# Patient Record
Sex: Female | Born: 1945 | Race: White | Hispanic: No | Smoking: Former smoker
Health system: Southern US, Community
[De-identification: ages and names within clinical notes are randomized; demographics above are authoritative.]

## PROBLEM LIST (undated history)

## (undated) DIAGNOSIS — I2510 Atherosclerotic heart disease of native coronary artery without angina pectoris: Secondary | ICD-10-CM

## (undated) DIAGNOSIS — I4891 Unspecified atrial fibrillation: Secondary | ICD-10-CM

## (undated) DIAGNOSIS — J449 Chronic obstructive pulmonary disease, unspecified: Secondary | ICD-10-CM

## (undated) HISTORY — PX: JOINT REPLACEMENT: SHX530

## (undated) HISTORY — PX: HIP SURGERY: SHX245

## (undated) HISTORY — PX: BACK SURGERY: SHX140

## (undated) HISTORY — DX: Atherosclerotic heart disease of native coronary artery without angina pectoris: I25.10

## (undated) HISTORY — DX: Chronic obstructive pulmonary disease, unspecified: J44.9

## (undated) HISTORY — DX: Unspecified atrial fibrillation: I48.91

---

## 1997-11-12 HISTORY — PX: SPINE SURGERY: SHX786

## 2004-09-07 ENCOUNTER — Ambulatory Visit: Payer: Self-pay | Admitting: Family Medicine

## 2004-11-12 LAB — HM PAP SMEAR

## 2005-11-07 ENCOUNTER — Ambulatory Visit: Payer: Self-pay | Admitting: Family Medicine

## 2005-11-12 HISTORY — PX: CORONARY STENT PLACEMENT: SHX1402

## 2006-11-12 LAB — HM COLONOSCOPY

## 2006-12-17 ENCOUNTER — Ambulatory Visit: Payer: Self-pay | Admitting: Family Medicine

## 2007-02-07 ENCOUNTER — Ambulatory Visit: Payer: Self-pay | Admitting: Family Medicine

## 2007-02-13 ENCOUNTER — Ambulatory Visit: Payer: Self-pay | Admitting: Family Medicine

## 2007-07-15 ENCOUNTER — Ambulatory Visit: Payer: Self-pay | Admitting: Specialist

## 2007-10-14 ENCOUNTER — Ambulatory Visit: Payer: Self-pay | Admitting: Gastroenterology

## 2007-11-17 ENCOUNTER — Ambulatory Visit: Payer: Self-pay | Admitting: Family Medicine

## 2007-12-22 ENCOUNTER — Ambulatory Visit: Payer: Self-pay | Admitting: Family Medicine

## 2008-01-14 ENCOUNTER — Ambulatory Visit: Payer: Self-pay | Admitting: Specialist

## 2008-01-21 ENCOUNTER — Ambulatory Visit: Payer: Self-pay | Admitting: Specialist

## 2008-02-11 ENCOUNTER — Ambulatory Visit: Payer: Self-pay | Admitting: Specialist

## 2008-02-19 ENCOUNTER — Other Ambulatory Visit: Payer: Self-pay

## 2008-02-19 ENCOUNTER — Inpatient Hospital Stay: Payer: Self-pay | Admitting: Internal Medicine

## 2008-03-09 ENCOUNTER — Ambulatory Visit: Payer: Self-pay | Admitting: Family Medicine

## 2008-03-12 ENCOUNTER — Ambulatory Visit: Payer: Self-pay | Admitting: Physician Assistant

## 2008-03-16 ENCOUNTER — Inpatient Hospital Stay: Payer: Self-pay | Admitting: Internal Medicine

## 2008-03-16 ENCOUNTER — Other Ambulatory Visit: Payer: Self-pay

## 2008-03-17 ENCOUNTER — Other Ambulatory Visit: Payer: Self-pay

## 2008-05-18 ENCOUNTER — Ambulatory Visit: Payer: Self-pay | Admitting: Specialist

## 2008-05-19 ENCOUNTER — Ambulatory Visit: Payer: Self-pay | Admitting: Vascular Surgery

## 2008-05-23 ENCOUNTER — Other Ambulatory Visit: Payer: Self-pay

## 2008-05-23 ENCOUNTER — Emergency Department: Payer: Self-pay

## 2008-06-17 ENCOUNTER — Ambulatory Visit: Payer: Self-pay | Admitting: Specialist

## 2008-07-22 ENCOUNTER — Emergency Department: Payer: Self-pay

## 2008-10-06 ENCOUNTER — Ambulatory Visit: Payer: Self-pay | Admitting: Vascular Surgery

## 2008-10-12 ENCOUNTER — Ambulatory Visit: Payer: Self-pay | Admitting: Specialist

## 2009-01-06 IMAGING — US US CAROTID DUPLEX BILAT
1 series · 14 of 24 positions shown · non-contrast
Comparison: none

REASON FOR EXAM: dizziness
COMMENTS:

[Series 1: us carotid duplex bilat · 0.07mm/px · 14 of 38 slices shown]
[im 1/38]
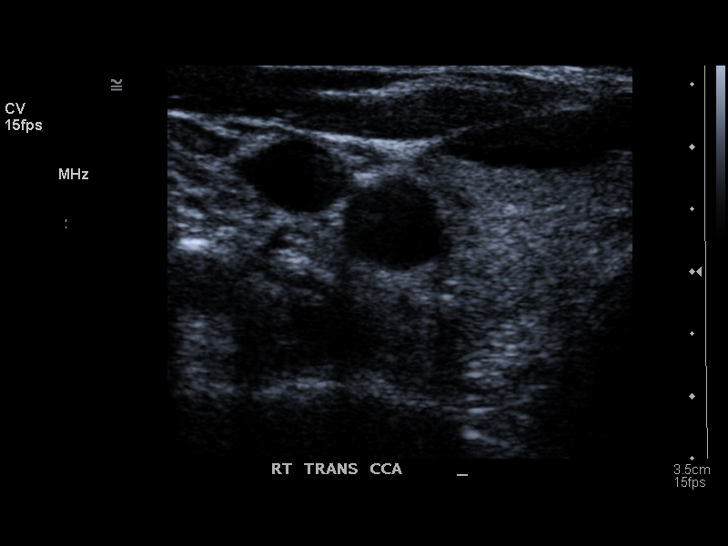
[im 4/38]
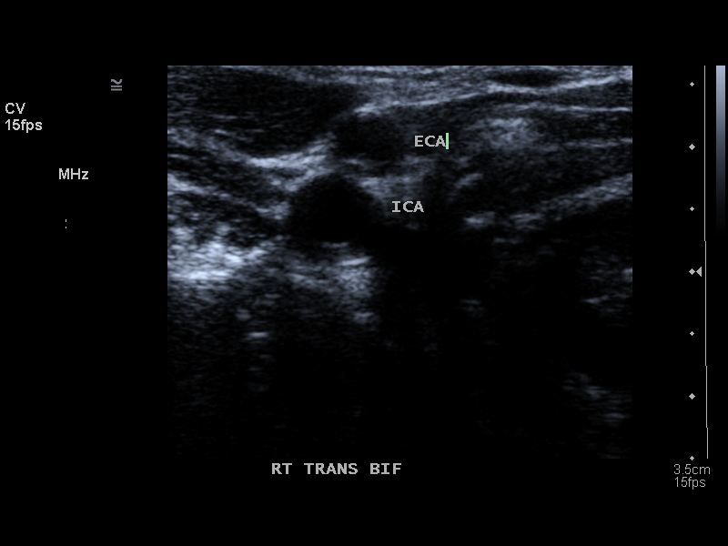
[im 7/38]
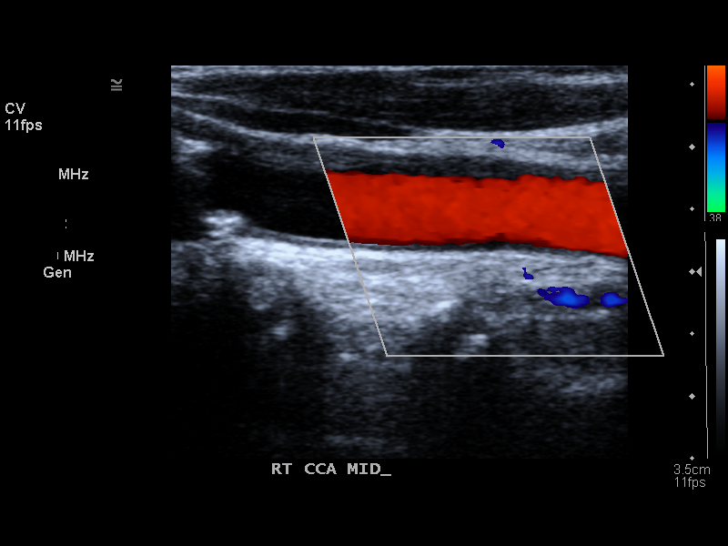
[im 10/38]
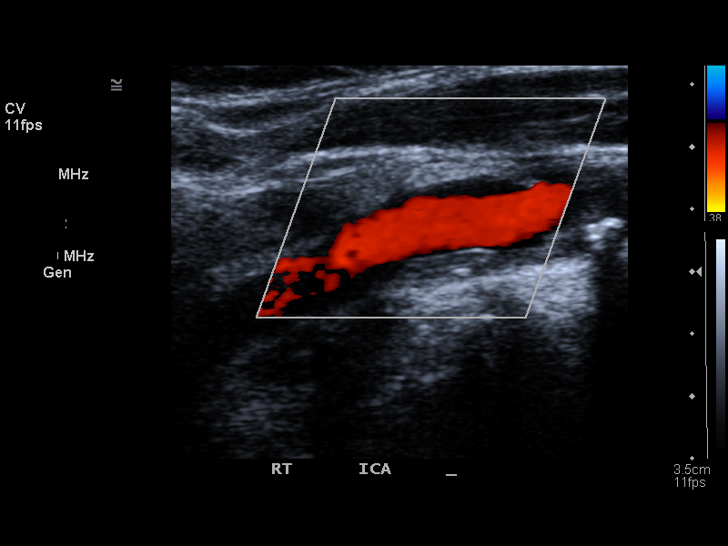
[im 12/38]
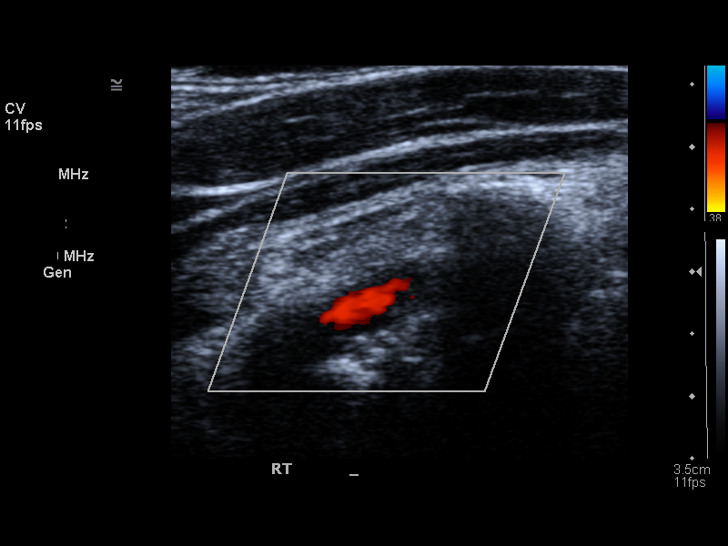
[im 15/38]
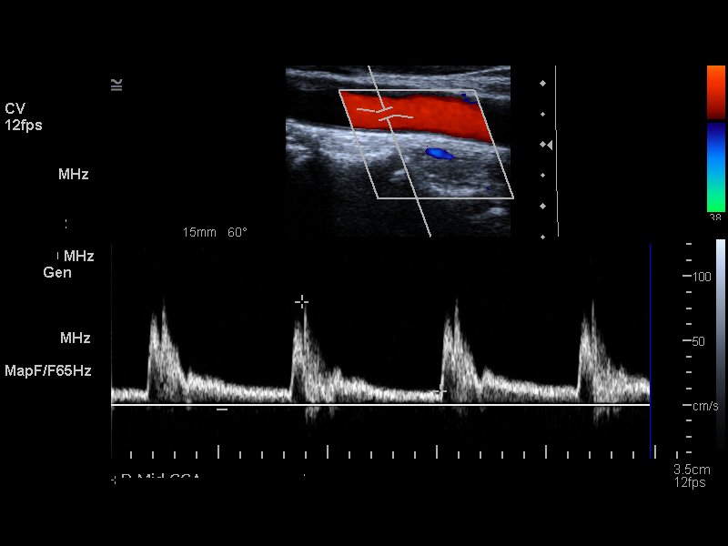
[im 18/38]
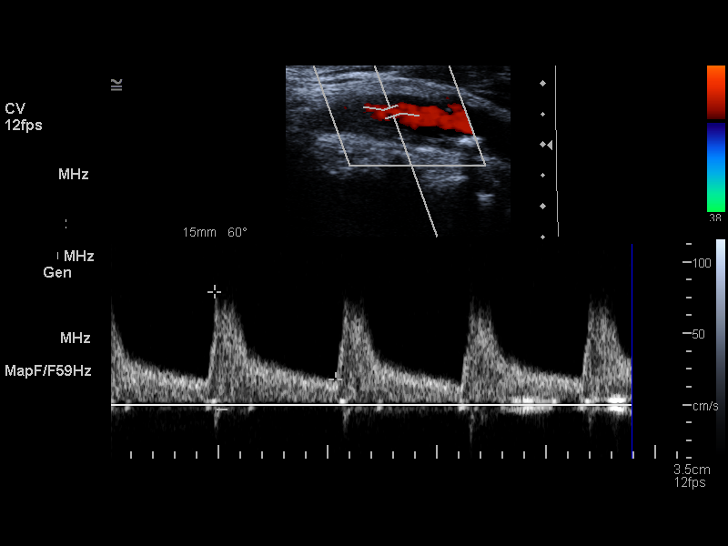
[im 20/38]
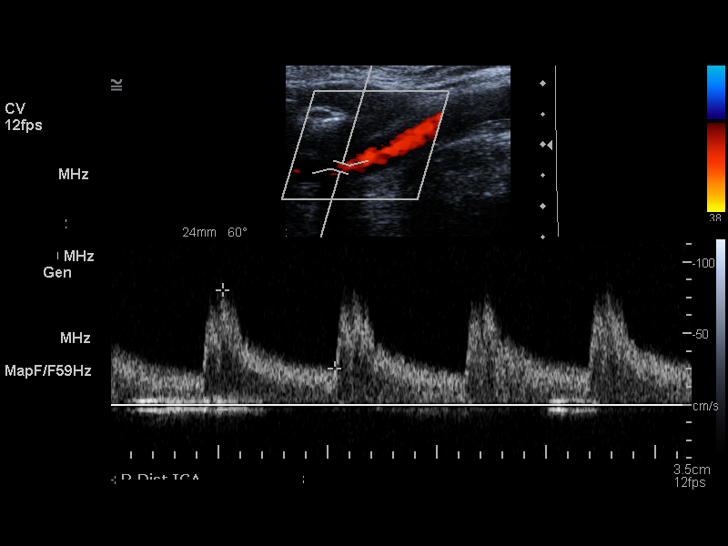
[im 23/38]
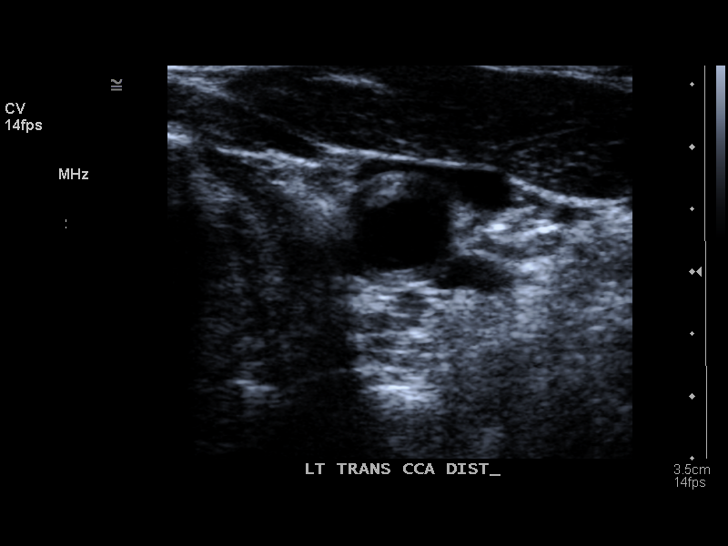
[im 26/38]
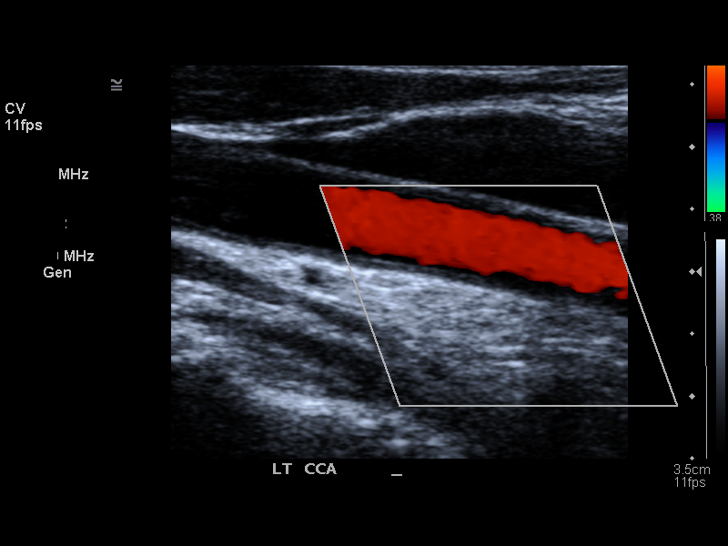
[im 29/38]
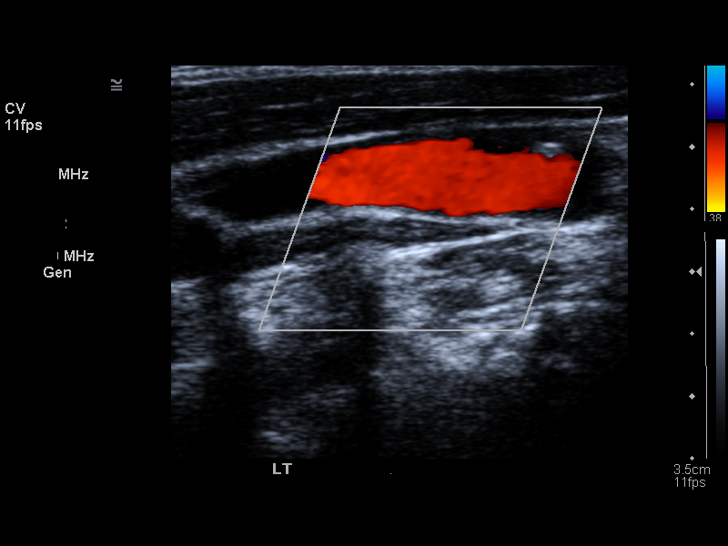
[im 31/38]
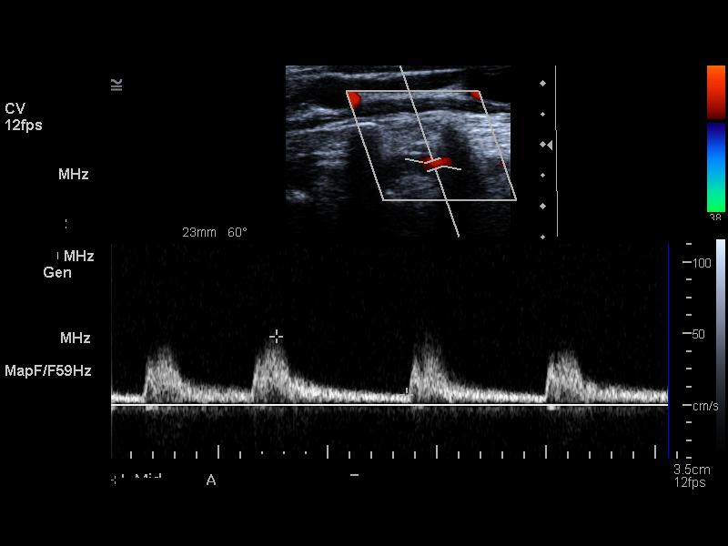
[im 34/38]
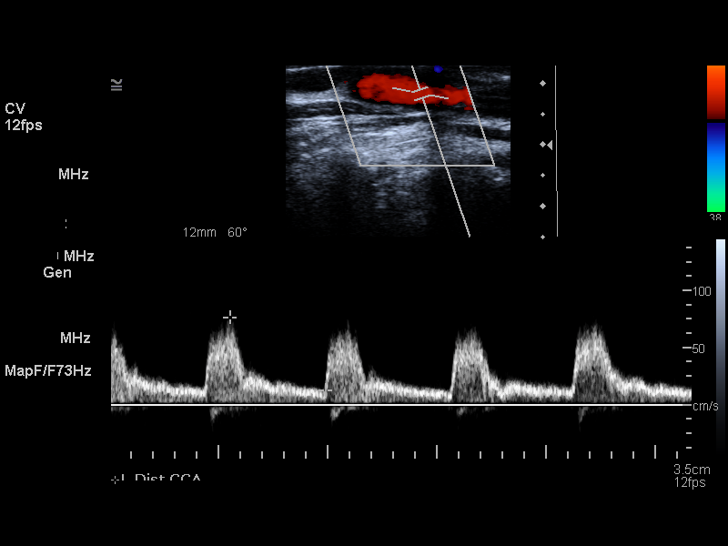
[im 38/38]
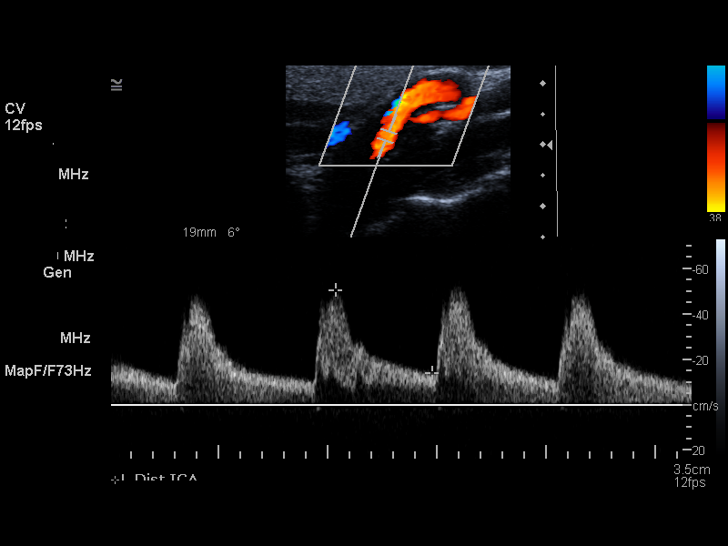

[14 of 24 positions shown; findings below may reference images not displayed]

PROCEDURE:     US  - US CAROTID DOPPLER BILATERAL  - March 09, 2008  [DATE]

RESULT:     On the RIGHT there is calcified and smooth plaque throughout the
carotid system.

On the RIGHT,  peak internal carotid systolic velocity on the RIGHT measured
81 cm/second with peak common carotid velocity of 86 cm/second corresponding
to a ratio of approximately 0.9.

On the LEFT, peak internal carotid systolic velocity measured 81 cm/second
with peak common carotid velocity measuring 102 cm/second corresponding to a
ratio of 0.8. There is a moderate amount of calcified and smooth plaque on
the LEFT in a fashion similar to that seen on the RIGHT. The vertebral
arteries are normal in flow direction bilaterally.
IMPRESSION: I see no evidence of hemodynamically significant carotid stenosis.

## 2009-04-19 ENCOUNTER — Ambulatory Visit: Payer: Self-pay | Admitting: Vascular Surgery

## 2009-09-07 ENCOUNTER — Ambulatory Visit: Payer: Self-pay | Admitting: Specialist

## 2010-02-14 ENCOUNTER — Ambulatory Visit: Payer: Self-pay | Admitting: Family Medicine

## 2010-02-15 ENCOUNTER — Ambulatory Visit: Payer: Self-pay | Admitting: Vascular Surgery

## 2010-03-20 ENCOUNTER — Ambulatory Visit: Payer: Self-pay | Admitting: Family Medicine

## 2010-09-05 ENCOUNTER — Ambulatory Visit: Payer: Self-pay | Admitting: Family Medicine

## 2012-04-26 ENCOUNTER — Ambulatory Visit: Payer: Self-pay | Admitting: Internal Medicine

## 2012-05-20 DIAGNOSIS — Z7901 Long term (current) use of anticoagulants: Secondary | ICD-10-CM | POA: Insufficient documentation

## 2012-09-09 DIAGNOSIS — A310 Pulmonary mycobacterial infection: Secondary | ICD-10-CM | POA: Insufficient documentation

## 2012-09-09 DIAGNOSIS — I739 Peripheral vascular disease, unspecified: Secondary | ICD-10-CM | POA: Insufficient documentation

## 2012-09-09 DIAGNOSIS — E871 Hypo-osmolality and hyponatremia: Secondary | ICD-10-CM | POA: Insufficient documentation

## 2012-09-09 DIAGNOSIS — D649 Anemia, unspecified: Secondary | ICD-10-CM | POA: Insufficient documentation

## 2012-09-09 DIAGNOSIS — I10 Essential (primary) hypertension: Secondary | ICD-10-CM | POA: Insufficient documentation

## 2012-09-09 DIAGNOSIS — I779 Disorder of arteries and arterioles, unspecified: Secondary | ICD-10-CM | POA: Insufficient documentation

## 2012-09-09 DIAGNOSIS — R7309 Other abnormal glucose: Secondary | ICD-10-CM | POA: Insufficient documentation

## 2012-09-09 DIAGNOSIS — I2510 Atherosclerotic heart disease of native coronary artery without angina pectoris: Secondary | ICD-10-CM | POA: Insufficient documentation

## 2012-09-09 DIAGNOSIS — J449 Chronic obstructive pulmonary disease, unspecified: Secondary | ICD-10-CM | POA: Insufficient documentation

## 2012-09-09 DIAGNOSIS — R739 Hyperglycemia, unspecified: Secondary | ICD-10-CM | POA: Insufficient documentation

## 2012-09-09 DIAGNOSIS — R69 Illness, unspecified: Secondary | ICD-10-CM | POA: Insufficient documentation

## 2012-09-09 DIAGNOSIS — Z7689 Persons encountering health services in other specified circumstances: Secondary | ICD-10-CM | POA: Insufficient documentation

## 2012-09-10 DIAGNOSIS — Z0189 Encounter for other specified special examinations: Secondary | ICD-10-CM | POA: Insufficient documentation

## 2012-09-17 ENCOUNTER — Ambulatory Visit: Payer: Self-pay | Admitting: Family Medicine

## 2012-09-23 ENCOUNTER — Ambulatory Visit: Payer: Self-pay | Admitting: Family Medicine

## 2012-09-29 DIAGNOSIS — R928 Other abnormal and inconclusive findings on diagnostic imaging of breast: Secondary | ICD-10-CM | POA: Insufficient documentation

## 2012-11-12 DIAGNOSIS — J449 Chronic obstructive pulmonary disease, unspecified: Secondary | ICD-10-CM

## 2012-11-12 HISTORY — DX: Chronic obstructive pulmonary disease, unspecified: J44.9

## 2012-12-02 ENCOUNTER — Emergency Department: Payer: Self-pay | Admitting: Emergency Medicine

## 2012-12-02 LAB — PROTIME-INR: INR: 4.9

## 2012-12-16 ENCOUNTER — Inpatient Hospital Stay: Payer: Self-pay | Admitting: Internal Medicine

## 2012-12-16 LAB — CBC
HCT: 35.1 % (ref 35.0–47.0)
HGB: 11.9 g/dL — ABNORMAL LOW (ref 12.0–16.0)
MCH: 31.3 pg (ref 26.0–34.0)
MCHC: 34 g/dL (ref 32.0–36.0)
MCV: 92 fL (ref 80–100)
Platelet: 184 10*3/uL (ref 150–440)
RBC: 3.81 10*6/uL (ref 3.80–5.20)
WBC: 12 10*3/uL — ABNORMAL HIGH (ref 3.6–11.0)

## 2012-12-16 LAB — COMPREHENSIVE METABOLIC PANEL
Albumin: 3 g/dL — ABNORMAL LOW (ref 3.4–5.0)
Alkaline Phosphatase: 100 U/L (ref 50–136)
Anion Gap: 9 (ref 7–16)
BUN: 13 mg/dL (ref 7–18)
Calcium, Total: 8.5 mg/dL (ref 8.5–10.1)
Co2: 23 mmol/L (ref 21–32)
EGFR (African American): >60
Glucose: 104 mg/dL — ABNORMAL HIGH (ref 65–99)
Osmolality: 269 (ref 275–301)
SGOT(AST): 22 U/L (ref 15–37)
SGPT (ALT): 40 U/L (ref 12–78)

## 2012-12-16 LAB — PRO B NATRIURETIC PEPTIDE: B-Type Natriuretic Peptide: 2856 pg/mL — ABNORMAL HIGH (ref 0–125)

## 2012-12-16 LAB — TROPONIN I: Troponin-I: <0.02 ng/mL

## 2012-12-16 LAB — CK TOTAL AND CKMB (NOT AT ARMC)
CK, Total: 48 U/L (ref 21–215)
CK-MB: 1.3 ng/mL (ref 0.5–3.6)

## 2012-12-16 LAB — PROTIME-INR: Prothrombin Time: 23.8 secs — ABNORMAL HIGH (ref 11.5–14.7)

## 2012-12-17 LAB — CBC WITH DIFFERENTIAL/PLATELET
Basophil #: 0 10*3/uL (ref 0.0–0.1)
Basophil %: 0.1 %
Eosinophil #: 0 10*3/uL (ref 0.0–0.7)
Eosinophil %: 0 %
HCT: 35.2 % (ref 35.0–47.0)
HGB: 11.8 g/dL — ABNORMAL LOW (ref 12.0–16.0)
Lymphocyte #: 0.6 10*3/uL — ABNORMAL LOW (ref 1.0–3.6)
Lymphocyte %: 4.3 %
MCH: 31 pg (ref 26.0–34.0)
MCHC: 33.6 g/dL (ref 32.0–36.0)
MCV: 92 fL (ref 80–100)
Monocyte #: 0.2 x10 3/mm (ref 0.2–0.9)
Monocyte %: 1.6 %
Neutrophil #: 12.9 10*3/uL — ABNORMAL HIGH (ref 1.4–6.5)
Neutrophil %: 94 %
Platelet: 176 10*3/uL (ref 150–440)
RBC: 3.82 10*6/uL (ref 3.80–5.20)
RDW: 13.7 % (ref 11.5–14.5)
WBC: 13.8 10*3/uL — ABNORMAL HIGH (ref 3.6–11.0)

## 2012-12-17 LAB — BASIC METABOLIC PANEL
Calcium, Total: 8.5 mg/dL (ref 8.5–10.1)
Chloride: 102 mmol/L (ref 98–107)
Co2: 22 mmol/L (ref 21–32)
EGFR (African American): >60
EGFR (Non-African Amer.): 58 — ABNORMAL LOW
Potassium: 4.1 mmol/L (ref 3.5–5.1)
Sodium: 134 mmol/L — ABNORMAL LOW (ref 136–145)

## 2012-12-17 LAB — MAGNESIUM: Magnesium: 2 mg/dL

## 2012-12-17 LAB — PROTIME-INR
INR: 2.1
Prothrombin Time: 23.7 secs — ABNORMAL HIGH (ref 11.5–14.7)

## 2012-12-17 LAB — PRO B NATRIURETIC PEPTIDE: B-Type Natriuretic Peptide: 3825 pg/mL — ABNORMAL HIGH (ref 0–125)

## 2012-12-18 LAB — CBC WITH DIFFERENTIAL/PLATELET
Basophil #: 0 10*3/uL (ref 0.0–0.1)
Basophil %: 0.1 %
Eosinophil #: 0 10*3/uL (ref 0.0–0.7)
Eosinophil %: 0 %
HCT: 37.5 % (ref 35.0–47.0)
HGB: 12.5 g/dL (ref 12.0–16.0)
Lymphocyte #: 0.6 10*3/uL — ABNORMAL LOW (ref 1.0–3.6)
Lymphocyte %: 2.2 %
MCH: 31.1 pg (ref 26.0–34.0)
MCV: 93 fL (ref 80–100)
Monocyte #: 0.5 x10 3/mm (ref 0.2–0.9)
Neutrophil #: 25.4 10*3/uL — ABNORMAL HIGH (ref 1.4–6.5)
Platelet: 225 10*3/uL (ref 150–440)
RBC: 4.02 10*6/uL (ref 3.80–5.20)
RDW: 13.8 % (ref 11.5–14.5)

## 2012-12-18 LAB — BASIC METABOLIC PANEL
BUN: 28 mg/dL — ABNORMAL HIGH (ref 7–18)
Calcium, Total: 8.4 mg/dL — ABNORMAL LOW (ref 8.5–10.1)
Chloride: 97 mmol/L — ABNORMAL LOW (ref 98–107)
Co2: 25 mmol/L (ref 21–32)
Creatinine: 1.12 mg/dL (ref 0.60–1.30)
EGFR (African American): 59 — ABNORMAL LOW
EGFR (Non-African Amer.): 51 — ABNORMAL LOW
Glucose: 273 mg/dL — ABNORMAL HIGH (ref 65–99)
Osmolality: 283 (ref 275–301)
Potassium: 3.5 mmol/L (ref 3.5–5.1)

## 2012-12-18 LAB — PROTIME-INR
INR: 1.9
Prothrombin Time: 21.8 secs — ABNORMAL HIGH (ref 11.5–14.7)

## 2012-12-18 LAB — HEMOGLOBIN A1C: Hemoglobin A1C: 7.2 % — ABNORMAL HIGH (ref 4.2–6.3)

## 2012-12-19 LAB — CBC WITH DIFFERENTIAL/PLATELET
Basophil #: 0 10*3/uL (ref 0.0–0.1)
HGB: 13.1 g/dL (ref 12.0–16.0)
Lymphocyte %: 2.4 %
MCH: 30.3 pg (ref 26.0–34.0)
MCHC: 32.8 g/dL (ref 32.0–36.0)
MCV: 93 fL (ref 80–100)
Monocyte #: 0.3 x10 3/mm (ref 0.2–0.9)
Monocyte %: 1.6 %
Neutrophil #: 20.4 10*3/uL — ABNORMAL HIGH (ref 1.4–6.5)
Platelet: 241 10*3/uL (ref 150–440)
RBC: 4.33 10*6/uL (ref 3.80–5.20)
WBC: 21.2 10*3/uL — ABNORMAL HIGH (ref 3.6–11.0)

## 2012-12-19 LAB — BASIC METABOLIC PANEL
Anion Gap: 8 (ref 7–16)
Calcium, Total: 8.3 mg/dL — ABNORMAL LOW (ref 8.5–10.1)
Chloride: 97 mmol/L — ABNORMAL LOW (ref 98–107)
Co2: 27 mmol/L (ref 21–32)
Creatinine: 0.83 mg/dL (ref 0.60–1.30)
EGFR (Non-African Amer.): >60
Glucose: 209 mg/dL — ABNORMAL HIGH (ref 65–99)
Osmolality: 275 (ref 275–301)
Potassium: 3.8 mmol/L (ref 3.5–5.1)
Sodium: 132 mmol/L — ABNORMAL LOW (ref 136–145)

## 2012-12-19 LAB — PROTIME-INR: INR: 1.8

## 2012-12-22 LAB — CULTURE, BLOOD (SINGLE)

## 2013-03-24 ENCOUNTER — Ambulatory Visit: Payer: Self-pay | Admitting: Family Medicine

## 2013-04-02 ENCOUNTER — Encounter: Payer: Self-pay | Admitting: General Surgery

## 2013-04-02 ENCOUNTER — Ambulatory Visit (INDEPENDENT_AMBULATORY_CARE_PROVIDER_SITE_OTHER): Payer: Medicare Other | Admitting: General Surgery

## 2013-04-02 VITALS — BP 140/72 | HR 76 | Resp 14 | Ht 61.0 in | Wt 174.0 lb

## 2013-04-02 DIAGNOSIS — Z803 Family history of malignant neoplasm of breast: Secondary | ICD-10-CM

## 2013-04-02 DIAGNOSIS — N63 Unspecified lump in unspecified breast: Secondary | ICD-10-CM | POA: Insufficient documentation

## 2013-04-02 NOTE — Progress Notes (Signed)
Patient ID: [name], female   DOB: 1946-10-30, 67 y.o.   MRN: [id number]  Chief Complaint  Patient presents with  . Breast Problem    abnormal mammogram    HPI [name] is a 67 y.o. female who presents for an abnormal mammogram. The most recent mammogram was done on 03/24/13 with a birad category 4. A left breast ultrasound was also performed at this time. The patient occasionally does self breast checks but does get regular mammograms. She has a sister who was diagnosed in her 97's with breast cancer. No complaints at this time. No prior problems with her breasts in the past.  HPI  Past Medical History  Diagnosis Date  . COPD (chronic obstructive pulmonary disease) Jan 2014  . A-fib   . Coronary artery disease     Past Surgical History  Procedure Laterality Date  . Spine surgery  1999  . Joint replacement  1994, 1996, 2000    hip  . Coronary stent placement  2007    Family History  Problem Relation Age of Onset  . Heart failure Father   . Stroke Mother   . Cancer Sister 79    breast    Social History History  Substance Use Topics  . Smoking status: Former Games developer  . Smokeless tobacco: Not on file  . Alcohol Use: No    Allergies  Allergen Reactions  . Penicillins Rash    Current Outpatient Prescriptions  Medication Sig Dispense Refill  . albuterol (PROVENTIL) (2.5 MG/3ML) 0.083% nebulizer solution Take 2.5 mg by nebulization every 6 (six) hours as needed for wheezing.      Marland Kitchen CARTIA XT 120 MG 24 hr capsule 120 mg daily.       . furosemide (LASIX) 20 MG tablet daily.       Marland Kitchen lisinopril (PRINIVIL,ZESTRIL) 40 MG tablet daily.       . metoprolol (LOPRESSOR) 100 MG tablet 100 mg 2 (two) times daily.       Docia Barrier IN Inhale into the lungs. 2 liters of oxygen.      . potassium chloride SA (K-DUR,KLOR-CON) 20 MEQ tablet 20 mEq 2 (two) times daily.       . pravastatin (PRAVACHOL) 80 MG tablet 80 mg daily.       Marland Kitchen SPIRIVA HANDIHALER 18 MCG inhalation capsule        . warfarin (COUMADIN) 2 MG tablet Take 2 mg by mouth 4 (four) times a week.      . warfarin (COUMADIN) 3 MG tablet Take 3 mg by mouth 3 (three) times a week.       No current facility-administered medications for this visit.    Review of Systems Review of Systems  Constitutional: Negative.   Respiratory: Negative.   Cardiovascular: Negative.     Blood pressure 140/72, pulse 76, resp. rate 14, height 5\' 1"  (1.549 m), weight 174 lb (78.926 kg).  Physical Exam Physical Exam  Constitutional: She appears well-developed and well-nourished.  Eyes: Conjunctivae are normal. No scleral icterus.  Neck: Trachea normal. No mass and no thyromegaly present.  Cardiovascular: Normal rate, regular rhythm, normal heart sounds and normal pulses.   No murmur heard. Pulmonary/Chest: Effort normal and breath sounds normal. Right breast exhibits no inverted nipple, no mass, no nipple discharge, no skin change and no tenderness. Left breast exhibits no inverted nipple, no mass, no nipple discharge, no skin change and no tenderness. Breasts are symmetrical.  Abdominal: Soft. Normal appearance. There is no hepatosplenomegaly. There  is no tenderness.  Lymphadenopathy:    She has no cervical adenopathy.    She has no axillary adenopathy.    Data Reviewed Mammogram and ultrasound reviewed showing a 4-5 mm left retroareolar mass at 5 o'cl which has become more prominent compared to prior studies.   Assessment    Feel core biopsy is reasonable at this time. Discussed with patient and she is agreeable.      Plan    Stop Coumadin 5 days prior to procedure.         Fronnie Urton G 04/02/2013, 10:08 AM

## 2013-04-02 NOTE — Patient Instructions (Addendum)
Patient to return for left breast biopsy. The patient is advised to stop Coumadin 5 days prior to biopsy being done.

## 2013-04-16 ENCOUNTER — Ambulatory Visit (INDEPENDENT_AMBULATORY_CARE_PROVIDER_SITE_OTHER): Payer: Medicare Other | Admitting: General Surgery

## 2013-04-16 ENCOUNTER — Encounter: Payer: Self-pay | Admitting: General Surgery

## 2013-04-16 VITALS — BP 130/70 | HR 70 | Resp 16 | Ht 61.0 in | Wt 191.0 lb

## 2013-04-16 DIAGNOSIS — N63 Unspecified lump in unspecified breast: Secondary | ICD-10-CM

## 2013-04-16 NOTE — Patient Instructions (Addendum)
CARE AFTER BREAST BIOPSY  1. Leave the dressing on that your doctor applied after surgery. It is waterproof. You may bathe, shower and/or swim. The dressing will probably remain intact until your return office visit. If the dressing comes off, you will see small strips of tape against your skin on the incision. Do not remove these strips.  2. You may want to use a gauze,cloth or similar protection in your bra to prevent rubbing against your dressing and incision. This is not necessary, but you may feel more comfortable doing so.  3. It is recommended that you wear a bra day and night to give support to the breast. This will prevent the weight of the breast from pulling on the incision.  4. Your breast will feel hard and lumpy under the incision. Do not be alarmed. This is the underlying stitching of tissue. Softening of this tissue will occur in time.  5. Make sure you call the office and schedule an appointment in one week after your surgery. The office phone number is [phone]. The [facility] may have already done this for you.  6. You will notice about a week after your office visit that the strips of the tape on your incision will begin to loosen. These may then be removed.  7. Report to your doctor any of the following:  * Severe pain not relieved by your pain medication  *Redness of the incision  * Drainage from the incision  *Fever greater than 101 degrees  restart coumadin tomorrow

## 2013-04-16 NOTE — Progress Notes (Signed)
Patient ID: [name], female   DOB: 1945-12-12, 67 y.o.   MRN: [id number]  Chief Complaint  Patient presents with  . Procedure    left breast biopsy    HPI [name] is a 67 y.o. female here today for an left breast biopsy. She states she did stop her coumadin as instructed. HPI  Past Medical History  Diagnosis Date  . COPD (chronic obstructive pulmonary disease) Jan 2014  . A-fib   . Coronary artery disease     Past Surgical History  Procedure Laterality Date  . Spine surgery  1999  . Joint replacement  1994, 1996, 2000    hip  . Coronary stent placement  2007    Family History  Problem Relation Age of Onset  . Heart failure Father   . Stroke Mother   . Cancer Sister 49    breast    Social History History  Substance Use Topics  . Smoking status: Former Games developer  . Smokeless tobacco: Not on file  . Alcohol Use: No    Allergies  Allergen Reactions  . Penicillins Rash    Current Outpatient Prescriptions  Medication Sig Dispense Refill  . albuterol (PROVENTIL) (2.5 MG/3ML) 0.083% nebulizer solution Take 2.5 mg by nebulization every 6 (six) hours as needed for wheezing.      Marland Kitchen CARTIA XT 120 MG 24 hr capsule 120 mg daily.       . furosemide (LASIX) 20 MG tablet daily.       Marland Kitchen lisinopril (PRINIVIL,ZESTRIL) 40 MG tablet daily.       . metoprolol (LOPRESSOR) 100 MG tablet 100 mg 2 (two) times daily.       Docia Barrier IN Inhale into the lungs. 2 liters of oxygen.      . potassium chloride SA (K-DUR,KLOR-CON) 20 MEQ tablet 20 mEq 2 (two) times daily.       . pravastatin (PRAVACHOL) 80 MG tablet 80 mg daily.       Marland Kitchen SPIRIVA HANDIHALER 18 MCG inhalation capsule       . warfarin (COUMADIN) 2 MG tablet Take 2 mg by mouth 4 (four) times a week.      . warfarin (COUMADIN) 3 MG tablet Take 3 mg by mouth 3 (three) times a week.       No current facility-administered medications for this visit.    Review of Systems Review of Systems  Constitutional: Negative.    Respiratory: Negative.   Cardiovascular: Negative.     Blood pressure 130/70, pulse 70, resp. rate 16, height 5\' 1"  (1.549 m), weight 191 lb (86.637 kg).  Physical Exam Physical Exam  Data Reviewed    Assessment    Left breat mass     Plan    Core biopsy completed        Gabrielle Mester G 04/16/2013, 8:44 AM

## 2013-04-17 LAB — PATHOLOGY

## 2013-04-21 ENCOUNTER — Telehealth: Payer: Self-pay | Admitting: *Deleted

## 2013-04-21 NOTE — Telephone Encounter (Signed)
Patient was notified as instructed. She has an appointment to return in August 2014.

## 2013-04-21 NOTE — Telephone Encounter (Signed)
Message copied by Nicholes Mango on Tue Apr 21, 2013  8:15 AM ------      Message from: Kieth Brightly      Created: Mon Apr 20, 2013  5:53 PM       Please let pt pt know the pathology was normal. 2 mo f/u for left breast US ------

## 2013-04-22 ENCOUNTER — Encounter: Payer: Self-pay | Admitting: General Surgery

## 2013-06-23 ENCOUNTER — Ambulatory Visit (INDEPENDENT_AMBULATORY_CARE_PROVIDER_SITE_OTHER): Payer: Medicare Other | Admitting: General Surgery

## 2013-06-23 ENCOUNTER — Encounter: Payer: Self-pay | Admitting: General Surgery

## 2013-06-23 ENCOUNTER — Other Ambulatory Visit: Payer: Self-pay

## 2013-06-23 VITALS — BP 164/90 | HR 82 | Resp 18 | Ht 62.0 in | Wt 182.0 lb

## 2013-06-23 DIAGNOSIS — N63 Unspecified lump in unspecified breast: Secondary | ICD-10-CM

## 2013-06-23 NOTE — Progress Notes (Signed)
Patient ID: [name], female   DOB: [birth year], 67 y.o.   MRN: [id number]  Chief Complaint  Patient presents with  . Follow-up    2 month follow up left breast ultrasound.     HPI [name] is a 67 y.o. female who presents for a 2 month follow up left breast ultrasound. She had core biopsy of a small right retro areolar mass at 5 o'cl location The patient denies any new problems with the breasts at this time. The   biopsy done on 04/16/13   was benign.    HPI  Past Medical History  Diagnosis Date  . COPD (chronic obstructive pulmonary disease) Jan 2014  . A-fib   . Coronary artery disease     Past Surgical History  Procedure Laterality Date  . Spine surgery  1999  . Joint replacement  1994, 1996, 2000    hip  . Coronary stent placement  2007    Family History  Problem Relation Age of Onset  . Heart failure Father   . Stroke Mother   . Cancer Sister 54    breast    Social History History  Substance Use Topics  . Smoking status: Former Games developer  . Smokeless tobacco: Not on file  . Alcohol Use: No    Allergies  Allergen Reactions  . Penicillins Rash    Current Outpatient Prescriptions  Medication Sig Dispense Refill  . albuterol (PROVENTIL) (2.5 MG/3ML) 0.083% nebulizer solution Take 2.5 mg by nebulization every 6 (six) hours as needed for wheezing.      Marland Kitchen CARTIA XT 120 MG 24 hr capsule 120 mg daily.       . furosemide (LASIX) 20 MG tablet daily.       Marland Kitchen lisinopril (PRINIVIL,ZESTRIL) 40 MG tablet daily.       . metoprolol (LOPRESSOR) 100 MG tablet 100 mg 2 (two) times daily.       Docia Barrier IN Inhale into the lungs. 2 liters of oxygen.      . potassium chloride SA (K-DUR,KLOR-CON) 20 MEQ tablet 20 mEq 2 (two) times daily.       . pravastatin (PRAVACHOL) 80 MG tablet 80 mg daily.       Marland Kitchen SPIRIVA HANDIHALER 18 MCG inhalation capsule       . warfarin (COUMADIN) 2 MG tablet Take 2 mg by mouth 4 (four) times a week.      . warfarin (COUMADIN) 3 MG tablet Take 3  mg by mouth 3 (three) times a week.       No current facility-administered medications for this visit.    Review of Systems Review of Systems  Constitutional: Negative.   Respiratory: Negative.   Cardiovascular: Negative.     Blood pressure 164/90, pulse 82, resp. rate 18, height 5\' 2"  (1.575 m), weight 182 lb (82.555 kg).  Physical Exam Physical Exam  Constitutional: She appears well-developed and well-nourished.  Neck: No mass and no thyromegaly present.  Pulmonary/Chest: Left breast exhibits no inverted nipple, no mass, no nipple discharge, no skin change and no tenderness.  Lymphadenopathy:    She has no cervical adenopathy.    She has no axillary adenopathy.    Data Reviewed Ultrasound today showed no mass 5:00 retroareolar left   Assessment    Benign left breast mass.    Plan    Follow up in November 2014 left breast diagnostic mammogram.       Kieth Brightly 06/24/2013, 5:58 PM

## 2013-06-23 NOTE — Patient Instructions (Addendum)
Patient to return in November 2014 with left breast diagnostic mammogram.

## 2013-06-24 ENCOUNTER — Encounter: Payer: Self-pay | Admitting: General Surgery

## 2013-09-02 DIAGNOSIS — N63 Unspecified lump in unspecified breast: Secondary | ICD-10-CM | POA: Insufficient documentation

## 2013-09-12 LAB — HM MAMMOGRAPHY

## 2013-10-06 ENCOUNTER — Ambulatory Visit: Payer: Self-pay | Admitting: General Surgery

## 2013-10-06 ENCOUNTER — Encounter: Payer: Self-pay | Admitting: General Surgery

## 2013-10-15 ENCOUNTER — Ambulatory Visit (INDEPENDENT_AMBULATORY_CARE_PROVIDER_SITE_OTHER): Payer: Medicare Other | Admitting: General Surgery

## 2013-10-15 ENCOUNTER — Encounter: Payer: Self-pay | Admitting: General Surgery

## 2013-10-15 VITALS — BP 134/72 | HR 82 | Resp 16 | Ht 62.0 in | Wt 188.0 lb

## 2013-10-15 DIAGNOSIS — N63 Unspecified lump in unspecified breast: Secondary | ICD-10-CM

## 2013-10-15 DIAGNOSIS — Z803 Family history of malignant neoplasm of breast: Secondary | ICD-10-CM

## 2013-10-15 NOTE — Progress Notes (Signed)
Patient ID: [name], female   DOB: [birth year], 67 y.o.   MRN: [id number]  Chief Complaint  Patient presents with  . Follow-up    6 month follow up left diagnostic mammogram.     HPI [name] is a 67 y.o. female who presents for a breast evaluation. The most recent mammogram was done on 10/06/13. Patient does perform regular self breast checks and gets regular mammograms done.  The patient denies any problems with her breasts at this time. She had a core biopsy of a tiny left retroareolar nodulefew mos ago-benign  HPI  Past Medical History  Diagnosis Date  . COPD (chronic obstructive pulmonary disease) Jan 2014  . A-fib   . Coronary artery disease     Past Surgical History  Procedure Laterality Date  . Spine surgery  1999  . Joint replacement  1994, 1996, 2000    hip  . Coronary stent placement  2007    Family History  Problem Relation Age of Onset  . Heart failure Father   . Stroke Mother   . Cancer Sister 36    breast    Social History History  Substance Use Topics  . Smoking status: Former Games developer  . Smokeless tobacco: Not on file  . Alcohol Use: No    Allergies  Allergen Reactions  . Penicillins Rash    Current Outpatient Prescriptions  Medication Sig Dispense Refill  . albuterol (PROVENTIL) (2.5 MG/3ML) 0.083% nebulizer solution Take 2.5 mg by nebulization every 6 (six) hours as needed for wheezing.      Marland Kitchen CARTIA XT 120 MG 24 hr capsule 120 mg daily.       . furosemide (LASIX) 20 MG tablet daily.       Marland Kitchen lisinopril (PRINIVIL,ZESTRIL) 40 MG tablet daily.       . metoprolol (LOPRESSOR) 100 MG tablet 100 mg 2 (two) times daily.       Docia Barrier IN Inhale into the lungs. 2 liters of oxygen.      . potassium chloride SA (K-DUR,KLOR-CON) 20 MEQ tablet 20 mEq 2 (two) times daily.       . pravastatin (PRAVACHOL) 80 MG tablet 80 mg daily.       Marland Kitchen SPIRIVA HANDIHALER 18 MCG inhalation capsule       . warfarin (COUMADIN) 2 MG tablet Take 2 mg by mouth 4  (four) times a week.      . warfarin (COUMADIN) 3 MG tablet Take 3 mg by mouth 3 (three) times a week.       No current facility-administered medications for this visit.    Review of Systems Review of Systems  Constitutional: Negative.   Respiratory: Negative.   Cardiovascular: Negative.     Blood pressure 134/72, pulse 82, resp. rate 16, height 5\' 2"  (1.575 m), weight 188 lb (85.276 kg).  Physical Exam Physical Exam  Constitutional: She is oriented to person, place, and time. She appears well-developed and well-nourished.  Eyes: Conjunctivae are normal. No scleral icterus.  Neck: No mass and no thyromegaly present.  Cardiovascular: Normal rate, regular rhythm and normal heart sounds.   Pulmonary/Chest: Breath sounds normal. Right breast exhibits no inverted nipple, no mass, no nipple discharge, no skin change and no tenderness. Left breast exhibits no inverted nipple, no mass, no nipple discharge, no skin change and no tenderness. Breasts are symmetrical.  Lymphadenopathy:    She has no cervical adenopathy.    She has no axillary adenopathy.  Neurological: She is alert and  oriented to person, place, and time.  Skin: Skin is warm and intact.    Data Reviewed Mammogram shows left retroareolar nodule to be smaller with adjacent clip.  Assessment    Benign left breast nodule. FH of breast cancer. Exam stable.     Plan    Return with yearly mammogram.         [name] 10/15/2013, 10:26 AM

## 2013-10-15 NOTE — Patient Instructions (Signed)
Continue monthly self breast exams and call with any problems

## 2014-02-20 ENCOUNTER — Inpatient Hospital Stay: Payer: Self-pay | Admitting: Specialist

## 2014-02-20 LAB — COMPREHENSIVE METABOLIC PANEL
ALK PHOS: 91 U/L
ANION GAP: 3 — AB (ref 7–16)
Albumin: 3.4 g/dL (ref 3.4–5.0)
BILIRUBIN TOTAL: 0.5 mg/dL (ref 0.2–1.0)
BUN: 13 mg/dL (ref 7–18)
CALCIUM: 8 mg/dL — AB (ref 8.5–10.1)
CHLORIDE: 92 mmol/L — AB (ref 98–107)
CREATININE: 0.7 mg/dL (ref 0.60–1.30)
Co2: 34 mmol/L — ABNORMAL HIGH (ref 21–32)
EGFR (African American): >60
EGFR (Non-African Amer.): >60
GLUCOSE: 131 mg/dL — AB (ref 65–99)
Osmolality: 261 (ref 275–301)
POTASSIUM: 3.8 mmol/L (ref 3.5–5.1)
SGOT(AST): 29 U/L (ref 15–37)
SGPT (ALT): 30 U/L (ref 12–78)
Sodium: 129 mmol/L — ABNORMAL LOW (ref 136–145)
TOTAL PROTEIN: 7.6 g/dL (ref 6.4–8.2)

## 2014-02-20 LAB — CBC WITH DIFFERENTIAL/PLATELET
BASOS PCT: 0.9 %
Basophil #: 0.1 10*3/uL (ref 0.0–0.1)
EOS PCT: 0.8 %
Eosinophil #: 0.1 10*3/uL (ref 0.0–0.7)
HCT: 40.5 % (ref 35.0–47.0)
HGB: 12.9 g/dL (ref 12.0–16.0)
Lymphocyte #: 1.3 10*3/uL (ref 1.0–3.6)
Lymphocyte %: 10.3 %
MCH: 30.1 pg (ref 26.0–34.0)
MCHC: 31.9 g/dL — ABNORMAL LOW (ref 32.0–36.0)
MCV: 94 fL (ref 80–100)
MONO ABS: 0.9 x10 3/mm (ref 0.2–0.9)
MONOS PCT: 6.9 %
NEUTROS ABS: 10.6 10*3/uL — AB (ref 1.4–6.5)
Neutrophil %: 81.1 %
Platelet: 176 10*3/uL (ref 150–440)
RBC: 4.3 10*6/uL (ref 3.80–5.20)
RDW: 13.3 % (ref 11.5–14.5)
WBC: 13 10*3/uL — ABNORMAL HIGH (ref 3.6–11.0)

## 2014-02-20 LAB — PROTIME-INR
INR: 2.1
Prothrombin Time: 22.8 secs — ABNORMAL HIGH (ref 11.5–14.7)

## 2014-02-20 LAB — MAGNESIUM: MAGNESIUM: 1.1 mg/dL — AB

## 2014-02-20 LAB — TROPONIN I: Troponin-I: <0.02 ng/mL

## 2014-02-20 LAB — PHOSPHORUS: Phosphorus: 2.6 mg/dL (ref 2.5–4.9)

## 2014-02-20 LAB — PRO B NATRIURETIC PEPTIDE: B-Type Natriuretic Peptide: 2214 pg/mL — ABNORMAL HIGH (ref 0–125)

## 2014-02-21 LAB — BASIC METABOLIC PANEL
Anion Gap: 5 — ABNORMAL LOW (ref 7–16)
BUN: 12 mg/dL (ref 7–18)
Calcium, Total: 7.7 mg/dL — ABNORMAL LOW (ref 8.5–10.1)
Chloride: 88 mmol/L — ABNORMAL LOW (ref 98–107)
Co2: 32 mmol/L (ref 21–32)
Creatinine: 0.56 mg/dL — ABNORMAL LOW (ref 0.60–1.30)
EGFR (African American): >60
GLUCOSE: 183 mg/dL — AB (ref 65–99)
OSMOLALITY: 256 (ref 275–301)
POTASSIUM: 4.1 mmol/L (ref 3.5–5.1)
Sodium: 125 mmol/L — ABNORMAL LOW (ref 136–145)

## 2014-02-21 LAB — CBC WITH DIFFERENTIAL/PLATELET
BASOS PCT: 0.1 %
Basophil #: 0 10*3/uL (ref 0.0–0.1)
Eosinophil #: 0 10*3/uL (ref 0.0–0.7)
Eosinophil %: 0 %
HCT: 38.7 % (ref 35.0–47.0)
HGB: 13.3 g/dL (ref 12.0–16.0)
Lymphocyte #: 0.5 10*3/uL — ABNORMAL LOW (ref 1.0–3.6)
Lymphocyte %: 4.1 %
MCH: 31.8 pg (ref 26.0–34.0)
MCHC: 34.2 g/dL (ref 32.0–36.0)
MCV: 93 fL (ref 80–100)
MONO ABS: 0.1 x10 3/mm — AB (ref 0.2–0.9)
MONOS PCT: 1.1 %
Neutrophil #: 12.4 10*3/uL — ABNORMAL HIGH (ref 1.4–6.5)
Neutrophil %: 94.7 %
Platelet: 184 10*3/uL (ref 150–440)
RBC: 4.16 10*6/uL (ref 3.80–5.20)
RDW: 13.5 % (ref 11.5–14.5)
WBC: 13 10*3/uL — ABNORMAL HIGH (ref 3.6–11.0)

## 2014-02-21 LAB — LIPID PANEL
CHOLESTEROL: 108 mg/dL (ref 0–200)
HDL Cholesterol: 43 mg/dL (ref 40–60)
LDL CHOLESTEROL, CALC: 54 mg/dL (ref 0–100)
TRIGLYCERIDES: 53 mg/dL (ref 0–200)
VLDL CHOLESTEROL, CALC: 11 mg/dL (ref 5–40)

## 2014-02-21 LAB — PROTIME-INR
INR: 1.9
PROTHROMBIN TIME: 21 s — AB (ref 11.5–14.7)

## 2014-02-21 LAB — URINALYSIS, COMPLETE
Bilirubin,UR: NEGATIVE
Blood: NEGATIVE
Glucose,UR: 50 mg/dL (ref 0–75)
Ketone: NEGATIVE
Leukocyte Esterase: NEGATIVE
Nitrite: NEGATIVE
Ph: 6 (ref 4.5–8.0)
RBC,UR: 8 /HPF (ref 0–5)
SPECIFIC GRAVITY: 1.008 (ref 1.003–1.030)
WBC UR: 4 /HPF (ref 0–5)

## 2014-02-21 LAB — MAGNESIUM: MAGNESIUM: 2.1 mg/dL

## 2014-02-21 LAB — TROPONIN I: Troponin-I: <0.02 ng/mL

## 2014-02-22 LAB — PROTIME-INR
INR: 2.4
Prothrombin Time: 25.6 secs — ABNORMAL HIGH (ref 11.5–14.7)

## 2014-02-22 LAB — SODIUM: SODIUM: 131 mmol/L — AB (ref 136–145)

## 2014-02-23 LAB — PROTIME-INR
INR: 2.9
PROTHROMBIN TIME: 29.7 s — AB (ref 11.5–14.7)

## 2014-02-25 LAB — CULTURE, BLOOD (SINGLE)

## 2014-03-08 ENCOUNTER — Ambulatory Visit: Payer: Self-pay | Admitting: Family Medicine

## 2014-03-12 ENCOUNTER — Ambulatory Visit: Payer: Self-pay | Admitting: Family Medicine

## 2014-09-13 ENCOUNTER — Encounter: Payer: Self-pay | Admitting: General Surgery

## 2014-10-11 ENCOUNTER — Ambulatory Visit: Payer: Self-pay | Admitting: General Surgery

## 2014-10-12 ENCOUNTER — Encounter: Payer: Self-pay | Admitting: General Surgery

## 2014-10-19 ENCOUNTER — Encounter: Payer: Self-pay | Admitting: General Surgery

## 2014-10-19 ENCOUNTER — Ambulatory Visit (INDEPENDENT_AMBULATORY_CARE_PROVIDER_SITE_OTHER): Payer: Medicare Other | Admitting: General Surgery

## 2014-10-19 VITALS — BP 142/68 | HR 86 | Resp 18 | Ht 62.0 in | Wt 164.0 lb

## 2014-10-19 DIAGNOSIS — Z803 Family history of malignant neoplasm of breast: Secondary | ICD-10-CM

## 2014-10-19 DIAGNOSIS — N63 Unspecified lump in unspecified breast: Secondary | ICD-10-CM

## 2014-10-19 NOTE — Patient Instructions (Addendum)
Continue self breast exams. Call office for any new breast issues or concerns. 

## 2014-10-19 NOTE — Progress Notes (Signed)
Patient ID: [name], female   DOB: May 27, 1946, 68 y.o.   MRN: [id number]  Chief Complaint  Patient presents with  . Follow-up    mammogram    HPI Savannah Faithda H Dipinto is a 68 y.o. female who presents for a breast evaluation. The most recent mammogram was done on 10/11/14. Patient does perform regular self breast checks and gets regular mammograms done.  The patient denies any new problems with the breasts at this time.    HPI  Past Medical History  Diagnosis Date  . COPD (chronic obstructive pulmonary disease) Jan 2014  . A-fib   . Coronary artery disease     Past Surgical History  Procedure Laterality Date  . Spine surgery  1999  . Joint replacement  1994, 1996, 2000    hip  . Coronary stent placement  2007    Family History  Problem Relation Age of Onset  . Heart failure Father   . Stroke Mother   . Cancer Sister [id number]    breast    Social History History  Substance Use Topics  . Smoking status: Former Games developermoker  . Smokeless tobacco: Not on file  . Alcohol Use: No    Allergies  Allergen Reactions  . Penicillins Rash    Current Outpatient Prescriptions  Medication Sig Dispense Refill  . ACCU-CHEK AVIVA PLUS test strip     . albuterol (PROVENTIL) (2.5 MG/3ML) 0.083% nebulizer solution Take 2.5 mg by nebulization every 6 (six) hours as needed for wheezing.    Marland Kitchen. CARTIA XT 120 MG 24 hr capsule 120 mg daily.     . furosemide (LASIX) 20 MG tablet daily.     Marland Kitchen. lisinopril (PRINIVIL,ZESTRIL) 40 MG tablet daily.     . metFORMIN (GLUCOPHAGE) 500 MG tablet     . metoprolol (LOPRESSOR) 100 MG tablet 100 mg 2 (two) times daily.     . metoprolol succinate (TOPROL-XL) 100 MG 24 hr tablet     . OXYGEN-HELIUM IN Inhale into the lungs. 2 liters of oxygen.    . potassium chloride SA (K-DUR,KLOR-CON) 20 MEQ tablet 20 mEq 2 (two) times daily.     . pravastatin (PRAVACHOL) 80 MG tablet 80 mg daily.     Marland Kitchen. warfarin (COUMADIN) 2 MG tablet Take 4 mg by mouth one time only at 6 PM.     .  warfarin (COUMADIN) 3 MG tablet Take 3 mg by mouth 3 (three) times a week.    . warfarin (COUMADIN) 4 MG tablet      No current facility-administered medications for this visit.    Review of Systems Review of Systems  Constitutional: Negative.   Respiratory: Negative.   Cardiovascular: Negative.     Blood pressure 142/68, pulse 86, resp. rate 18, height 5\' 2"  (1.575 m), weight 164 lb (74.39 kg).  Physical Exam Physical Exam  Constitutional: She is oriented to person, place, and time. She appears well-developed and well-nourished.  Eyes: Conjunctivae are normal. No scleral icterus.  Neck: Neck supple. No thyromegaly present.  Cardiovascular: Normal rate, regular rhythm and normal heart sounds.   No murmur heard. Pulmonary/Chest: Effort normal and breath sounds normal. Right breast exhibits no inverted nipple, no mass, no nipple discharge, no skin change and no tenderness. Left breast exhibits no inverted nipple, no mass, no nipple discharge, no skin change and no tenderness.  Lymphadenopathy:    She has no cervical adenopathy.    She has no axillary adenopathy.  Neurological: She is alert and oriented  to person, place, and time.  Skin: Skin is warm and dry.    Data Reviewed Mammogram reviewed and stable.   Assessment    Stable exam. History of breast mass left. FH of breast cancer.    Plan    Patient will have PCP to follow her and schedule yearly mammograms.     CC: Dr. Fidel LevyJames Hawkins Jr.   SANKAR,SEEPLAPUTHUR G 10/19/2014, 6:07 PM

## 2014-11-29 DIAGNOSIS — R0902 Hypoxemia: Secondary | ICD-10-CM | POA: Diagnosis not present

## 2014-11-29 DIAGNOSIS — Z7901 Long term (current) use of anticoagulants: Secondary | ICD-10-CM | POA: Diagnosis not present

## 2014-11-29 DIAGNOSIS — J449 Chronic obstructive pulmonary disease, unspecified: Secondary | ICD-10-CM | POA: Diagnosis not present

## 2014-11-29 DIAGNOSIS — R05 Cough: Secondary | ICD-10-CM | POA: Diagnosis not present

## 2014-11-29 DIAGNOSIS — R0602 Shortness of breath: Secondary | ICD-10-CM | POA: Diagnosis not present

## 2014-12-09 DIAGNOSIS — J449 Chronic obstructive pulmonary disease, unspecified: Secondary | ICD-10-CM | POA: Diagnosis not present

## 2014-12-13 DIAGNOSIS — I2510 Atherosclerotic heart disease of native coronary artery without angina pectoris: Secondary | ICD-10-CM | POA: Diagnosis not present

## 2014-12-13 DIAGNOSIS — I10 Essential (primary) hypertension: Secondary | ICD-10-CM | POA: Diagnosis not present

## 2014-12-13 DIAGNOSIS — Z7901 Long term (current) use of anticoagulants: Secondary | ICD-10-CM | POA: Diagnosis not present

## 2014-12-13 DIAGNOSIS — N2889 Other specified disorders of kidney and ureter: Secondary | ICD-10-CM | POA: Diagnosis not present

## 2014-12-13 DIAGNOSIS — E785 Hyperlipidemia, unspecified: Secondary | ICD-10-CM | POA: Diagnosis not present

## 2014-12-14 DIAGNOSIS — Z7901 Long term (current) use of anticoagulants: Secondary | ICD-10-CM | POA: Diagnosis not present

## 2014-12-15 DIAGNOSIS — J449 Chronic obstructive pulmonary disease, unspecified: Secondary | ICD-10-CM | POA: Diagnosis not present

## 2014-12-15 DIAGNOSIS — I739 Peripheral vascular disease, unspecified: Secondary | ICD-10-CM | POA: Diagnosis not present

## 2014-12-15 DIAGNOSIS — I480 Paroxysmal atrial fibrillation: Secondary | ICD-10-CM | POA: Diagnosis not present

## 2014-12-15 DIAGNOSIS — E119 Type 2 diabetes mellitus without complications: Secondary | ICD-10-CM | POA: Diagnosis not present

## 2014-12-24 DIAGNOSIS — I517 Cardiomegaly: Secondary | ICD-10-CM | POA: Diagnosis not present

## 2014-12-24 DIAGNOSIS — R05 Cough: Secondary | ICD-10-CM | POA: Diagnosis not present

## 2014-12-24 DIAGNOSIS — J449 Chronic obstructive pulmonary disease, unspecified: Secondary | ICD-10-CM | POA: Diagnosis not present

## 2014-12-24 DIAGNOSIS — R0602 Shortness of breath: Secondary | ICD-10-CM | POA: Diagnosis not present

## 2014-12-24 DIAGNOSIS — I700 Atherosclerosis of aorta: Secondary | ICD-10-CM | POA: Diagnosis not present

## 2014-12-24 DIAGNOSIS — R0902 Hypoxemia: Secondary | ICD-10-CM | POA: Diagnosis not present

## 2014-12-29 DIAGNOSIS — N2889 Other specified disorders of kidney and ureter: Secondary | ICD-10-CM | POA: Diagnosis not present

## 2015-01-09 DIAGNOSIS — J449 Chronic obstructive pulmonary disease, unspecified: Secondary | ICD-10-CM | POA: Diagnosis not present

## 2015-01-14 DIAGNOSIS — J449 Chronic obstructive pulmonary disease, unspecified: Secondary | ICD-10-CM | POA: Diagnosis not present

## 2015-01-14 DIAGNOSIS — R05 Cough: Secondary | ICD-10-CM | POA: Diagnosis not present

## 2015-01-14 DIAGNOSIS — R0902 Hypoxemia: Secondary | ICD-10-CM | POA: Diagnosis not present

## 2015-01-28 DIAGNOSIS — I739 Peripheral vascular disease, unspecified: Secondary | ICD-10-CM | POA: Diagnosis not present

## 2015-01-28 DIAGNOSIS — J449 Chronic obstructive pulmonary disease, unspecified: Secondary | ICD-10-CM | POA: Diagnosis not present

## 2015-01-28 DIAGNOSIS — I10 Essential (primary) hypertension: Secondary | ICD-10-CM | POA: Diagnosis not present

## 2015-01-28 DIAGNOSIS — I701 Atherosclerosis of renal artery: Secondary | ICD-10-CM | POA: Diagnosis not present

## 2015-01-28 DIAGNOSIS — I70213 Atherosclerosis of native arteries of extremities with intermittent claudication, bilateral legs: Secondary | ICD-10-CM | POA: Diagnosis not present

## 2015-02-07 DIAGNOSIS — J449 Chronic obstructive pulmonary disease, unspecified: Secondary | ICD-10-CM | POA: Diagnosis not present

## 2015-02-14 DIAGNOSIS — Z7901 Long term (current) use of anticoagulants: Secondary | ICD-10-CM | POA: Diagnosis not present

## 2015-02-15 DIAGNOSIS — H40003 Preglaucoma, unspecified, bilateral: Secondary | ICD-10-CM | POA: Diagnosis not present

## 2015-02-15 DIAGNOSIS — H2513 Age-related nuclear cataract, bilateral: Secondary | ICD-10-CM | POA: Diagnosis not present

## 2015-02-21 DIAGNOSIS — Z7901 Long term (current) use of anticoagulants: Secondary | ICD-10-CM | POA: Diagnosis not present

## 2015-03-04 NOTE — Discharge Summary (Signed)
PATIENT NAME:  [name], [name] MR#:  [id number] DATE OF BIRTH:  December 16, 1945  DATE OF ADMISSION:  12/16/2012 DATE OF DISCHARGE:  12/19/2012  DISCHARGE DIAGNOSES:  1. Acute chronic obstructive pulmonary disease exacerbation.  2. Acute on chronic diastolic congestive heart failure.  3. Uncontrolled diabetes.  4. Atrial fibrillation with rapid ventricular rate.  5. Hyponatremia.  6. Leukocytosis.   CONSULTS: Dr. Juliann Paresallwood of cardiology.   IMAGING STUDIES DONE: Include chest x-ray, PA and lateral, which showed COPD with superimposed acute bronchitis and mild edema.   ADMITTING HISTORY AND PHYSICAL AND HOSPITAL COURSE: Please see detailed Woods and P dictated by Dr. Mordecai MaesSanchez on 12/16/2012. In brief, a 69 year old female patient with history of severe COPD, on 3 liters oxygen at home, along with atrial fibrillation, on Coumadin, who presented to the hospital with worsening shortness of breath. The patient was found to be in atrial fibrillation with rapid ventricular rate with COPD exacerbation, started on a Cardizem drip and admitted to the hospitalist service.   HOSPITAL COURSE:  1. Atrial fibrillation with rapid ventricular rate. The patient was on Cardizem drip on day 1, which was later stopped after her heart rate was well controlled on oral beta blocker and Cardizem, which was started new. The patient was continued on her Coumadin. The patient does not have any further palpitations. She is in atrial fibrillation, but heart rate is much well controlled between 70 to 90 and is being discharged home in a fair condition.  2. Acute chronic obstructive pulmonary disease exacerbation. The patient was started on IV steroids, nebulizers and antibiotics, with which she improved well. She is back to her baseline of 2 to 3 liters oxygen, with no wheezing on exam, is ambulating well, without any shortness of breath and is being discharged home in a fair condition. The patient will be on a prednisone taper along with  antibiotics and nebulizers around the clock.  3. 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 This should improve as her prednisone is tapered off.   On the day of discharge, the patient's temperature is 98.6, pulse of 88, blood pressure 133/74, saturating 94% on 3 liters oxygen and is being discharged home.   DISCHARGE MEDICATIONS: Include: 1. Cardizem 120 mg oral long-acting once a day.  2. Metoprolol tartrate 100 mg oral 2 times a day.  3. Pravastatin 80 mg oral once a day.  4. Lisinopril 40 mg oral once a day.  5. Daliresp 500 mcg oral once a day.  6. Symbicort 160/4.5 two puffs inhaled 2 times a day.  7. Spiriva 18 mcg inhaled once a day. 8. Albuterol nebulizer every 4 hours as needed for shortness of breath.  9. Combivent 1 puff inhaled 4 times a day.  10. Coumadin 2 mg oral on Sunday, Thursday, Friday, Saturday and 3 mg on Monday and 4 mg on Tuesday and Wednesday.  11. Dextromethorphan and guaifenesin 5 mL oral every 6 hours as needed for cough.  12. Potassium chloride 20 mEq oral once a day.  13. Prednisone 60 mg tapered over 6 days.  14. Lasix 20 mg oral once a day.   DISCHARGE INSTRUCTIONS: The patient will be on a low-sodium diet, daily fluids less than 2 liters. Follow up with Dr. Juliann Paresallwood and primary care physician in 1 to 2 weeks. Continue oxygen 2 liters continuous.   TIME SPENT: Time spent on day of discharge in discharge activity was 45 minutes.  ____________________________ Molinda BailiffSrikar R. Kevion Fatheree, MD srs:OSi D: 12/19/2012 14:58:10 ET T: 12/20/2012  05:55:13 ET JOB#: [id number]  cc: Anjel Perfetti R. Qamar Aughenbaugh, MD, <Dictator> Dwayne D. Juliann Pares, MD Teena Irani. Terance Hart, MD Orie Fisherman MD ELECTRONICALLY SIGNED 12/26/2012 1:02

## 2015-03-04 NOTE — H&P (Signed)
PATIENT NAME:  [name], [name] MR#:  [id number] DATE OF BIRTH:  September 05, 1946  DATE OF ADMISSION:  12/16/2012  PRIMARY CARE PHYSICIAN: Dr. Cay Schillings  REFERRING PHYSICIAN:   Dr. Lowella Fairy   CHIEF COMPLAINT: Increased shortness of breath.   HISTORY OF PRESENT ILLNESS: The patient is a very nice 69 year old female who has history of severe COPD diagnosed in 2008. The patient has been on oxygen nasal cannula at 2 liters at home for about 2 to 3 years. She comes today with a history of increased shortness of breath since last Friday. The patient apparently was treated with Levaquin about 3 or 4 weeks ago, mid January, and had some improvement but no significant resolution. The patient has had shortness of breath, chills and being really out of breath for the past 3 days. She states that she is not able to walk more than 2 or 3 feet without having to stop and catch some air. The patient has had some aching chills and joint pains and rib cage pain for the past week, although she has not had any fever. The patient tells she was having chills but was never feeling any hot, her temperature was always within normal limits. The patient had fevers in mid January whenever she was treated for her COPD exacerbation. The patient states that her cough has been getting worse in the past couple of days with sputum. She always has a cough, and she always has sputum, but now the intensity of the cough is worse and the sputum quantity is larger with change from color clear to yellowish and now is starting to turn a little bit green. The patient was concerned about all this, for which she came to the ER. In the ER, she was given some nebulizer treatments and, unfortunately, because of the nebs she went into atrial fibrillation with RVR with a heart rate that went up to the 140s. Next, the patient was given 2 boluses of Cardizem with 50 mg, and now her heart rate has improved down to 100-110, although she still has significant  tachycardia in the 120s whenever she sits up or moves a little bit more than what he has been. I was asked to admit the patient for control of these symptoms and medical management.   REVIEW OF SYSTEMS:  CONSTITUTIONAL: The patient denies any fever. Positive chronic fatigue. Positive chronic weakness. No significant weight loss or weight gain.  EYES: Denies any blurry vision or changes in her eyesight. No inflammation of her eyes.  ENT: No tinnitus. No significant hearing loss. No postnasal drip. No sinus pain. No difficulty swallowing.  RESPIRATORY: Positive cough, chronic, worsened within the past couple of days, positive wheezing. Negative hemoptysis. Positive dyspnea, especially with exertion. Negative painful respirations. Positive COPD.  CARDIOVASCULAR: No chest pain, no orthopnea, no significant edemas. No arrhythmia. No palpitations, no syncopal episodes.  GASTROINTESTINAL: No nausea, vomiting or diarrhea. No abdominal pain, hematemesis, rectal bleeding or constipation.  GENITOURINARY: No dysuria, hematuria or changes in urinary frequency.  GYNECOLOGIC: No breast masses. No significant history of vaginal bleeding.  ENDOCRINE: No polyuria, polydipsia, or polyphagia. No cold or heat intolerance.  HEMATOLOGIC/LYMPHATIC: No anemia, easy bruising or swollen glands.  MUSCULOSKELETAL: No significant joint deformity or gout. Positive pain on joints and muscles for the past week but overall has not happened today.  NEUROLOGIC: No numbness. No weakness. No TIAs. No CVAs. No ataxia. No seizures.  PSYCHIATRIC: No insomnia or depression.   PAST MEDICAL HISTORY: 1.  Atrial fibrillation diagnosed in 2009.  2. COPD.  3. Hypertension.  4. Osteoarthritis.  5. Osteoporosis.  6. Coronary artery disease, status post stent placement.  7. Renal artery stenosis of the left renal artery, status post renal stent.   PAST SURGICAL HISTORY:  1. Hip replacement.  2. Stent placements in the kidney and the heart.    ALLERGIES: PENICILLIN.   SOCIAL HISTORY: The patient is a former smoker. She quit in 2008. She had a history of 2 to 3 packs per day for over 40 years. She denies any current drinking. She is divorced, lives by herself. She is retired.   FAMILY HISTORY: Positive for significant history of coronary artery disease. Her dad had MI at the age of 11. Her mom had coronary artery disease, a CABG and a CVA. She had a brother who also had a CVA in his 25s. Positive history of breast cancer in her sister. Positive history of dementia, type Alzheimer's, in her mom.  CURRENT MEDICATIONS: Lisinopril 40 mg once daily, clonidine 0.2 mg 3 times a day, warfarin 2 mg on Sundays, Thursdays, Fridays, Saturdays, 2 mg on Monday and 4 mg on Tuesday and Wednesday, Pravastatin 80 mg once a day, metoprolol 100 mg twice daily, Albuterol in solution every 4 hours as needed for shortness of breath, Combivent 1 puff 4 times daily, Spiriva 18 mg once a day, Symbicort 160/4.5 mcg 2 puffs twice daily, amlodipine 5 mg once daily, Daliresp 500 mcg once a day, codeine with guaifenesin syrup p.r.n. cough,   PHYSICAL EXAMINATION: VITAL SIGNS: Blood pressure 146/79, pulse right now around 110, respirations about 24, temperature 98.4, oxygen saturation down to 94% on 2 liters nasal cannula. The patient is alert and oriented x 3, in no acute distress, mild respiratory distress with use of accessory muscles, especially when she talks or moves. No oral lesions. No oropharyngeal exudates. Anicteric sclerae. Pink conjunctivae. No thrush.  NECK: Supple. No JVD. No thyromegaly. No adenopathy. No carotid bruits. No masses. No rigidity.  CARDIOVASCULAR: Irregularly irregular, tachycardic. No murmurs, rubs, or gallops. No displacement of PMI. No tenderness to palpation of anterior chest wall.  LUNGS: The patient has some wheezing diffuse on both lung fields.  There are no signs of consolidation. No tubular sounds. There are no rales, but there might  be some crackles on both bases. Positive use of accessory muscles. No dullness to percussion.  ABDOMEN: Soft, nontender, nondistended. No hepatosplenomegaly. No masses. Bowel sounds are positive.  GENITAL: Deferred.  EXTREMITIES: No significant edema.  No clubbing or cyanosis. Pulses +2. Capillary refill is around 3 seconds.  SKIN: Without any significant rashes or petechiae. Turgor seems to be normal.  NEUROLOGIC: Cranial nerves II through XII are intact. No focal findings. Strength seems to be 5 out of 5 in 4 extremities.  PSYCHIATRIC: Mood is normal without any signs of anxiety or depression. The patient is very corporative, alert and oriented x3.  LYMPHATIC: Negative for lymphadenopathy in the neck or supraclavicular areas.  MUSCULOSKELETAL: No significant or evident joint deformity or joint effusions.   LABORATORY, DIAGNOSTIC AND RADIOLOGICAL DATA:  Her BNP is 2800.  Her glucose is 104, BUN 13, creatinine 0.76, serum sodium is 134, potassium is 3.9. Her total albumin is 3.0. All other LFTs are within normal limits. Cardiac enzymes are negative with a troponin less than 0.02.  Her white blood count is 12,000, her hemoglobin is 11.9, platelets are 184. Her INR is 2.1.  Chest x-ray report read suggested COPD with  superimposed acute bronchitis and/or developing interstitial pneumonia. There might be some elements of low-grade CHF.  EKG: Atrial fibrillation with RVR. No ST depression or elevation.   ASSESSMENT AND PLAN: This is a 69 year old female with history of COPD, atrial fibrillation, coronary artery disease, hypertension, osteoporosis, osteoarthritis. The patient comes with history of a COPD flare-up in the ER.  After nebs, she developed atrial fibrillation with RVR which is a little bit better controlled but is still not resolved.   1. Chronic respiratory failure: The patient is on 2 liters of oxygen at home. She is not requiring any higher oxygen demand, although she seems very short of  breath, and she is using accessory muscles. The patient is hemodynamically stable at this moment.  2. Increased shortness of breath:  Could be related to COPD flare-up but also might be a component of CHF. The patient does not have a history of CHF.  I am going to order an echocardiogram to evaluate this possibility as the patient has severe coronary artery disease. She has not had an echo in a long time. Her cardiologist is Dr. Juliann Paresallwood.  I am going to give her 1 dose of Lasix 40 mg and monitor her BMP in the morning and potassium. At this moment, she does not look significantly fluid overloaded, just pulmonary congested.  3. Atrial fibrillation with rapid ventricular response: The patient likely had her RVR related to the nebs, Albuterol, but also the acute process could be contributing to this. The patient may have beginnings of pneumonia versus acute bronchitis. The patient is going to be started on Levaquin IV.  The patient took Levaquin p.o. about 3 weeks ago, and she is allergic to penicillin, for which we choose Levaquin; and if the patient does not improve or gets worse, we will consider to change the antibiotics to other non-beta lactamics.   4. Hypertension: The patient has a stable blood pressure.  Continue lisinopril and metoprolol.  5. Coronary artery disease: Continue metoprolol.  The patient is not on aspirin as the patient is on warfarin. Anticoagulation with Coumadin.  At this moment her INR is therapeutic.   Other medical problems are stable. I will send a copy of this H and P to Dr. Sheppard PentonWolf, also Dr. Juliann Paresallwood.   CODE STATUS: The patient is a FULL CODE.    TIME SPENT:  I spent about 45 minutes with this admission.   ____________________________ Felipa Furnaceoberto Sanchez Gutierrez, MD rsg:cb D: 12/16/2012 15:44:00 ET T: 12/16/2012 17:11:32 ET JOB#: [id number]  cc: Mickie HillierJack H. Sheppard PentonWolf, MD Dwayne D. Juliann Paresallwood, MD Felipa Furnaceoberto Sanchez Gutierrez, MD, <Dictator>  Shakemia Madera Juanda ChanceSANCHEZ GUTIERRE MD ELECTRONICALLY  SIGNED 12/23/2012 13:47

## 2015-03-04 NOTE — H&P (Signed)
PATIENT NAME:  Savannah FaithDAMS, [name] MR#:  [id number] DATE OF BIRTH:  1946/07/31  DATE OF ADMISSION:  12/16/2012  Addendum  The patient continued to have labile heart rate going into the 120s to 130s for which she is being started on a Cardizem drip by the ER physician. The patient is going to be transferred to the CCU and monitored closely overnight. Hopefully, we can get her off the drip soon. At this moment, she is in stable with a good blood pressure. The patient changed to a full admission.  TIME SPENT WITH THIS PATIENT:  50 minutes.    ____________________________ Felipa Furnaceoberto Sanchez Gutierrez, MD rsg:si D: 12/16/2012 15:47:08 ET T: 12/16/2012 16:07:03 ET JOB#: [id number]  cc: Felipa Furnaceoberto Sanchez Gutierrez, MD, <Dictator> Byrne Capek Juanda ChanceSANCHEZ GUTIERRE MD ELECTRONICALLY SIGNED 12/23/2012 13:46

## 2015-03-05 NOTE — H&P (Signed)
PATIENT NAME:  [name], [name] MR#:  [id number] DATE OF BIRTH:  Jun 09, 1946  DATE OF ADMISSION:  02/20/2014  PRIMARY CARE PHYSICIAN: Dr. Lahoma RockerPancaldo    PULMONOLOGIST:  Dr. Meredeth IdeFleming  CHIEF COMPLAINT: Shortness of breath.   HISTORY OF PRESENT ILLNESS: This is a 69 year old female with history of COPD, on oxygen at home. She does not wear it 24/7, but recently has had to wear it 24/7. She had a rough week with a head cold and she cannot breathe. Today she started coughing up some reddish phlegm. She also had a cold, clammy, sweat. In the ER, she was found to be in respiratory distress requiring BiPAP initially 100%. Since we did not have any ICU beds available, the patient was held in the Emergency Room for a good period of time until the patient had improved. The patient was tapered down on her oxygen requirements, and was brought down to 3 liters. She still was using accessory muscles to breathe. Hospitalist services were contacted for further evaluation.   PAST MEDICAL HISTORY: Atrial fibrillation, COPD, end-stage on oxygen, diabetes, hypertension, osteoarthritis, osteoporosis, coronary artery disease, renal artery stenosis, status post stent of the left renal artery.   PAST SURGICAL HISTORY: Hip replacement, stent in the kidney, cardiac stent, ear surgery.   ALLERGIES: PENICILLIN, which causes a rash.   FAMILY HISTORY: Mother died of heart disease and had a stroke. Father died of heart disease.   SOCIAL HISTORY: Former smoker, quit in 2008. No smoking any more. No alcohol. No drug use. Lives alone. Used to work in [employer] work, and was a Naval architecttruck driver in the past.   MEDICATIONS: As per Restaurant manager, fast foodprescription writer include: albuterol nebulizer 3 mL every 4 hours as needed for shortness of breath, Combivent CFC 1 puff 4 times a day, Cardia XT 180 mg extended release daily, furosemide 20 mg daily, lisinopril 40 mg daily, metoprolol tartrate 100 mg twice a day, potassium chloride 20 mEq twice a day, pravastatin 80 mg  at bedtime, Spiriva 1 inhalation daily, Symbicort 160/4.5, 2 puffs twice a day, warfarin 3 mg on Sunday, Tuesday, Wednesday, Friday and Saturday, 4 mg on Monday and Thursday.   REVIEW OF SYSTEMS:   CONSTITUTIONAL: Positive for cold sweats. No fever. Positive for fatigue.  EYES: She does wear reading glasses.  EARS, NOSE, MOUTH AND THROAT:  Decreased hearing. Positive for runny nose, postnasal drip and sinus pressure. Positive for sore throat. No difficulty swallowing.  CARDIOVASCULAR: No chest pain.  RESPIRATORY: Positive for shortness of breath. Positive for cough. Positive for blood-tinged sputum.  GASTROINTESTINAL: No nausea. No vomiting. No abdominal pain. No diarrhea. No constipation. No bright red blood per rectum. No melena.  GENITOURINARY: No burning on urination. No hematuria.  MUSCULOSKELETAL: No joint pain or muscle pain.  INTEGUMENT: No rashes or eruptions.  NEUROLOGIC: No fainting or blackouts.  PSYCHIATRIC: No anxiety or depression.  ENDOCRINE: No thyroid problems.  HEMATOLOGIC AND LYMPHATIC: No anemia.   PHYSICAL EXAMINATION: VITAL SIGNS: On presentation to the Emergency Room included a temperature of 98.5, pulse 106, respirations 30, blood pressure 202/102, pulse ox 97% and that was on 100% nonrebreather.  GENERAL: Positive for respiratory distress, using accessory muscles to breathe.  EYES: Conjunctivae and lids normal. Pupils equal, round and reactive to light. Extraocular muscles intact. No nystagmus.  EARS, NOSE, MOUTH AND THROAT: Tympanic membranes: No erythema. Nasal mucosa: No erythema.  THROAT: No erythema, no exudate seen. Lips and gums: No lesions.  NECK: No JVD. No bruits. No lymphadenopathy no  thyromegaly. No thyroid nodules palpated.  RESPIRATORY: Positive use of accessory muscles to breathe. Decreased breath sounds bilaterally. Positive expiratory wheeze.  CARDIOVASCULAR: S1, S2, tachycardic. No gallops, rubs, or murmurs heard. Carotid upstroke 2+ bilaterally.  No bruits. Dorsalis pedis pulses 1+ bilaterally. Trace edema of the lower extremity.  ABDOMEN: Soft, nontender. No organomegaly/splenomegaly. Normoactive bowel sounds.  EXTREMITIES: Trace edema. No clubbing. No cyanosis. On oxygen.  NEUROLOGIC: Cranial nerves II through XII grossly intact. Deep tendon reflexes 1+ bilateral lower extremities.  PSYCHIATRIC: The patient is oriented to person, place, and time.  SKIN: No rashes or lesions seen.   LABORATORY AND RADIOLOGICAL DATA: INR 2.1. BNP 2214, phosphorus 2.6. Troponin negative. Magnesium 1.1. Glucose 131, BUN 13, creatinine 0.7, sodium 129, potassium 3.8, chloride 92, CO2 of 34, calcium 8.0. Liver function tests normal range. White blood cell count 13.0, Woods and Woods 12.9 and 40.5, platelet count 176. Venous pH showed a pH of 7.29, pCO2 of 72. Lactic acid 1.9. Chest x-ray:  Stable, mild interstitial change left lower lobe, likely reflecting chronic inflammatory change related to COPD. EKG: Atrial fibrillation, 109 beats per minute, flipped T waves laterally.   ASSESSMENT AND PLAN: 1.  Acute respiratory failure, initially requiring 100% BiPAP to oxygenate. The patient was watched in the Emergency Room, since there were no ICU beds, to make sure she was moving in the right direction. With the help of Respiratory, we were able to get her to nasal cannula 3 liters. I will keep the BiPAP at the bedside at this point.   2.  Chronic obstructive pulmonary disease exacerbation. Will give IV Zithromax daily, Solu-Medrol 125 mg IV stat, and 60 mg q. 6 hours. Continue Spiriva, Symbicort, and nebulizers.   3.  Hypomagnesemia, severe. Will give 4 grams IV magnesium stat.   4.  Atrial fibrillation. Rate is borderline. Continue usual medications. Likely the rate is up secondary to the acute respiratory failure. Coumadin is therapeutic. Will increase Coumadin to 4 mg for today.   5.  Systemic inflammatory response syndrome, with elevated white count, tachycardia  increased respiratory rate. Continue to monitor. Chest x-ray is negative. Will continue Zithromax. Will send off a urinalysis.   6.  Hyponatremia.  Will put on fluid restriction and continue Lasix at this point.   TIME SPENT ON ADMISSION: 55 minutes.   The patient was critically ill upon presentation.      ____________________________ Herschell Dimes. Renae Gloss, MD rjw:mr D: 02/20/2014 21:08:00 ET T: 02/20/2014 22:43:13 ET JOB#: [id number]  cc: Nira Conn, MD Herschell Dimes. Renae Gloss, MD, <Dictator> DR. FLEMING, PULMONARY  Salley Scarlet MD ELECTRONICALLY SIGNED 02/21/2014 19:31

## 2015-03-05 NOTE — Discharge Summary (Signed)
PATIENT NAME:  [name], [name] MR#:  [id number] DATE OF BIRTH:  [birth year]  DATE OF ADMISSION:  02/20/2014 DATE OF DISCHARGE:  02/23/2014  For a detailed note, please see the history and physical done on admission by Dr. Renae GlossWieting.   DIAGNOSES AT DISCHARGE: Is as follows:  1. Fetal chronic respiratory failure due to chronic obstructive pulmonary disease exacerbation. Chronic obstructive pulmonary disease exacerbation.  2. Hypertension.  3. History of chronic atrial fibrillation.  4. Hyperlipidemia.   DISCHARGE DIET: The patient is being discharged on a low-sodium, low-fat diet.   ACTIVITY: As tolerated.   FOLLOWUP:  Dr. Nira ConnAriana Pancaldo, MD in the next 1-2 weeks.   DISCHARGE MEDICATIONS: Metoprolol tartrate 100 mg b.i.d.; Pravachol 80 mg at bedtime; lisinopril 40 mg daily; Symbicort 160/4.5 two puffs b.i.d.; DuoNebs q.i.d. as needed; warfarin 4 mg at bedtime on Mondays and Thursdays; warfarin 3 mg on Sunday, Tuesday, Wednesday, Friday and Saturday; Spiriva 1 puff daily; potassium 20 mEq b.i.d.; Lasix 20 mg daily; Cardizem CD 180 mg daily; prednisone taper starting at 50 mg down to 10 mg over the next 10 days; azithromycin 5 mg daily x 3 days.   PERTINENT STUDIES DONE DURING THE HOSPITAL COURSE: Are as follows: A chest x-ray done on admission showing mild interstitial change left lower lobe, likely reflecting chronic inflammatory change related to COPD.   HOSPITAL COURSE: This is a 69 year old female who presented to the hospital with shortness of breath, wheezing, and cough, and noted to be in respiratory failure secondary to COPD exacerbation.  1. Acute on chronic respiratory failure. This was likely secondary to COPD exacerbation and no evidence of underlying CHF. The patient was treated aggressively with IV steroids, around-the-clock nebulizer treatments, maintained on Advair, Spiriva, and started on empiric Zithromax. After being on aggressive therapy, patient's clinical symptoms have improved.  She presently is being discharged on oral prednisone taper along with a few more days of Zithromax and maintenance of her inhalers and nebulizer treatments. She is already on oxygen at home which she will continue.  2. Chronic obstructive pulmonary disease exacerbation. This was likely secondary to acute bronchitis. Initially, the patient was on BiPAP on admission, quickly taken off of it within the first 24 hours. Although she continued to have significant wheezing and bronchospasm, was treated aggressively with IV steroids, around-the-clock nebulizer treatments, maintained on her Symbicort, Spiriva, and empiric Zithromax. The patient has significantly improved since admission over the past 2-3 days; therefore, currently being discharged on an oral prednisone taper along with oral Zithromax and the maintenance inhalers as stated.  3. Hypertension. The patient remained hemodynamically stable. She will continue her lisinopril and metoprolol.  4. Hyponatremia. 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 She was given some fluids. Her sodium was as low as 125, but was up to as high as 131 on the day prior to discharge.  5. Chronic atrial fibrillation. The patient remained rate controlled in the hospital on her Cardizem, which she will continue. She will also continue her Coumadin and her INR was therapeutic.   DISPOSITION: The patient is being discharged home.   TIME SPENT ON DISCHARGE: 40 minutes.    ____________________________ Rolly PancakeVivek J. Cherlynn KaiserSainani, MD vjs:dd D: 02/23/2014 16:43:59 ET T: 02/23/2014 18:26:10 ET JOB#: [id number]  cc: Rolly PancakeVivek J. Cherlynn KaiserSainani, MD, <Dictator> Nira ConnAriana Pancaldo, MD Houston SirenVIVEK J SAINANI MD ELECTRONICALLY SIGNED 03/08/2014 10:39

## 2015-03-07 DIAGNOSIS — Z7901 Long term (current) use of anticoagulants: Secondary | ICD-10-CM | POA: Diagnosis not present

## 2015-03-10 DIAGNOSIS — J449 Chronic obstructive pulmonary disease, unspecified: Secondary | ICD-10-CM | POA: Diagnosis not present

## 2015-03-16 DIAGNOSIS — R05 Cough: Secondary | ICD-10-CM | POA: Diagnosis not present

## 2015-03-16 DIAGNOSIS — J432 Centrilobular emphysema: Secondary | ICD-10-CM | POA: Diagnosis not present

## 2015-03-16 DIAGNOSIS — Z7901 Long term (current) use of anticoagulants: Secondary | ICD-10-CM | POA: Diagnosis not present

## 2015-03-16 DIAGNOSIS — R0609 Other forms of dyspnea: Secondary | ICD-10-CM | POA: Diagnosis not present

## 2015-03-16 DIAGNOSIS — R0902 Hypoxemia: Secondary | ICD-10-CM | POA: Diagnosis not present

## 2015-03-16 LAB — PROTIME-INR: PROTIME: 20.5 s — AB (ref 10.0–13.8)

## 2015-03-16 LAB — POCT INR: INR: 2 — AB (ref <=1.1)

## 2015-04-03 DIAGNOSIS — M7030 Other bursitis of elbow, unspecified elbow: Secondary | ICD-10-CM | POA: Insufficient documentation

## 2015-04-03 DIAGNOSIS — E785 Hyperlipidemia, unspecified: Secondary | ICD-10-CM | POA: Insufficient documentation

## 2015-04-03 DIAGNOSIS — I2510 Atherosclerotic heart disease of native coronary artery without angina pectoris: Secondary | ICD-10-CM | POA: Insufficient documentation

## 2015-04-03 DIAGNOSIS — I4891 Unspecified atrial fibrillation: Secondary | ICD-10-CM | POA: Insufficient documentation

## 2015-04-03 DIAGNOSIS — I10 Essential (primary) hypertension: Secondary | ICD-10-CM | POA: Insufficient documentation

## 2015-04-03 DIAGNOSIS — Z9229 Personal history of other drug therapy: Secondary | ICD-10-CM | POA: Insufficient documentation

## 2015-04-03 DIAGNOSIS — N2889 Other specified disorders of kidney and ureter: Secondary | ICD-10-CM | POA: Insufficient documentation

## 2015-04-03 DIAGNOSIS — H9190 Unspecified hearing loss, unspecified ear: Secondary | ICD-10-CM | POA: Insufficient documentation

## 2015-04-03 DIAGNOSIS — Z1331 Encounter for screening for depression: Secondary | ICD-10-CM | POA: Insufficient documentation

## 2015-04-03 DIAGNOSIS — Z9189 Other specified personal risk factors, not elsewhere classified: Secondary | ICD-10-CM | POA: Insufficient documentation

## 2015-04-03 DIAGNOSIS — L989 Disorder of the skin and subcutaneous tissue, unspecified: Secondary | ICD-10-CM | POA: Insufficient documentation

## 2015-04-03 DIAGNOSIS — E119 Type 2 diabetes mellitus without complications: Secondary | ICD-10-CM | POA: Insufficient documentation

## 2015-04-03 DIAGNOSIS — Z8669 Personal history of other diseases of the nervous system and sense organs: Secondary | ICD-10-CM | POA: Insufficient documentation

## 2015-04-03 DIAGNOSIS — J309 Allergic rhinitis, unspecified: Secondary | ICD-10-CM | POA: Insufficient documentation

## 2015-04-03 DIAGNOSIS — H7290 Unspecified perforation of tympanic membrane, unspecified ear: Secondary | ICD-10-CM | POA: Insufficient documentation

## 2015-04-09 DIAGNOSIS — J449 Chronic obstructive pulmonary disease, unspecified: Secondary | ICD-10-CM | POA: Diagnosis not present

## 2015-04-15 ENCOUNTER — Ambulatory Visit (INDEPENDENT_AMBULATORY_CARE_PROVIDER_SITE_OTHER): Payer: Medicare Other | Admitting: Family Medicine

## 2015-04-15 ENCOUNTER — Encounter: Payer: Self-pay | Admitting: Family Medicine

## 2015-04-15 VITALS — BP 123/74 | HR 80 | Temp 98.0°F | Resp 14 | Ht 62.0 in | Wt 158.4 lb

## 2015-04-15 DIAGNOSIS — E119 Type 2 diabetes mellitus without complications: Secondary | ICD-10-CM | POA: Diagnosis not present

## 2015-04-15 DIAGNOSIS — I10 Essential (primary) hypertension: Secondary | ICD-10-CM

## 2015-04-15 DIAGNOSIS — H938X1 Other specified disorders of right ear: Secondary | ICD-10-CM

## 2015-04-15 DIAGNOSIS — H938X9 Other specified disorders of ear, unspecified ear: Secondary | ICD-10-CM | POA: Insufficient documentation

## 2015-04-15 DIAGNOSIS — Z7901 Long term (current) use of anticoagulants: Secondary | ICD-10-CM | POA: Diagnosis not present

## 2015-04-15 DIAGNOSIS — I482 Chronic atrial fibrillation, unspecified: Secondary | ICD-10-CM

## 2015-04-15 DIAGNOSIS — J449 Chronic obstructive pulmonary disease, unspecified: Secondary | ICD-10-CM

## 2015-04-15 DIAGNOSIS — J431 Panlobular emphysema: Secondary | ICD-10-CM | POA: Diagnosis not present

## 2015-04-15 DIAGNOSIS — E785 Hyperlipidemia, unspecified: Secondary | ICD-10-CM

## 2015-04-15 LAB — CHECK PULSE OXIMETRY WHILE AMBULATING: %SAT: 80

## 2015-04-15 MED ORDER — METOPROLOL TARTRATE 100 MG PO TABS: 100.0000 mg | ORAL_TABLET | Freq: Two times a day (BID) | ORAL | Status: DC

## 2015-04-15 NOTE — Assessment & Plan Note (Signed)
Continue medications and diet.  Await A1c results.

## 2015-04-15 NOTE — Progress Notes (Signed)
Name: [name]   MRN: [id number]    DOB: [birth year]   Date:04/15/2015       Progress Note  Subjective  Chief Complaint  Chief Complaint  Patient presents with  . Hypertension  . Diabetes  . COPD  . Atrial Fibrillation    HPI -  For f/u of HBP, DM, COPD, Hyperlipidemia and anticoagulation for chronic a. Fib.  O2 level usually in 80-90 range. Overall feels ok.  Has knot on R. Ear x 1 month.  Getting larger.  Essential (primary) hypertension Continue medications   Diabetes mellitus, type 2 Continue medications and diet.  Await A1c results.   A-fib Continue meds and await INR results.   Chronic airway obstruction Try to keep O2 above 87.  Use O2 at 3L/min. More often (instead of 2L/min).  Check with Dr. Reita Cliche office about low O2s     Past Medical History  Diagnosis Date  . COPD (chronic obstructive pulmonary disease) Jan 2014  . A-fib   . Coronary artery disease     Past Surgical History  Procedure Laterality Date  . Spine surgery  1999  . Joint replacement  1994, 1996, 2000    hip  . Coronary stent placement  2007    Family History  Problem Relation Age of Onset  . Heart failure Father   . Stroke Mother   . Cancer Sister 36    breast    History   Social History  . Marital Status: Divorced    Spouse Name: N/A  . Number of Children: N/A  . Years of Education: N/A   Occupational History  . Not on file.   Social History Main Topics  . Smoking status: Former Smoker -- 2.00 packs/day for 40 years    Types: Cigarettes  . Smokeless tobacco: Never Used  . Alcohol Use: No  . Drug Use: No  . Sexual Activity: Not on file   Other Topics Concern  . Not on file   Social History Narrative     Current outpatient prescriptions:  .  ACCU-CHEK AVIVA PLUS test strip, , Disp: , Rfl:  .  albuterol (PROVENTIL) (2.5 MG/3ML) 0.083% nebulizer solution, Take 2.5 mg by nebulization every 6 (six) hours as needed for wheezing., Disp: , Rfl:  .   budesonide-formoterol (SYMBICORT) 160-4.5 MCG/ACT inhaler, Inhale into the lungs., Disp: , Rfl:  .  CARTIA XT 120 MG 24 hr capsule, 120 mg daily. , Disp: , Rfl:  .  cetirizine (ZYRTEC) 10 MG tablet, Take by mouth., Disp: , Rfl:  .  furosemide (LASIX) 20 MG tablet, daily. , Disp: , Rfl:  .  IPRATROPIUM-ALBUTEROL IN, Inhale into the lungs., Disp: , Rfl:  .  lisinopril (PRINIVIL,ZESTRIL) 40 MG tablet, daily. , Disp: , Rfl:  .  metFORMIN (GLUCOPHAGE) 500 MG tablet, , Disp: , Rfl:  .  metoprolol (LOPRESSOR) 100 MG tablet, Take 1 tablet (100 mg total) by mouth 2 (two) times daily., Disp: 180 tablet, Rfl: 3 .  OXYGEN-HELIUM IN, Inhale into the lungs. 2 liters of oxygen., Disp: , Rfl:  .  potassium chloride SA (K-DUR,KLOR-CON) 20 MEQ tablet, 20 mEq 2 (two) times daily. , Disp: , Rfl:  .  pravastatin (PRAVACHOL) 80 MG tablet, 80 mg daily. , Disp: , Rfl:  .  predniSONE (DELTASONE) 5 MG tablet, , Disp: , Rfl:  .  theophylline (THEODUR) 100 MG 12 hr tablet, TAKE 1 TABLET BY MOUTH TWICE DAILY, Disp: , Rfl:  .  tiotropium (SPIRIVA) 18  MCG inhalation capsule, Place into inhaler and inhale., Disp: , Rfl:  .  warfarin (COUMADIN) 3 MG tablet, Take 3 mg by mouth 3 (three) times a week. 1 tab alternating with 1 1/2 tab every other day\, Disp: , Rfl:  .  warfarin (COUMADIN) 2 MG tablet, Take 4 mg by mouth one time only at 6 PM. , Disp: , Rfl:  .  warfarin (COUMADIN) 4 MG tablet, , Disp: , Rfl:   Allergies  Allergen Reactions  . Penicillins Rash     Review of Systems  Constitutional: Negative.   HENT:       R. Ear mass  Eyes: Negative.   Respiratory: Positive for cough, shortness of breath and wheezing (occ.).   Cardiovascular: Negative.   Gastrointestinal: Negative.   Musculoskeletal: Negative.   Skin: Negative.   Neurological: Negative.   Endo/Heme/Allergies: Negative.      Objective  Filed Vitals:   04/15/15 0944 04/15/15 1028  BP: 123/74   Pulse: 80   Temp: 98 F (36.7 C)   Resp: 14     Height: 5\' 2"  (1.575 m)   Weight: 158 lb 6.4 oz (71.85 kg)   SpO2: 84% 87%    Physical Exam  Constitutional: She is oriented to person, place, and time and well-developed, well-nourished, and in no distress. No distress.  HENT:  Ears:  Eyes: Conjunctivae are normal. Pupils are equal, round, and reactive to light. No scleral icterus.  Neck: Normal range of motion. Neck supple. No thyromegaly present.  Cardiovascular: An irregularly irregular rhythm present.  Atr. fib.  Pulmonary/Chest: No respiratory distress. She has decreased breath sounds (throughout). She has wheezes (faint throughout).  Abdominal: Soft. There is no tenderness.  Musculoskeletal: Normal range of motion.  Lymphadenopathy:    She has no cervical adenopathy.  Neurological: She is alert and oriented to person, place, and time.       Recent Results (from the past 2160 hour(s))  POCT INR     Status: Abnormal   Collection Time: 03/16/15 12:00 AM  Result Value Ref Range   INR 2.0 (A) .9 - 1.1  Protime-INR     Status: Abnormal   Collection Time: 03/16/15 12:00 AM  Result Value Ref Range   Protime 20.5 (A) 10.0 - 13.8 seconds     Assessment & Plan  Problem List Items Addressed This Visit      Cardiovascular and Mediastinum   A-fib    Continue meds and await INR results.      Relevant Medications   metoprolol (LOPRESSOR) 100 MG tablet   Essential (primary) hypertension - Primary    Continue medications      Relevant Medications   metoprolol (LOPRESSOR) 100 MG tablet   Other Relevant Orders   COMPLETE METABOLIC PANEL WITH GFR     Respiratory   Chronic airway obstruction    Try to keep O2 above 87.  Use O2 at 3L/min. More often (instead of 2L/min).  Check with Dr. Reita ClicheFleming's office about low O2s      Relevant Medications   predniSONE (DELTASONE) 5 MG tablet   IPRATROPIUM-ALBUTEROL IN   Other Relevant Orders   CBC with Differential     Endocrine   Diabetes mellitus, type 2    Continue  medications and diet.  Await A1c results.      Relevant Orders   HgB A1c     Nervous and Auditory   Ear mass     Other   Dyslipidemia   Relevant Orders  Lipid Profile   Encounter for current long-term use of anticoagulants   Relevant Orders   INR/PT      Meds ordered this encounter  Medications  .       .       . metoprolol (LOPRESSOR) 100 MG tablet    Sig: Take 1 tablet (100 mg total) by mouth 2 (two) times daily.    Dispense:  180 tablet    Refill:  3    1. Essential (primary) hypertension  - COMPLETE METABOLIC PANEL WITH GFR  2. Chronic atrial fibrillation   3. Panlobular emphysema  - CBC with Differential  4. Type 2 diabetes mellitus without complication  - HgB A1c  5. Dyslipidemia  - Lipid Profile  6. Encounter for current long-term use of anticoagulants  - INR/PT  7. Ear mass, right- inclusion cyst

## 2015-04-15 NOTE — Assessment & Plan Note (Signed)
Try to keep O2 above 87.  Use O2 at 3L/min. More often (instead of 2L/min).  Check with Dr. Reita ClicheFleming's office about low O2s

## 2015-04-15 NOTE — Progress Notes (Signed)
This encounter was created in error - please disregard.

## 2015-04-15 NOTE — Assessment & Plan Note (Signed)
Continue medications

## 2015-04-15 NOTE — Assessment & Plan Note (Signed)
Continue meds and await INR results.

## 2015-04-19 DIAGNOSIS — J431 Panlobular emphysema: Secondary | ICD-10-CM | POA: Diagnosis not present

## 2015-04-19 DIAGNOSIS — Z7901 Long term (current) use of anticoagulants: Secondary | ICD-10-CM | POA: Diagnosis not present

## 2015-04-19 DIAGNOSIS — E785 Hyperlipidemia, unspecified: Secondary | ICD-10-CM | POA: Diagnosis not present

## 2015-04-19 DIAGNOSIS — E119 Type 2 diabetes mellitus without complications: Secondary | ICD-10-CM | POA: Diagnosis not present

## 2015-04-19 LAB — CBC WITH DIFFERENTIAL/PLATELET
BASOS ABS: 0.1 10*3/uL (ref 0.0–0.2)
Basos: 1 %
EOS (ABSOLUTE): 0.1 10*3/uL (ref 0.0–0.4)
Eos: 1 %
HEMATOCRIT: 36.9 % (ref 34.0–46.6)
Hemoglobin: 12.7 g/dL (ref 11.1–15.9)
IMMATURE GRANS (ABS): 0 10*3/uL (ref 0.0–0.1)
Immature Granulocytes: 0 %
Lymphocytes Absolute: 1 10*3/uL (ref 0.7–3.1)
Lymphs: 8 %
MCH: 31.8 pg (ref 26.6–33.0)
MCHC: 34.4 g/dL (ref 31.5–35.7)
MCV: 92 fL (ref 79–97)
MONOCYTES: 6 %
Monocytes Absolute: 0.8 10*3/uL (ref 0.1–0.9)
NEUTROS ABS: 10.6 10*3/uL — AB (ref 1.4–7.0)
Neutrophils: 84 %
PLATELETS: 294 10*3/uL (ref 150–379)
RBC: 4 x10E6/uL (ref 3.77–5.28)
RDW: 14.7 % (ref 12.3–15.4)
WBC: 12.6 10*3/uL — AB (ref 3.4–10.8)

## 2015-04-19 LAB — PROTIME-INR
INR: 2.2 — ABNORMAL HIGH (ref 0.8–1.2)
Prothrombin Time: 22.8 s — ABNORMAL HIGH (ref 9.1–12.0)

## 2015-04-19 LAB — HEMOGLOBIN A1C
Est. average glucose Bld gHb Est-mCnc: 131 mg/dL
Hgb A1c MFr Bld: 6.2 % — ABNORMAL HIGH (ref 4.8–5.6)

## 2015-04-20 ENCOUNTER — Telehealth: Payer: Self-pay

## 2015-04-20 DIAGNOSIS — D689 Coagulation defect, unspecified: Secondary | ICD-10-CM

## 2015-04-20 LAB — LIPID PANEL
Chol/HDL Ratio: 2.4 ratio units (ref 0.0–4.4)
Cholesterol, Total: 139 mg/dL (ref 100–199)
HDL: 57 mg/dL (ref >39)
LDL Calculated: 62 mg/dL (ref 0–99)
Triglycerides: 100 mg/dL (ref 0–149)
VLDL CHOLESTEROL CAL: 20 mg/dL (ref 5–40)

## 2015-04-20 MED ORDER — WARFARIN SODIUM 3 MG PO TABS: 3.0000 mg | ORAL_TABLET | Freq: Every day | ORAL | Status: DC

## 2015-04-20 MED ORDER — WARFARIN SODIUM 3 MG PO TABS: 3.0000 mg | ORAL_TABLET | ORAL | Status: DC

## 2015-04-20 NOTE — Progress Notes (Signed)
Advised.JH  

## 2015-04-20 NOTE — Telephone Encounter (Signed)
Called in warfarin refill same as before 3 mg daily .[facility]

## 2015-04-22 LAB — CMP14+EGFR
A/G RATIO: 1.5 (ref 1.1–2.5)
ALBUMIN: 4.1 g/dL (ref 3.5–4.8)
ALT: 23 IU/L (ref 0–32)
AST: 18 IU/L (ref 0–40)
Alkaline Phosphatase: 74 IU/L (ref 39–117)
BUN/Creatinine Ratio: 23 (ref 11–26)
BUN: 18 mg/dL (ref 8–27)
Bilirubin Total: <0.2 mg/dL (ref 0.0–1.2)
CO2: 24 mmol/L (ref 18–29)
Calcium: 9.4 mg/dL (ref 8.7–10.3)
Chloride: 95 mmol/L — ABNORMAL LOW (ref 97–108)
Creatinine, Ser: 0.79 mg/dL (ref 0.57–1.00)
GFR, EST AFRICAN AMERICAN: 88 mL/min/{1.73_m2} (ref >59)
GFR, EST NON AFRICAN AMERICAN: 76 mL/min/{1.73_m2} (ref >59)
GLUCOSE: 123 mg/dL — AB (ref 65–99)
Globulin, Total: 2.8 g/dL (ref 1.5–4.5)
Potassium: 4.9 mmol/L (ref 3.5–5.2)
Sodium: 139 mmol/L (ref 134–144)
Total Protein: 6.9 g/dL (ref 6.0–8.5)

## 2015-04-22 LAB — SPECIMEN STATUS REPORT

## 2015-05-10 DIAGNOSIS — J449 Chronic obstructive pulmonary disease, unspecified: Secondary | ICD-10-CM | POA: Diagnosis not present

## 2015-06-01 ENCOUNTER — Telehealth: Payer: Self-pay | Admitting: Family Medicine

## 2015-06-01 ENCOUNTER — Other Ambulatory Visit: Payer: Self-pay | Admitting: Family Medicine

## 2015-06-01 DIAGNOSIS — D699 Hemorrhagic condition, unspecified: Secondary | ICD-10-CM

## 2015-06-01 NOTE — Telephone Encounter (Signed)
Pt called need lab order   Will pick up

## 2015-06-01 NOTE — Telephone Encounter (Signed)
Ready for front to pick her up.

## 2015-06-03 ENCOUNTER — Encounter: Payer: Self-pay | Admitting: Family Medicine

## 2015-06-07 DIAGNOSIS — D699 Hemorrhagic condition, unspecified: Secondary | ICD-10-CM | POA: Diagnosis not present

## 2015-06-07 LAB — PROTIME-INR
INR: 2.6 — ABNORMAL HIGH (ref 0.8–1.2)
PROTHROMBIN TIME: 26.9 s — AB (ref 9.1–12.0)

## 2015-06-09 ENCOUNTER — Telehealth: Payer: Self-pay | Admitting: Family Medicine

## 2015-06-09 DIAGNOSIS — J449 Chronic obstructive pulmonary disease, unspecified: Secondary | ICD-10-CM | POA: Diagnosis not present

## 2015-06-09 DIAGNOSIS — I482 Chronic atrial fibrillation, unspecified: Secondary | ICD-10-CM

## 2015-06-09 NOTE — Telephone Encounter (Signed)
Can you suggest and I will call pt result is in chart.

## 2015-06-09 NOTE — Telephone Encounter (Signed)
Pt called request her lab results. Pt call back # is  [phone].

## 2015-06-10 NOTE — Telephone Encounter (Signed)
Did you see the result note sent on this patient?  Her INR is steady between 2-3 where we want her. Continue current medication regimen and we will recheck next month.

## 2015-06-10 NOTE — Progress Notes (Signed)
Pt.notified

## 2015-06-16 DIAGNOSIS — J439 Emphysema, unspecified: Secondary | ICD-10-CM | POA: Diagnosis not present

## 2015-06-16 DIAGNOSIS — R0609 Other forms of dyspnea: Secondary | ICD-10-CM | POA: Diagnosis not present

## 2015-06-16 DIAGNOSIS — R0902 Hypoxemia: Secondary | ICD-10-CM | POA: Diagnosis not present

## 2015-06-21 DIAGNOSIS — R0902 Hypoxemia: Secondary | ICD-10-CM | POA: Diagnosis not present

## 2015-06-21 DIAGNOSIS — J449 Chronic obstructive pulmonary disease, unspecified: Secondary | ICD-10-CM | POA: Diagnosis not present

## 2015-06-21 DIAGNOSIS — I10 Essential (primary) hypertension: Secondary | ICD-10-CM | POA: Diagnosis not present

## 2015-06-21 DIAGNOSIS — I480 Paroxysmal atrial fibrillation: Secondary | ICD-10-CM | POA: Diagnosis not present

## 2015-07-08 ENCOUNTER — Encounter: Payer: Self-pay | Admitting: Family Medicine

## 2015-07-08 ENCOUNTER — Ambulatory Visit (INDEPENDENT_AMBULATORY_CARE_PROVIDER_SITE_OTHER): Payer: Medicare Other | Admitting: Family Medicine

## 2015-07-08 VITALS — BP 152/82 | HR 80 | Resp 16 | Ht 62.0 in | Wt 157.2 lb

## 2015-07-08 DIAGNOSIS — Z0000 Encounter for general adult medical examination without abnormal findings: Secondary | ICD-10-CM

## 2015-07-08 NOTE — Patient Instructions (Signed)
Health Maintenance  Topic Date Due  . INFLUENZA VACCINE  06/13/2015  . FOOT EXAM  07/19/2015 (Originally 04/06/1956)  . Hepatitis C Screening  02/09/2017 (Originally 1946-01-07)  . ZOSTAVAX  02/09/2018 (Originally 04/05/2005)  . HEMOGLOBIN A1C  10/19/2015  . OPHTHALMOLOGY EXAM  01/11/2016  . URINE MICROALBUMIN  07/07/2016  . MAMMOGRAM  10/11/2016  . COLONOSCOPY  11/12/2016  . TETANUS/TDAP  11/12/2021  . DEXA SCAN  Completed  . PNA vac Low Risk Adult  Completed

## 2015-07-08 NOTE — Progress Notes (Signed)
Patient: [name], Female    DOB: 1946-06-26, 69 y.o.   MRN: [id number] Visit Date: 07/08/2015  Today's Provider: Fidel Levy, MD   Chief Complaint  Patient presents with  . Medicare Wellness    Subsiquent     Subjective:    Annual wellness visit DOSIA [name] is a 69 y.o. female who presents today for her Subsequent Annual Wellness Visit. She feels well. She reports exercising . She reports she is sleeping well.   ----------------------------------------------------------- HPI  Review of Systems  Social History   Social History  . Marital Status: Divorced    Spouse Name: N/A  . Number of Children: N/A  . Years of Education: N/A   Occupational History  . Not on file.   Social History Main Topics  . Smoking status: Former Smoker -- 2.00 packs/day for 40 years    Types: Cigarettes  . Smokeless tobacco: Never Used  . Alcohol Use: No  . Drug Use: No  . Sexual Activity: Not on file   Other Topics Concern  . Not on file   Social History Narrative    Patient Active Problem List   Diagnosis Date Noted  . Ear mass 04/15/2015  . Allergic rhinitis 04/03/2015  . A-fib 04/03/2015  . Bursitis of elbow 04/03/2015  . Diabetes mellitus, type 2 04/03/2015  . Dyslipidemia 04/03/2015  . Essential (primary) hypertension 04/03/2015  . H/O ear disorder 04/03/2015  . Difficulty hearing 04/03/2015  . Ear drum perforation 04/03/2015  . Renal vascular disease 04/03/2015  . Screening for depression 04/03/2015  . At risk for injury 04/03/2015  . Single vessel coronary artery disease 04/03/2015  . Skin lesion 04/03/2015  . History of anticoagulant therapy 04/03/2015  . Breast lump 09/02/2013  . Lump or mass in breast 04/02/2013  . Family history of breast cancer 04/02/2013  . Abnormal mammogram 09/29/2012  . Laboratory examination 09/10/2012  . Anemia 09/09/2012  . Coronary atherosclerosis 09/09/2012  . Chronic airway obstruction 09/09/2012  . Abnormal glucose 09/09/2012   . Hyposmolality and/or hyponatremia 09/09/2012  . Unknown cause of morbidity or mortality 09/09/2012  . 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 Peripheral vascular disease 09/09/2012  . Referral of patient 09/09/2012  . BP (high blood pressure) 09/09/2012  . Blood glucose elevated 09/09/2012  . CAFL (chronic airflow limitation) 09/09/2012  . Encounter for current long-term use of anticoagulants 05/20/2012    Past Surgical History  Procedure Laterality Date  . Spine surgery  1999  . Joint replacement  1994, 1996, 2000    hip  . Coronary stent placement  2007    Her family history includes Cancer (age of onset: 85) in her sister; Heart failure in her father; Stroke in her mother.    Previous Medications   ACCU-CHEK AVIVA PLUS TEST STRIP       ALBUTEROL (PROVENTIL) (2.5 MG/3ML) 0.083% NEBULIZER SOLUTION    Take 2.5 mg by nebulization every 6 (six) hours as needed for wheezing.   BUDESONIDE-FORMOTEROL (SYMBICORT) 160-4.5 MCG/ACT INHALER    Inhale into the lungs.   CETIRIZINE (ZYRTEC) 10 MG TABLET    Take by mouth.   DILTIAZEM (CARDIZEM CD) 180 MG 24 HR CAPSULE    TAKE ONE (1) CAPSULE EACH DAY   FUROSEMIDE (LASIX) 20 MG TABLET    Take by mouth.   IPRATROPIUM-ALBUTEROL IN    Inhale into the lungs.   LISINOPRIL (PRINIVIL,ZESTRIL) 40 MG TABLET    daily.    METFORMIN (GLUCOPHAGE) 500  MG TABLET       METOPROLOL (LOPRESSOR) 100 MG TABLET    Take 1 tablet (100 mg total) by mouth 2 (two) times daily.   OXYGEN-HELIUM IN    Inhale into the lungs. 2 liters of oxygen.   POTASSIUM CHLORIDE SA (K-DUR,KLOR-CON) 20 MEQ TABLET    20 mEq 2 (two) times daily.    PRAVASTATIN (PRAVACHOL) 80 MG TABLET    80 mg daily.    PREDNISONE (DELTASONE) 5 MG TABLET       SIMVASTATIN (ZOCOR) 80 MG TABLET    TAKE ONE TABLET DAILY FOR CHOLESTEROL   THEOPHYLLINE (THEODUR) 100 MG 12 HR TABLET    TAKE 1 TABLET BY MOUTH TWICE DAILY   TIOTROPIUM (SPIRIVA) 18 MCG INHALATION CAPSULE    Place into inhaler and  inhale.   WARFARIN (COUMADIN) 3 MG TABLET    Take 1 tablet (3 mg total) by mouth daily. Every other day take 1.5 tabs by mouth of the 3 mg tabs and then 1tab of  on the other days. Same dose as before.    Patient Care Team: Janeann Forehand., MD as PCP - General (Family Medicine) Seeplaputhur Wynona Luna, MD (General Surgery) Andree Elk, MD (Unknown Physician Specialty)     Objective:   Vitals: BP 152/82 mmHg  Pulse 80  Resp 16  Ht  (1.575 m)  Wt 157 lb 3.2 oz (71.305 kg)  BMI 28.74 kg/m2  SpO2 91%  Physical Exam  Activities of Daily Living In your present state of health, do you have any difficulty performing the following activities: 07/08/2015 07/08/2015  Hearing? [name]  Vision? N N  Difficulty concentrating or making decisions? N N  Walking or climbing stairs? Y Y  Dressing or bathing? N Y  Doing errands, shopping? [name]  Preparing Food and eating ? N N  Using the Toilet? N N  In the past six months, have you accidently leaked urine? N -  Do you have problems with loss of bowel control? Y -  Managing your Medications? N N  Managing your Finances? N N  Housekeeping or managing your Housekeeping? N N    Fall Risk Assessment Fall Risk  07/08/2015 04/15/2015 04/15/2015  Falls in the past year? No No No     Patient reports there are safety devices in place in shower at home.   Depression Screen PHQ 2/9 Scores 07/08/2015 04/15/2015 04/15/2015  PHQ - 2 Score 0 0 0     MMSE MMSE - Mini Mental State Exam 07/08/2015  Orientation to time 5  Orientation to Place 5  Registration 3  Attention/ Calculation 5  Recall 3  Language- name 2 objects 2  Language- repeat 1  Language- follow 3 step command 3  Language- read & follow direction 1  Write a sentence 1  Copy design 1  Total score 30     Assessment & Plan:     Annual Wellness Visit  Reviewed patient's Family Medical History Reviewed and updated list of patient's medical providers Assessment of cognitive  impairment was done Assessed patient's functional ability Established a written schedule for health screening services Health Risk Assessent Completed and Reviewed  Exercise Activities and Dietary recommendations Goals    . Increase physical activity     Stay active by doing what is needed in home and with healthcare.        Immunization History  Administered Date(s) Administered  . Influenza-Unspecified 08/09/2014  . Pneumococcal Conjugate-13 08/31/2014  .  Pneumococcal Polysaccharide-23 11/12/2010    Health Maintenance  Topic Date Due  . INFLUENZA VACCINE  06/13/2015  . FOOT EXAM  07/19/2015 (Originally 04/06/1956)  . Hepatitis C Screening  02/09/2017 (Originally 06-Oct-1946)  . ZOSTAVAX  02/09/2018 (Originally 04/05/2005)  . HEMOGLOBIN A1C  10/19/2015  . OPHTHALMOLOGY EXAM  01/11/2016  . URINE MICROALBUMIN  07/07/2016  . MAMMOGRAM  10/11/2016  . COLONOSCOPY  11/12/2016  . TETANUS/TDAP  11/12/2021  . DEXA SCAN  Completed  . PNA vac Low Risk Adult  Completed     Discussed health benefits of physical activity, and encouraged her to engage in regular exercise appropriate for her age and condition.    ------------------------------------------------------------------------------------------------------------   Problem List Items Addressed This Visit    None       Venora Maples, MD [facility]  07/08/2015

## 2015-07-10 DIAGNOSIS — J449 Chronic obstructive pulmonary disease, unspecified: Secondary | ICD-10-CM | POA: Diagnosis not present

## 2015-07-17 ENCOUNTER — Inpatient Hospital Stay
Admission: EM | Admit: 2015-07-17 | Discharge: 2015-07-21 | DRG: 871 | Disposition: A | Discharge status: Home / Self Care | Payer: Medicare Other | Attending: Internal Medicine | Admitting: Internal Medicine

## 2015-07-17 ENCOUNTER — Encounter: Payer: Self-pay | Admitting: Emergency Medicine

## 2015-07-17 ENCOUNTER — Emergency Department: Payer: Medicare Other

## 2015-07-17 DIAGNOSIS — J441 Chronic obstructive pulmonary disease with (acute) exacerbation: Secondary | ICD-10-CM | POA: Diagnosis not present

## 2015-07-17 DIAGNOSIS — Z823 Family history of stroke: Secondary | ICD-10-CM | POA: Diagnosis not present

## 2015-07-17 DIAGNOSIS — Z803 Family history of malignant neoplasm of breast: Secondary | ICD-10-CM | POA: Diagnosis not present

## 2015-07-17 DIAGNOSIS — I2510 Atherosclerotic heart disease of native coronary artery without angina pectoris: Secondary | ICD-10-CM | POA: Diagnosis not present

## 2015-07-17 DIAGNOSIS — I712 Thoracic aortic aneurysm, without rupture: Secondary | ICD-10-CM | POA: Diagnosis not present

## 2015-07-17 DIAGNOSIS — Z880 Allergy status to penicillin: Secondary | ICD-10-CM

## 2015-07-17 DIAGNOSIS — A419 Sepsis, unspecified organism: Secondary | ICD-10-CM | POA: Diagnosis present

## 2015-07-17 DIAGNOSIS — I482 Chronic atrial fibrillation: Secondary | ICD-10-CM | POA: Diagnosis present

## 2015-07-17 DIAGNOSIS — Z79899 Other long term (current) drug therapy: Secondary | ICD-10-CM

## 2015-07-17 DIAGNOSIS — J479 Bronchiectasis, uncomplicated: Secondary | ICD-10-CM | POA: Diagnosis not present

## 2015-07-17 DIAGNOSIS — Z87891 Personal history of nicotine dependence: Secondary | ICD-10-CM | POA: Diagnosis not present

## 2015-07-17 DIAGNOSIS — J449 Chronic obstructive pulmonary disease, unspecified: Secondary | ICD-10-CM | POA: Diagnosis not present

## 2015-07-17 DIAGNOSIS — R0902 Hypoxemia: Secondary | ICD-10-CM | POA: Diagnosis not present

## 2015-07-17 DIAGNOSIS — J069 Acute upper respiratory infection, unspecified: Secondary | ICD-10-CM | POA: Diagnosis present

## 2015-07-17 DIAGNOSIS — Z7901 Long term (current) use of anticoagulants: Secondary | ICD-10-CM | POA: Diagnosis not present

## 2015-07-17 DIAGNOSIS — R600 Localized edema: Secondary | ICD-10-CM | POA: Diagnosis present

## 2015-07-17 DIAGNOSIS — Z9889 Other specified postprocedural states: Secondary | ICD-10-CM | POA: Diagnosis not present

## 2015-07-17 DIAGNOSIS — E222 Syndrome of inappropriate secretion of antidiuretic hormone: Secondary | ICD-10-CM | POA: Diagnosis present

## 2015-07-17 DIAGNOSIS — E875 Hyperkalemia: Secondary | ICD-10-CM | POA: Diagnosis not present

## 2015-07-17 DIAGNOSIS — Z96649 Presence of unspecified artificial hip joint: Secondary | ICD-10-CM | POA: Diagnosis present

## 2015-07-17 DIAGNOSIS — J9611 Chronic respiratory failure with hypoxia: Secondary | ICD-10-CM | POA: Diagnosis not present

## 2015-07-17 DIAGNOSIS — R0602 Shortness of breath: Secondary | ICD-10-CM | POA: Diagnosis not present

## 2015-07-17 DIAGNOSIS — E119 Type 2 diabetes mellitus without complications: Secondary | ICD-10-CM | POA: Diagnosis present

## 2015-07-17 DIAGNOSIS — J9621 Acute and chronic respiratory failure with hypoxia: Secondary | ICD-10-CM | POA: Diagnosis present

## 2015-07-17 DIAGNOSIS — J189 Pneumonia, unspecified organism: Secondary | ICD-10-CM

## 2015-07-17 DIAGNOSIS — Z8249 Family history of ischemic heart disease and other diseases of the circulatory system: Secondary | ICD-10-CM | POA: Diagnosis not present

## 2015-07-17 DIAGNOSIS — Z955 Presence of coronary angioplasty implant and graft: Secondary | ICD-10-CM | POA: Diagnosis not present

## 2015-07-17 DIAGNOSIS — I701 Atherosclerosis of renal artery: Secondary | ICD-10-CM | POA: Diagnosis not present

## 2015-07-17 DIAGNOSIS — E785 Hyperlipidemia, unspecified: Secondary | ICD-10-CM | POA: Diagnosis present

## 2015-07-17 DIAGNOSIS — E871 Hypo-osmolality and hyponatremia: Secondary | ICD-10-CM | POA: Diagnosis not present

## 2015-07-17 DIAGNOSIS — Z9981 Dependence on supplemental oxygen: Secondary | ICD-10-CM | POA: Diagnosis not present

## 2015-07-17 DIAGNOSIS — J159 Unspecified bacterial pneumonia: Secondary | ICD-10-CM | POA: Diagnosis not present

## 2015-07-17 DIAGNOSIS — I10 Essential (primary) hypertension: Secondary | ICD-10-CM | POA: Diagnosis not present

## 2015-07-17 DIAGNOSIS — J180 Bronchopneumonia, unspecified organism: Secondary | ICD-10-CM | POA: Diagnosis not present

## 2015-07-17 DIAGNOSIS — J181 Lobar pneumonia, unspecified organism: Secondary | ICD-10-CM | POA: Diagnosis not present

## 2015-07-17 DIAGNOSIS — R05 Cough: Secondary | ICD-10-CM | POA: Diagnosis not present

## 2015-07-17 LAB — BASIC METABOLIC PANEL
ANION GAP: 9 (ref 5–15)
BUN: 15 mg/dL (ref 6–20)
CHLORIDE: 86 mmol/L — AB (ref 101–111)
CO2: 29 mmol/L (ref 22–32)
Calcium: 8.4 mg/dL — ABNORMAL LOW (ref 8.9–10.3)
Creatinine, Ser: 0.59 mg/dL (ref 0.44–1.00)
GFR calc Af Amer: >60 mL/min (ref >60)
Glucose, Bld: 139 mg/dL — ABNORMAL HIGH (ref 65–99)
POTASSIUM: 4.7 mmol/L (ref 3.5–5.1)
SODIUM: 124 mmol/L — AB (ref 135–145)

## 2015-07-17 LAB — CBC
HEMATOCRIT: 36.2 % (ref 35.0–47.0)
HEMOGLOBIN: 12.2 g/dL (ref 12.0–16.0)
MCH: 31.2 pg (ref 26.0–34.0)
MCHC: 33.8 g/dL (ref 32.0–36.0)
MCV: 92.2 fL (ref 80.0–100.0)
Platelets: 274 10*3/uL (ref 150–440)
RBC: 3.92 MIL/uL (ref 3.80–5.20)
RDW: 14.3 % (ref 11.5–14.5)
WBC: 14.2 10*3/uL — AB (ref 3.6–11.0)

## 2015-07-17 LAB — PROTIME-INR
INR: 3.24
PROTHROMBIN TIME: 33.1 s — AB (ref 11.4–15.0)

## 2015-07-17 LAB — LACTIC ACID, PLASMA
LACTIC ACID, VENOUS: 1.3 mmol/L (ref 0.5–2.0)
Lactic Acid, Venous: 1.3 mmol/L (ref 0.5–2.0)

## 2015-07-17 LAB — GLUCOSE, CAPILLARY: Glucose-Capillary: 156 mg/dL — ABNORMAL HIGH (ref 65–99)

## 2015-07-17 MED ORDER — ALBUTEROL SULFATE (2.5 MG/3ML) 0.083% IN NEBU
2.5000 mg | INHALATION_SOLUTION | Freq: Four times a day (QID) | RESPIRATORY_TRACT | Status: DC | PRN
  Administered 2015-07-17 – 2015-07-20 (×3): 2.5 mg via RESPIRATORY_TRACT
  Filled 2015-07-17 (×4): qty 3

## 2015-07-17 MED ORDER — ZOLPIDEM TARTRATE 5 MG PO TABS
5.0000 mg | ORAL_TABLET | Freq: Every evening | ORAL | Status: DC | PRN
  Administered 2015-07-17 – 2015-07-18 (×2): 5 mg via ORAL
  Filled 2015-07-17 (×2): qty 1

## 2015-07-17 MED ORDER — ACETAMINOPHEN 650 MG RE SUPP
650.0000 mg | Freq: Four times a day (QID) | RECTAL | Status: DC | PRN
Start: 2015-07-17 — End: 2015-07-21

## 2015-07-17 MED ORDER — IPRATROPIUM-ALBUTEROL 0.5-2.5 (3) MG/3ML IN SOLN
3.0000 mL | Freq: Once | RESPIRATORY_TRACT | Status: AC
  Administered 2015-07-17: 3 mL via RESPIRATORY_TRACT
  Filled 2015-07-17: qty 3

## 2015-07-17 MED ORDER — ALUM & MAG HYDROXIDE-SIMETH 200-200-20 MG/5ML PO SUSP
30.0000 mL | Freq: Four times a day (QID) | ORAL | Status: DC | PRN
  Administered 2015-07-18 – 2015-07-19 (×3): 30 mL via ORAL
  Filled 2015-07-17 (×3): qty 30

## 2015-07-17 MED ORDER — METHYLPREDNISOLONE SODIUM SUCC 125 MG IJ SOLR
60.0000 mg | Freq: Three times a day (TID) | INTRAMUSCULAR | Status: DC
  Administered 2015-07-17 – 2015-07-21 (×12): 60 mg via INTRAVENOUS
  Filled 2015-07-17 (×12): qty 2

## 2015-07-17 MED ORDER — ACETAMINOPHEN 325 MG PO TABS: 650.0000 mg | ORAL_TABLET | Freq: Four times a day (QID) | ORAL | Status: DC | PRN

## 2015-07-17 MED ORDER — ONDANSETRON HCL 4 MG PO TABS: 4.0000 mg | ORAL_TABLET | Freq: Four times a day (QID) | ORAL | Status: DC | PRN

## 2015-07-17 MED ORDER — WARFARIN SODIUM 3 MG PO TABS: 3.0000 mg | ORAL_TABLET | Freq: Every day | ORAL | Status: DC

## 2015-07-17 MED ORDER — LEVOFLOXACIN IN D5W 750 MG/150ML IV SOLN
750.0000 mg | INTRAVENOUS | Status: DC
  Administered 2015-07-17 – 2015-07-21 (×5): 750 mg via INTRAVENOUS
  Filled 2015-07-17 (×6): qty 150

## 2015-07-17 MED ORDER — TIOTROPIUM BROMIDE MONOHYDRATE 18 MCG IN CAPS
18.0000 ug | ORAL_CAPSULE | Freq: Every day | RESPIRATORY_TRACT | Status: DC
  Administered 2015-07-18 – 2015-07-21 (×4): 18 ug via RESPIRATORY_TRACT
  Filled 2015-07-17: qty 5

## 2015-07-17 MED ORDER — DILTIAZEM HCL ER COATED BEADS 180 MG PO CP24
180.0000 mg | ORAL_CAPSULE | Freq: Every day | ORAL | Status: DC
  Administered 2015-07-18 – 2015-07-21 (×4): 180 mg via ORAL
  Filled 2015-07-17 (×4): qty 1

## 2015-07-17 MED ORDER — INSULIN ASPART 100 UNIT/ML ~~LOC~~ SOLN: 0.0000 [IU] | Freq: Every day | SUBCUTANEOUS | Status: DC

## 2015-07-17 MED ORDER — SENNOSIDES-DOCUSATE SODIUM 8.6-50 MG PO TABS: 1.0000 | ORAL_TABLET | Freq: Every evening | ORAL | Status: DC | PRN

## 2015-07-17 MED ORDER — LEVOFLOXACIN IN D5W 750 MG/150ML IV SOLN: 750.0000 mg | INTRAVENOUS | Status: DC

## 2015-07-17 MED ORDER — PRAVASTATIN SODIUM 20 MG PO TABS
80.0000 mg | ORAL_TABLET | Freq: Every day | ORAL | Status: DC
Start: 2015-07-17 — End: 2015-07-21
  Administered 2015-07-18 – 2015-07-21 (×4): 80 mg via ORAL
  Filled 2015-07-17 (×4): qty 4

## 2015-07-17 MED ORDER — INSULIN ASPART 100 UNIT/ML ~~LOC~~ SOLN
0.0000 [IU] | Freq: Three times a day (TID) | SUBCUTANEOUS | Status: DC
  Administered 2015-07-18: 1 [IU] via SUBCUTANEOUS
  Administered 2015-07-18: 2 [IU] via SUBCUTANEOUS
  Administered 2015-07-18: 5 [IU] via SUBCUTANEOUS
  Administered 2015-07-19 (×2): 3 [IU] via SUBCUTANEOUS
  Administered 2015-07-19: 2 [IU] via SUBCUTANEOUS
  Administered 2015-07-20: 3 [IU] via SUBCUTANEOUS
  Administered 2015-07-20: 5 [IU] via SUBCUTANEOUS
  Administered 2015-07-20: 2 [IU] via SUBCUTANEOUS
  Administered 2015-07-21: 3 [IU] via SUBCUTANEOUS
  Administered 2015-07-21: 2 [IU] via SUBCUTANEOUS
  Filled 2015-07-17: qty 2
  Filled 2015-07-17: qty 5
  Filled 2015-07-17 (×2): qty 3
  Filled 2015-07-17: qty 1
  Filled 2015-07-17: qty 2
  Filled 2015-07-17: qty 3
  Filled 2015-07-17 (×2): qty 2
  Filled 2015-07-17: qty 5
  Filled 2015-07-17: qty 3

## 2015-07-17 MED ORDER — WARFARIN SODIUM 5 MG PO TABS
2.5000 mg | ORAL_TABLET | Freq: Once | ORAL | Status: AC
  Administered 2015-07-17: 2.5 mg via ORAL
  Filled 2015-07-17: qty 1

## 2015-07-17 MED ORDER — SODIUM CHLORIDE 0.9 % IV SOLN
INTRAVENOUS | Status: DC
  Administered 2015-07-17 – 2015-07-18 (×3): via INTRAVENOUS

## 2015-07-17 MED ORDER — METOPROLOL TARTRATE 100 MG PO TABS
100.0000 mg | ORAL_TABLET | Freq: Two times a day (BID) | ORAL | Status: DC
  Administered 2015-07-17 – 2015-07-21 (×8): 100 mg via ORAL
  Filled 2015-07-17 (×8): qty 1

## 2015-07-17 MED ORDER — LISINOPRIL 20 MG PO TABS
40.0000 mg | ORAL_TABLET | Freq: Every day | ORAL | Status: DC
  Administered 2015-07-18 – 2015-07-19 (×2): 40 mg via ORAL
  Filled 2015-07-17 (×3): qty 2

## 2015-07-17 MED ORDER — WARFARIN - PHARMACIST DOSING INPATIENT
Freq: Every day | Status: DC
  Administered 2015-07-18 – 2015-07-20 (×2)

## 2015-07-17 MED ORDER — ONDANSETRON HCL 4 MG/2ML IJ SOLN: 4.0000 mg | Freq: Four times a day (QID) | INTRAMUSCULAR | Status: DC | PRN

## 2015-07-17 MED ORDER — BUDESONIDE-FORMOTEROL FUMARATE 160-4.5 MCG/ACT IN AERO
2.0000 | INHALATION_SPRAY | Freq: Two times a day (BID) | RESPIRATORY_TRACT | Status: DC
  Administered 2015-07-17 – 2015-07-21 (×8): 2 via RESPIRATORY_TRACT
  Filled 2015-07-17: qty 6

## 2015-07-17 MED ORDER — SODIUM CHLORIDE 0.9 % IJ SOLN
3.0000 mL | Freq: Two times a day (BID) | INTRAMUSCULAR | Status: DC
  Administered 2015-07-19 – 2015-07-20 (×3): 3 mL via INTRAVENOUS
  Administered 2015-07-20: 11:00:00 via INTRAVENOUS
  Administered 2015-07-21: 3 mL via INTRAVENOUS

## 2015-07-17 MED ORDER — TIOTROPIUM BROMIDE MONOHYDRATE 18 MCG IN CAPS: 18.0000 ug | ORAL_CAPSULE | Freq: Every day | RESPIRATORY_TRACT | Status: DC

## 2015-07-17 MED ORDER — GLUCOSE BLOOD VI STRP: 1.0000 | ORAL_STRIP | Freq: Every day | Status: DC

## 2015-07-17 NOTE — Consult Note (Signed)
ANTICOAGULATION CONSULT NOTE - Initial Consult  Pharmacy Consult for warfarin Indication: atrial fibrillation  Allergies  Allergen Reactions  . Penicillins Rash    Patient Measurements: Height:  (154.9 cm) Weight: 161 lb 6 oz (73.199 kg) IBW/kg (Calculated) : 47.8 Heparin Dosing Weight:   Vital Signs: Temp: 97.5 F (36.4 C) (09/04 1020) Temp Source: Oral (09/04 1020) BP: 151/85 mmHg (09/04 1500) Pulse Rate: 113 (09/04 1500)  Labs:  Recent Labs  07/17/15 1131 07/17/15 1142  HGB 12.2  --   HCT 36.2  --   PLT 274  --   LABPROT  --  33.1*  INR  --  3.24  CREATININE 0.59  --     Estimated Creatinine Clearance: 59.9 mL/min (by C-G formula based on Cr of 0.59).   Medical History: Past Medical History  Diagnosis Date  . COPD (chronic obstructive pulmonary disease) Jan 2014  . A-fib   . Coronary artery disease     Medications:  Scheduled:  . budesonide-formoterol  2 puff Inhalation BID  . [START ON 07/18/2015] diltiazem  180 mg Oral Daily  . insulin aspart  0-5 Units Subcutaneous QHS  . insulin aspart  0-9 Units Subcutaneous TID WC  . levofloxacin (LEVAQUIN) IV  750 mg Intravenous Q24H  . [START ON 07/18/2015] lisinopril  40 mg Oral Daily  . methylPREDNISolone (SOLU-MEDROL) injection  60 mg Intravenous 3 times per day  . metoprolol  100 mg Oral BID  . pravastatin  80 mg Oral Daily  . sodium chloride  3 mL Intravenous Q12H  . [START ON 07/18/2015] tiotropium  18 mcg Inhalation Daily  . warfarin  3 mg Oral Daily    Assessment: Pt is a 69 year old female with a hx of afib. She takes warfarin at home. She takes  qod alternating with 4.5mg . Patient took 4.5mg  last night.  Goal of Therapy:  INR 2-3 Monitor platelets by anticoagulation protocol: Yes   Plan:  Patient is slightly supratherapeutic today. Because she is on antibiotics that could raise INR will give 2.5mg  tonight. Will recheck INR in AM.  Efraim Kaufmann D Dwayne Bulkley 07/17/2015,6:36 PM

## 2015-07-17 NOTE — ED Notes (Signed)
Patient transported to X-ray 

## 2015-07-17 NOTE — ED Notes (Signed)
Ems pt from home , cough and respiratory distress x1 week , fever and chills on and off , productive cough thick greenish sputum, on home o2 at 2 liters , ems sats were 84%, increased o2 to 4 liters with improved sats of 92%

## 2015-07-17 NOTE — ED Provider Notes (Signed)
Time Seen: Approximately 11 AM  I have reviewed the triage notes  Chief Complaint: URI   History of Present Illness: [name] is a 69 y.o. female who presents with increasing shortness of breath. Patient has significant history of chronic atrial fibrillation, previous chronic obstructive pulmonary disease, type 2 diabetes, who presents with a very productive cough with yellow to green tinged sputum. Patient's normally on home oxygen at 2 L. She denies any feelings of lightheadedness, nausea, vomiting, peripheral edema of significance, or any other new concerns. She denies any chest pain.   Past Medical History  Diagnosis Date  . COPD (chronic obstructive pulmonary disease) Jan 2014  . A-fib   . Coronary artery disease     Patient Active Problem List   Diagnosis Date Noted  . Sepsis 07/17/2015  . Ear mass 04/15/2015  . Allergic rhinitis 04/03/2015  . A-fib 04/03/2015  . Bursitis of elbow 04/03/2015  . Diabetes mellitus, type 2 04/03/2015  . Dyslipidemia 04/03/2015  . Essential (primary) hypertension 04/03/2015  . H/O ear disorder 04/03/2015  . Difficulty hearing 04/03/2015  . Ear drum perforation 04/03/2015  . Renal vascular disease 04/03/2015  . Screening for depression 04/03/2015  . At risk for injury 04/03/2015  . Single vessel coronary artery disease 04/03/2015  . Skin lesion 04/03/2015  . History of anticoagulant therapy 04/03/2015  . Breast lump 09/02/2013  . Lump or mass in breast 04/02/2013  . Family history of breast cancer 04/02/2013  . Abnormal mammogram 09/29/2012  . Laboratory examination 09/10/2012  . Anemia 09/09/2012  . Coronary atherosclerosis 09/09/2012  . Chronic airway obstruction 09/09/2012  . Abnormal glucose 09/09/2012  . Hyposmolality and/or hyponatremia 09/09/2012  . Unknown cause of morbidity or mortality 09/09/2012  . 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 Peripheral vascular disease 09/09/2012  . Referral of patient  09/09/2012  . BP (high blood pressure) 09/09/2012  . Blood glucose elevated 09/09/2012  . CAFL (chronic airflow limitation) 09/09/2012  . Encounter for current long-term use of anticoagulants 05/20/2012    Past Surgical History  Procedure Laterality Date  . Spine surgery  1999  . Joint replacement  1994, 1996, 2000    hip  . Coronary stent placement  2007  . Hip surgery    . Back surgery      Past Surgical History  Procedure Laterality Date  . Spine surgery  1999  . Joint replacement  1994, 1996, 2000    hip  . Coronary stent placement  2007  . Hip surgery    . Back surgery      Current Outpatient Rx  Name  Route  Sig  Dispense  Refill  . ACCU-CHEK AVIVA PLUS test strip   Other   1 each by Other route daily.          Marland Kitchen albuterol (PROVENTIL) (2.5 MG/3ML) 0.083% nebulizer solution   Nebulization   Take 2.5 mg by nebulization every 6 (six) hours as needed for wheezing.         . budesonide-formoterol (SYMBICORT) 160-4.5 MCG/ACT inhaler   Inhalation   Inhale 2 puffs into the lungs 2 (two) times daily.          Marland Kitchen diltiazem (CARDIZEM CD) 180 MG 24 hr capsule      TAKE ONE (1) CAPSULE EACH DAY      3   . furosemide (LASIX) 20 MG tablet   Oral   Take 20 mg by mouth daily.          Marland Kitchen  lisinopril (PRINIVIL,ZESTRIL) 40 MG tablet   Oral   Take 40 mg by mouth daily.          . metFORMIN (GLUCOPHAGE) 500 MG tablet   Oral   Take 500 mg by mouth daily.          . metoprolol (LOPRESSOR) 100 MG tablet   Oral   Take 1 tablet (100 mg total) by mouth 2 (two) times daily.   180 tablet   3   . OXYGEN-HELIUM IN   Inhalation   Inhale 2 L into the lungs continuous. 2 liters of oxygen.         . potassium chloride SA (K-DUR,KLOR-CON) 20 MEQ tablet   Oral   Take 20 mEq by mouth 2 (two) times daily.          . pravastatin (PRAVACHOL) 80 MG tablet   Oral   Take 80 mg by mouth daily.          . predniSONE (DELTASONE) 5 MG tablet   Oral   Take 5 mg by  mouth daily.          Marland Kitchen tiotropium (SPIRIVA) 18 MCG inhalation capsule   Inhalation   Place 18 mcg into inhaler and inhale daily.          Marland Kitchen warfarin (COUMADIN) 3 MG tablet   Oral   Take 1 tablet (3 mg total) by mouth daily. Every other day take 1.5 tabs by mouth of the 3 mg tabs and then 1tab of  on the other days. Same dose as before.   45 tablet   3     Same dose     Allergies:  Penicillins  Family History: Family History  Problem Relation Age of Onset  . Heart failure Father   . Stroke Mother   . Cancer Sister 83    breast    Social History: Social History  Substance Use Topics  . Smoking status: Former Smoker -- 2.00 packs/day for 40 years    Types: Cigarettes  . Smokeless tobacco: Never Used  . Alcohol Use: No     Review of Systems:   10 point review of systems was performed and was otherwise negative:  Constitutional: No fever Eyes: No visual disturbances ENT: No sore throat, ear pain Cardiac: No chest pain Respiratory: No shortness of breath, wheezing, or stridor Abdomen: No abdominal pain, no vomiting, No diarrhea Endocrine: No weight loss, No night sweats Extremities: No peripheral edema, cyanosis Skin: No rashes, easy bruising Neurologic: No focal weakness, trouble with speech or swollowing Urologic: No dysuria, Hematuria, or urinary frequency   Physical Exam:  ED Triage Vitals  Enc Vitals Group     BP 07/17/15 1020 168/85 mmHg     Pulse Rate 07/17/15 1020 102     Resp 07/17/15 1020 20     Temp 07/17/15 1020 97.5 F (36.4 C)     Temp Source 07/17/15 1020 Oral     SpO2 07/17/15 1020 92 %     Weight 07/17/15 1020 161 lb 6 oz (73.199 kg)     Height 07/17/15 1020  (1.549 m)     Head Cir --      Peak Flow --      Pain Score 07/17/15 1021 0     Pain Loc --      Pain Edu? --      Excl. in GC? --     General: Awake , Alert , and Oriented times 3; GCS  15. Mild respiratory distress and speaks in interrupted sentences. No  accessory muscle usage Head: Normal cephalic , atraumatic Eyes: Pupils equal , round, reactive to light Nose/Throat: No nasal drainage, patent upper airway without erythema or exudate.  Neck: Supple, Full range of motion, No anterior adenopathy or palpable thyroid masses Lungs: Coarse breath sounds auscultated bilaterally without any wheezes or rales noted. Heart: Irregular rate, irregular rhythm without murmurs , gallops , or rubs Abdomen: Soft, non tender without rebound, guarding , or rigidity; bowel sounds positive and symmetric in all 4 quadrants. No organomegaly .        Extremities: Mild nonpitting bilateral edema, clubbing or cyanosis Neurologic: normal ambulation, Motor symmetric without deficits, sensory intact Skin: warm, dry, no rashes   Labs:   All laboratory work was reviewed including any pertinent negatives or positives listed below:  Labs Reviewed  BASIC METABOLIC PANEL - Abnormal; Notable for the following:    Sodium 124 (*)    Chloride 86 (*)    Glucose, Bld 139 (*)    Calcium 8.4 (*)    All other components within normal limits  CBC - Abnormal; Notable for the following:    WBC 14.2 (*)    All other components within normal limits  PROTIME-INR - Abnormal; Notable for the following:    Prothrombin Time 33.1 (*)    All other components within normal limits  CULTURE, BLOOD (ROUTINE X 2)  CULTURE, BLOOD (ROUTINE X 2)  LACTIC ACID, PLASMA  LACTIC ACID, PLASMA   of significance is a low sodium level  EKG:  ED ECG REPORT I, Jennye Moccasin, the attending physician, personally viewed and interpreted this ECG.  Date: 07/17/2015 EKG Time: 1021 Rate: 104 Rhythm: Atrial fibrillation with rapid ventricular response QRS Axis: normal Intervals: normal ST/T Wave abnormalities: Nonspecific ST-T wave abnormality Conduction Disutrbances: none Narrative Interpretation: No significant change  Radiology:    EXAM: CHEST 2 VIEW  COMPARISON:  None.  FINDINGS: Normal cardiac silhouette. There is segmental airspace disease in the LEFT upper lobe and to lesser degree the LEFT lower lobe which is new from prior. RIGHT lung is clear. There is bronchiectasis in lower lobes similar to comparison exam.  IMPRESSION: Findings most suggestive of LEFT upper lobe pneumonia.  Bibasilar bronchiectasis.   Electronically Signed   * I personally reviewed the radiologic studies      ED Course: Patient was started on blood cultures 2 and IV antibiotics for what appears to be some community-acquired pneumonia. Patient was started on Zosyn after cultures were obtained. She received a breathing treatment and stabilized at 95% on a 4 L nasal cannula. Patient was given a breathing treatment 1 here in emergency department. Patient was given IV fluid bolus for her hyponatremia. Hyponatremia may be related to medications and her pneumonia.    Final Clinical Impression: Chronic obstructive pulmonary disease Hypoxemia Hyponatremia Final diagnoses:  Community acquired pneumonia     Plan: I spoke to the hospitalist team, further disposition and management depends upon her evaluation. Likely continued IV antibiotics respiratory therapy etc.          Jennye Moccasin, MD 07/17/15 1530

## 2015-07-17 NOTE — ED Notes (Signed)
MD at bedside.< Dr.Quigley

## 2015-07-17 NOTE — H&P (Signed)
[facility] - [place] at [facility]   PATIENT NAME: [name]    MR#:  [id number]  DATE OF BIRTH:  04/22/1946  DATE OF ADMISSION:  07/17/2015  PRIMARY CARE PHYSICIAN: Fidel Levy, MD   REQUESTING/REFERRING PHYSICIAN: Dr. Huel Cote  CHIEF COMPLAINT:  Shortness of breath and wheezing HISTORY OF PRESENT ILLNESS:  [name]  is a 69 y.o. female with a known history of chronic respiratory failure again. He COPD on 2 L of oxygen, atrophic relation and CAD who presents with above complaint. Since she is a patient has not been feeling well. She is complaining shortness of breath, cough with purulent sputum and wheezing. She also had a fever and chills at home. In the emergency room she received antibiotics and chest x-ray showed left lower lobe pneumonia. She presented with tachycardia her heart rate is now improved.  PAST MEDICAL HISTORY:   Past Medical History  Diagnosis Date  . COPD (chronic obstructive pulmonary disease) Jan 2014  . A-fib   . Coronary artery disease     PAST SURGICAL HISTORY:   Past Surgical History  Procedure Laterality Date  . Spine surgery  1999  . Joint replacement  1994, 1996, 2000    hip  . Coronary stent placement  2007  . Hip surgery    . Back surgery      SOCIAL HISTORY:   Social History  Substance Use Topics  . Smoking status: Former Smoker -- 2.00 packs/day for 40 years    Types: Cigarettes  . Smokeless tobacco: Never Used  . Alcohol Use: No    FAMILY HISTORY:   Family History  Problem Relation Age of Onset  . Heart failure Father   . Stroke Mother   . Cancer Sister 42    breast    DRUG ALLERGIES:   Allergies  Allergen Reactions  . Penicillins Rash     REVIEW OF SYSTEMS:  CONSTITUTIONAL: No fever, +++fatigue and generalizedweakness.  EYES: No blurred or double vision.  EARS, NOSE, AND THROAT: No tinnitus or ear pain.  RESPIRATORY: ++ cough, shortness of breath, wheezingNO hemoptysis.   CARDIOVASCULAR: No chest pain, orthopnea, edema.  GASTROINTESTINAL: No nausea, vomiting, diarrhea or abdominal pain.  GENITOURINARY: No dysuria, hematuria.  ENDOCRINE: No polyuria, nocturia,  HEMATOLOGY: No anemia, easy bruising or bleeding SKIN: No rash or lesion. MUSCULOSKELETAL: No joint pain or arthritis.   NEUROLOGIC: No tingling, numbness, weakness.  PSYCHIATRY: No anxiety or depression.   MEDICATIONS AT HOME:   Prior to Admission medications   Medication Sig Start Date End Date Taking? Authorizing Provider  ACCU-CHEK AVIVA PLUS test strip 1 each by Other route daily.  10/18/14  Yes Historical Provider, MD  albuterol (PROVENTIL) (2.5 MG/3ML) 0.083% nebulizer solution Take 2.5 mg by nebulization every 6 (six) hours as needed for wheezing.   Yes Historical Provider, MD  budesonide-formoterol (SYMBICORT) 160-4.5 MCG/ACT inhaler Inhale 2 puffs into the lungs 2 (two) times daily.  12/13/14  Yes Historical Provider, MD  diltiazem (CARDIZEM CD) 180 MG 24 hr capsule TAKE ONE (1) CAPSULE EACH DAY 06/14/15  Yes Historical Provider, MD  furosemide (LASIX) 20 MG tablet Take 20 mg by mouth daily.  06/21/15 06/20/16 Yes Historical Provider, MD  lisinopril (PRINIVIL,ZESTRIL) 40 MG tablet Take 40 mg by mouth daily.  03/05/13  Yes Historical Provider, MD  metFORMIN (GLUCOPHAGE) 500 MG tablet Take 500 mg by mouth daily.  10/18/14  Yes Historical Provider, MD  metoprolol (LOPRESSOR) 100 MG tablet Take 1  tablet (100 mg total) by mouth 2 (two) times daily. 04/15/15  Yes Janeann Forehand., MD  OXYGEN-HELIUM IN Inhale 2 L into the lungs continuous. 2 liters of oxygen.   Yes Historical Provider, MD  potassium chloride SA (K-DUR,KLOR-CON) 20 MEQ tablet Take 20 mEq by mouth 2 (two) times daily.  03/26/13  Yes Historical Provider, MD  pravastatin (PRAVACHOL) 80 MG tablet Take 80 mg by mouth daily.  03/27/13  Yes Historical Provider, MD  predniSONE (DELTASONE) 5 MG tablet Take 5 mg by mouth daily.  04/01/15  Yes Historical  Provider, MD  tiotropium (SPIRIVA) 18 MCG inhalation capsule Place 18 mcg into inhaler and inhale daily.  12/21/13  Yes Historical Provider, MD  warfarin (COUMADIN) 3 MG tablet Take 1 tablet (3 mg total) by mouth daily. Every other day take 1.5 tabs by mouth of the 3 mg tabs and then 1tab of 3mg  on the other days. Same dose as before. 04/20/15  Yes Janeann Forehand., MD      VITAL SIGNS:  Blood pressure 147/99, pulse 115, temperature 97.5 F (36.4 C), temperature source Oral, resp. rate 32, height 5\' 1"  (1.549 m), weight 73.199 kg (161 lb 6 oz), SpO2 91 %.  PHYSICAL EXAMINATION:  GENERAL:  68 y.o.-year-old patient lying in the bed with no acute distress.  EYES: Pupils equal, round, reactive to light and accommodation. No scleral icterus. Extraocular muscles intact.  HEENT: Head atraumatic, normocephalic. Oropharynx clear.  NECK:  Supple, no jugular venous distention. No thyroid enlargement, no tenderness.  LUNGS: Decreased air movement bilaterally. As per ER physician she did have wheezing earlier. She is crackles at the left lung base.  CARDIOVASCULAR: S1, S2 normal. No murmurs, rubs, or gallops.  ABDOMEN: Soft, nontender, nondistended. Bowel sounds present. No organomegaly or mass.  EXTREMITIES: No pedal edema, cyanosis, or clubbing.  NEUROLOGIC: Cranial nerves II through XII are grossly intact. No focal deficits. PSYCHIATRIC: The patient is alert and oriented x 3.  SKIN: No obvious rash, lesion, or ulcer.   LABORATORY PANEL:   CBC  Recent Labs Lab 07/17/15 1131  WBC 14.2*  HGB 12.2  HCT 36.2  PLT 274   ------------------------------------------------------------------------------------------------------------------  Chemistries   Recent Labs Lab 07/17/15 1131  NA 124*  K 4.7  CL 86*  CO2 29  GLUCOSE 139*  BUN 15  CREATININE 0.59  CALCIUM 8.4*    ------------------------------------------------------------------------------------------------------------------  Cardiac Enzymes No results for input(s): TROPONINI in the last 168 hours. ------------------------------------------------------------------------------------------------------------------  RADIOLOGY:  Dg Chest 2 View  07/17/2015   CLINICAL DATA:  Productive cough and weakness  EXAM: CHEST  2 VIEW  COMPARISON:  None.  FINDINGS: Normal cardiac silhouette. There is segmental airspace disease in the LEFT upper lobe and to lesser degree the LEFT lower lobe which is new from prior. RIGHT lung is clear. There is bronchiectasis in lower lobes similar to comparison exam.  IMPRESSION: Findings most suggestive of LEFT upper lobe pneumonia.  Bibasilar bronchiectasis.   Electronically Signed   By: Genevive Bi M.D.   On: 07/17/2015 10:54    EKG:  Atrial fibrillation heart rate of 104 no ST elevation depression  IMPRESSION AND PLAN:  69 year old female with chronic respiratory failure on 2 L oxygen who presents with shortness of breath and found to have pneumonia.  1. Sepsis: Patient meets sepsis criteria by tachycardia and leukocytosis on admission. Sepsis secondary to community-acquired pneumonia. Plan as outlined below.  2. Community-acquired pneumonia: Patient will continue Levaquin. Blood cultures were ordered  in the emergency room. I will start IV fluids as well.  3. Acute on chronic hypoxic respiratory failure: This is primarily due to community-acquired pneumonia. Patient was found to have O2 saturation 89%. I have started low dose steroids and we'll continue these for the next 24 hours and then may be tapered quickly.  4. Hyponatremia: Patient is on Lasix which I have stopped for now. I will provide IV fluids and see if the sodium improves. I suspect this is a be related to dehydration and possibly her underlying pneumonia. His sodium level is still low tomorrow then she will  need further workup.  5. Atrial fibrillation: Heart rate is better controlled. Continue outpatient medications including metoprolol and diltiazem along with Coumadin. INR is pending for this morning. I will follow up on the INR.  6. Diabetes: Patient will be an ADA diet and send scale insulin. Metformin is on hold for now.  7. Essential hypertension: Continue metoprolol, diltiazem and lisinopril.  8. CAD: Continue statin and metoprolol.    All the records are reviewed and case discussed with ED provider. Management plans discussed with the patient and she is in agreement.  CODE STATUS: Full  TOTAL TIME TAKING CARE OF THIS PATIENT: 45 minutes.    Aayana Reinertsen M.D on 07/17/2015 at 2:00 PM  Between 7am to 6pm - Pager - [phone] After 6pm go to [email or web address] - password [email or web address] [facility]  [place]  [facility]  Office  [phone]  CC: Primary care physician; Fidel Levy, MD

## 2015-07-18 LAB — GLUCOSE, CAPILLARY
GLUCOSE-CAPILLARY: 174 mg/dL — AB (ref 65–99)
GLUCOSE-CAPILLARY: 148 mg/dL — AB (ref 65–99)
Glucose-Capillary: 274 mg/dL — ABNORMAL HIGH (ref 65–99)
Glucose-Capillary: 175 mg/dL — ABNORMAL HIGH (ref 65–99)

## 2015-07-18 LAB — CBC
HEMATOCRIT: 34.7 % — AB (ref 35.0–47.0)
HEMOGLOBIN: 12 g/dL (ref 12.0–16.0)
MCH: 32 pg (ref 26.0–34.0)
MCHC: 34.5 g/dL (ref 32.0–36.0)
MCV: 92.7 fL (ref 80.0–100.0)
Platelets: 287 10*3/uL (ref 150–440)
RBC: 3.75 MIL/uL — AB (ref 3.80–5.20)
RDW: 14.1 % (ref 11.5–14.5)
WBC: 11.9 10*3/uL — AB (ref 3.6–11.0)

## 2015-07-18 LAB — BASIC METABOLIC PANEL
ANION GAP: 9 (ref 5–15)
BUN: 20 mg/dL (ref 6–20)
CHLORIDE: 89 mmol/L — AB (ref 101–111)
CO2: 24 mmol/L (ref 22–32)
Calcium: 8.2 mg/dL — ABNORMAL LOW (ref 8.9–10.3)
Creatinine, Ser: 0.53 mg/dL (ref 0.44–1.00)
GFR calc Af Amer: >60 mL/min (ref >60)
Glucose, Bld: 153 mg/dL — ABNORMAL HIGH (ref 65–99)
POTASSIUM: 4.9 mmol/L (ref 3.5–5.1)
SODIUM: 122 mmol/L — AB (ref 135–145)

## 2015-07-18 LAB — PROTIME-INR
INR: 3.29
Prothrombin Time: 33.5 seconds — ABNORMAL HIGH (ref 11.4–15.0)

## 2015-07-18 MED ORDER — WARFARIN SODIUM 4 MG PO TABS
2.0000 mg | ORAL_TABLET | Freq: Once | ORAL | Status: AC
  Administered 2015-07-18: 2 mg via ORAL
  Filled 2015-07-18: qty 1

## 2015-07-18 NOTE — Progress Notes (Signed)
[facility] - [place] at [facility]   PATIENT NAME: [name]    MR#:  [id number]  DATE OF BIRTH:  18-Jul-1946  SUBJECTIVE:  CHIEF COMPLAINT:   Chief Complaint  Patient presents with  . URI   feels somewhat better REVIEW OF SYSTEMS:  Review of Systems  Constitutional: Negative for fever, weight loss, malaise/fatigue and diaphoresis.  HENT: Negative for ear discharge, ear pain, hearing loss, nosebleeds, sore throat and tinnitus.   Eyes: Negative for blurred vision and pain.  Respiratory: Positive for cough, shortness of breath and wheezing. Negative for hemoptysis.   Cardiovascular: Negative for chest pain, palpitations, orthopnea and leg swelling.  Gastrointestinal: Negative for heartburn, nausea, vomiting, abdominal pain, diarrhea, constipation and blood in stool.  Genitourinary: Negative for dysuria, urgency and frequency.  Musculoskeletal: Negative for myalgias and back pain.  Skin: Negative for itching and rash.  Neurological: Negative for dizziness, tingling, tremors, focal weakness, seizures, weakness and headaches.  Psychiatric/Behavioral: Negative for depression. The patient is not nervous/anxious.    DRUG ALLERGIES:   Allergies  Allergen Reactions  . Penicillins Rash   VITALS:  Blood pressure 147/73, pulse 111, temperature 96.8 F (36 C), temperature source Axillary, resp. rate 19, height  (1.549 m), weight 73.199 kg (161 lb 6 oz), SpO2 92 %. PHYSICAL EXAMINATION:  Physical Exam  Constitutional: She is oriented to person, place, and time and well-developed, well-nourished, and in no distress.  HENT:  Head: Normocephalic and atraumatic.  Eyes: Conjunctivae and EOM are normal. Pupils are equal, round, and reactive to light.  Neck: Normal range of motion. Neck supple. No tracheal deviation present. No thyromegaly present.  Cardiovascular: Normal rate, regular rhythm and normal heart sounds.   Pulmonary/Chest: Effort normal. No respiratory  distress. She has wheezes. She exhibits no tenderness.  Abdominal: Soft. Bowel sounds are normal. She exhibits no distension. There is no tenderness.  Musculoskeletal: Normal range of motion.  Neurological: She is alert and oriented to person, place, and time. No cranial nerve deficit.  Skin: Skin is warm and dry. No rash noted.  Psychiatric: Mood and affect normal.   LABORATORY PANEL:   CBC  Recent Labs Lab 07/18/15 0459  WBC 11.9*  HGB 12.0  HCT 34.7*  PLT 287   ------------------------------------------------------------------------------------------------------------------ Chemistries   Recent Labs Lab 07/18/15 0459  NA 122*  K 4.9  CL 89*  CO2 24  GLUCOSE 153*  BUN 20  CREATININE 0.53  CALCIUM 8.2*   RADIOLOGY:  No results found. ASSESSMENT AND PLAN:  69 year old female with chronic respiratory failure on 2 L oxygen who presents with shortness of breath and found to have pneumonia.  1. Sepsis: Present on admission likely due to community-acquired pneumonia. Plan as outlined below.  2. Community-acquired pneumonia: continue Levaquin. Blood cultures negative.  Continue IV fluids as well.  3. Acute on chronic hypoxic respiratory failure: This is primarily due to community-acquired pneumonia.  Continue steroids  4. Hyponatremia: Dropping 124->122. monitor  5. Atrial fibrillation: Heart rate is better controlled. Continue outpatient medications including metoprolol and diltiazem along with Coumadin. INR is 3.29  6. Diabetes:  ADA diet and send scale insulin. Metformin is on hold for now.  7. Essential hypertension: Continue metoprolol, diltiazem and lisinopril.     All the records are reviewed and case discussed with Care Management/Social Worker. Management plans discussed with the patient, family and they are in agreement.  CODE STATUS: Full code  TOTAL TIME TAKING CARE OF THIS PATIENT: 35 minutes.  More than 50% of the time was spent in  counseling/coordination of care: YES  POSSIBLE D/C IN 1-2 DAYS, DEPENDING ON CLINICAL CONDITION.   [facility], Bertrand Vowels M.D on 07/18/2015 at 1:11 PM  Between 7am to 6pm - Pager - [phone]  After 6pm go to [email or web address] - password [email or web address] [facility]  [place] [facility]  Office  [phone]  CC:  Primary care physician; Fidel Levy, MD

## 2015-07-18 NOTE — Evaluation (Signed)
Physical Therapy Evaluation Patient Details Name: [name] MRN: [id number] DOB: 11-06-1946 Today's Date: 07/18/2015   History of Present Illness  Pt has had 1-2 weeks of sbreathing difficulty  Clinical Impression  Pt was able to ambulate >100 ft w/o AD (on 6 liters, baseline is 2) and though she does fatigue and have a limp and slight balance issues but remains safe t/o the effort.  Her sons are present and feel that with HHPT and some help from her sisters she should be able to go home safely.  Discussed with pt that she may want to consider using a cane.    Follow Up Recommendations Home health PT    Equipment Recommendations       Recommendations for Other Services       Precautions / Restrictions Precautions Precautions: Fall Restrictions Weight Bearing Restrictions: No      Mobility  Bed Mobility Overal bed mobility: Modified Independent             General bed mobility comments: Pt able to get to EOB with only minimal hand rail use  Transfers Overall transfer level: Independent Equipment used: None                Ambulation/Gait Ambulation/Gait assistance: Supervision;Min guard Ambulation Distance (Feet): 125 Feet Assistive device: None (6 liters o2)       General Gait Details: Pt is able to ambualte with limp and occasional side stepping (apparently this near baseline from leg length discrepency).  She does fatigue with the effort, but o2 does remain in the 90s on 6 liters.  Stairs            Wheelchair Mobility    Modified Rankin (Stroke Patients Only)       Balance                                             Pertinent Vitals/Pain Pain Assessment: No/denies pain    Home Living Family/patient expects to be discharged to:: Private residence Living Arrangements: Alone Available Help at Discharge: Family (sisters live close and can be involved) Type of Home: House Home Access: Ramped entrance              Prior Function Level of Independence: Independent         Comments: pt on 2 liters all the time, does not regularly get out of the home     Hand Dominance        Extremity/Trunk Assessment   Upper Extremity Assessment: Generalized weakness           Lower Extremity Assessment: Generalized weakness         Communication   Communication: No difficulties  Cognition Arousal/Alertness: Awake/alert Behavior During Therapy: WFL for tasks assessed/performed Overall Cognitive Status: Within Functional Limits for tasks assessed                      General Comments      Exercises        Assessment/Plan    PT Assessment Patient needs continued PT services  PT Diagnosis Difficulty walking;Generalized weakness   PT Problem List Decreased strength;Decreased activity tolerance;Decreased balance;Decreased mobility;Decreased coordination;Decreased cognition;Decreased knowledge of use of DME;Decreased safety awareness  PT Treatment Interventions Gait training;Therapeutic activities;Therapeutic exercise;Balance training;Neuromuscular re-education;Functional mobility training   PT Goals (Current goals can be found in the  Care Plan section) Acute Rehab PT Goals Patient Stated Goal: to go home PT Goal Formulation: With patient/family Time For Goal Achievement: 08/01/15 Potential to Achieve Goals: Good    Frequency Min 2X/week   Barriers to discharge        Co-evaluation               End of Session Equipment Utilized During Treatment: Gait belt Activity Tolerance: Patient limited by fatigue (but overall does well and has little safety concerns) Patient left: with bed alarm set           Time: 4782-9562 PT Time Calculation (min) (ACUTE ONLY): 24 min   Charges:   PT Evaluation $Initial PT Evaluation Tier I: 1 Procedure     PT G Codes:       Loran Senters, PT, DPT [phone]  Malachi Pro 07/18/2015, 6:11 PM

## 2015-07-18 NOTE — Care Management Important Message (Signed)
Important Message  Patient Details  Name: [name] MRN: [id number] Date of Birth: 1946-02-23   Medicare Important Message Given:  Yes-second notification given    Adonis Huguenin, RN 07/18/2015, 11:23 AM

## 2015-07-18 NOTE — Care Management (Addendum)
patient states from home alone. Has family available to help. Stated that she drives self and has a Psychiatric nurse that she uses when she has to walk long distances like grocery shopping. O2 provided in the home by [employer].  No difficulty with medicaitons or attending MD appointments. Continue to follow for discharge planning.

## 2015-07-18 NOTE — Consult Note (Signed)
ANTICOAGULATION CONSULT NOTE - Initial Consult  Pharmacy Consult for warfarin Indication: atrial fibrillation  Allergies  Allergen Reactions  . Penicillins Rash   Patient Measurements: Height:  (154.9 cm) Weight: 161 lb 6 oz (73.199 kg) IBW/kg (Calculated) : 47.8  Vital Signs: Temp: 96.8 F (36 C) (09/05 0747) Temp Source: Axillary (09/05 0747) BP: 147/73 mmHg (09/05 0747) Pulse Rate: 111 (09/05 0747)  Labs:  Recent Labs  07/17/15 1131 07/17/15 1142 07/18/15 0459  HGB 12.2  --  12.0  HCT 36.2  --  34.7*  PLT 274  --  287  LABPROT  --  33.1* 33.5*  INR  --  3.24 3.29  CREATININE 0.59  --  0.53   Estimated Creatinine Clearance: 59.9 mL/min (by C-G formula based on Cr of 0.53).  Medical History: Past Medical History  Diagnosis Date  . COPD (chronic obstructive pulmonary disease) Jan 2014  . A-fib   . Coronary artery disease     Medications:  Scheduled:  . budesonide-formoterol  2 puff Inhalation BID  . diltiazem  180 mg Oral Daily  . insulin aspart  0-5 Units Subcutaneous QHS  . insulin aspart  0-9 Units Subcutaneous TID WC  . levofloxacin (LEVAQUIN) IV  750 mg Intravenous Q24H  . lisinopril  40 mg Oral Daily  . methylPREDNISolone (SOLU-MEDROL) injection  60 mg Intravenous 3 times per day  . metoprolol  100 mg Oral BID  . pravastatin  80 mg Oral Daily  . sodium chloride  3 mL Intravenous Q12H  . tiotropium  18 mcg Inhalation Daily  . Warfarin - Pharmacist Dosing Inpatient   Does not apply q1800    Assessment: Pt is a 69 year old female with a hx of afib. She takes warfarin at home. She takes  qod alternating with 4.5mg .  9/3  Warfarin 4.5mg  given.   9/4  INR 3.25,  Warfarin 2.5mg  given.  9/5  INR 3.29  Goal of Therapy:  INR 2-3 Monitor platelets by anticoagulation protocol: Yes   Plan:  Patient is slightly supratherapeutic today. Because she is on antibiotics that could raise INR will give Warfain  tonight. Will recheck INR in  AM.  Stormy Card, RPh Clinical Pharmacist 07/18/2015,8:59 AM

## 2015-07-19 ENCOUNTER — Ambulatory Visit: Payer: Medicare Other | Admitting: Family Medicine

## 2015-07-19 ENCOUNTER — Inpatient Hospital Stay: Payer: Medicare Other

## 2015-07-19 LAB — OSMOLALITY: OSMOLALITY: 267 mosm/kg — AB (ref 275–300)

## 2015-07-19 LAB — BASIC METABOLIC PANEL
Anion gap: 6 (ref 5–15)
BUN: 26 mg/dL — AB (ref 6–20)
CHLORIDE: 86 mmol/L — AB (ref 101–111)
CO2: 29 mmol/L (ref 22–32)
CREATININE: 0.77 mg/dL (ref 0.44–1.00)
Calcium: 8.1 mg/dL — ABNORMAL LOW (ref 8.9–10.3)
GFR calc non Af Amer: >60 mL/min (ref >60)
GLUCOSE: 180 mg/dL — AB (ref 65–99)
Potassium: 5.4 mmol/L — ABNORMAL HIGH (ref 3.5–5.1)
Sodium: 121 mmol/L — ABNORMAL LOW (ref 135–145)

## 2015-07-19 LAB — GLUCOSE, CAPILLARY
GLUCOSE-CAPILLARY: 203 mg/dL — AB (ref 65–99)
GLUCOSE-CAPILLARY: 194 mg/dL — AB (ref 65–99)
Glucose-Capillary: 177 mg/dL — ABNORMAL HIGH (ref 65–99)
Glucose-Capillary: 243 mg/dL — ABNORMAL HIGH (ref 65–99)

## 2015-07-19 LAB — PROTIME-INR
INR: 3.51
PROTHROMBIN TIME: 35.2 s — AB (ref 11.4–15.0)

## 2015-07-19 LAB — SODIUM
SODIUM: 117 mmol/L — AB (ref 135–145)
Sodium: 118 mmol/L — CL (ref 135–145)

## 2015-07-19 LAB — OSMOLALITY, URINE: OSMOLALITY UR: 442 mosm/kg (ref 390–1090)

## 2015-07-19 LAB — SODIUM, URINE, RANDOM: Sodium, Ur: <10 mmol/L

## 2015-07-19 MED ORDER — SODIUM CHLORIDE 3 % IV SOLN
INTRAVENOUS | Status: DC
  Administered 2015-07-19: 30 mL/h via INTRAVENOUS
  Filled 2015-07-19 (×3): qty 500

## 2015-07-19 MED ORDER — TOLVAPTAN 15 MG PO TABS
15.0000 mg | ORAL_TABLET | ORAL | Status: DC
  Administered 2015-07-19: 15 mg via ORAL
  Filled 2015-07-19 (×2): qty 1

## 2015-07-19 NOTE — Consult Note (Signed)
[facility]  CONSULT NOTE    Date: 07/19/2015                  Patient Name:  [name]  MRN: [id number]  DOB: 05/20/1946  Age / Sex: 69 y.o., female         PCP: Fidel Levy, MD                 Service Requesting Consult: Dr. Sherryll Burger                 Reason for Consult: Hyponatremia            History of Present Illness: [name] is a 69 y.o. white  female with COPD, atrial fibrillation, coronary artery disease, left renal artery stenosis status post stent 2008, diabetes mellitus type II noninsulin dependent, hyperlipidemia, hypertension, who was admitted to [facility] on 07/17/2015 for Community acquired pneumonia [J18.9]  Nephrology consulted to hyponatremia. Patient was admitted with a serum sodium of 124. Furosemide was discontinued and then started on NS at 64mL/hr. Serum sodium then trended to 122 and then 121 today. Placed on fluid restriction Patient with peripheral edema. Patient states she drinks a lot of water at home.  Sister at bedside.     Medications: Outpatient medications: Prescriptions prior to admission  Medication Sig Dispense Refill Last Dose  . ACCU-CHEK AVIVA PLUS test strip 1 each by Other route daily.    07/17/2015 at Unknown time  . albuterol (PROVENTIL) (2.5 MG/3ML) 0.083% nebulizer solution Take 2.5 mg by nebulization every 6 (six) hours as needed for wheezing.   07/17/2015 at Unknown time  . budesonide-formoterol (SYMBICORT) 160-4.5 MCG/ACT inhaler Inhale 2 puffs into the lungs 2 (two) times daily.    07/17/2015 at Unknown time  . diltiazem (CARDIZEM CD) 180 MG 24 hr capsule TAKE ONE (1) CAPSULE EACH DAY  3 07/17/2015 at Unknown time  . furosemide (LASIX) 20 MG tablet Take 20 mg by mouth daily.    07/17/2015 at Unknown time  . lisinopril (PRINIVIL,ZESTRIL) 40 MG tablet Take 40 mg by mouth daily.    07/17/2015 at Unknown time  . metFORMIN (GLUCOPHAGE) 500 MG tablet Take 500 mg by mouth daily.    07/17/2015 at Unknown time  . metoprolol  (LOPRESSOR) 100 MG tablet Take 1 tablet (100 mg total) by mouth 2 (two) times daily. 180 tablet 3 07/17/2015 at 0600  . OXYGEN-HELIUM IN Inhale 2 L into the lungs continuous. 2 liters of oxygen.   07/17/2015 at Unknown time  . potassium chloride SA (K-DUR,KLOR-CON) 20 MEQ tablet Take 20 mEq by mouth 2 (two) times daily.    07/17/2015 at Unknown time  . pravastatin (PRAVACHOL) 80 MG tablet Take 80 mg by mouth daily.    07/16/2015 at Unknown time  . predniSONE (DELTASONE) 5 MG tablet Take 5 mg by mouth daily.    07/17/2015 at Unknown time  . tiotropium (SPIRIVA) 18 MCG inhalation capsule Place 18 mcg into inhaler and inhale daily.    07/17/2015 at Unknown time  . warfarin (COUMADIN) 3 MG tablet Take 1 tablet (3 mg total) by mouth daily. Every other day take 1.5 tabs by mouth of the 3 mg tabs and then 1tab of 3mg  on the other days. Same dose as before. 45 tablet 3 07/16/2015 at Unknown time    Current medications: Current Facility-Administered Medications  Medication Dose Route Frequency Provider Last Rate Last Dose  . acetaminophen (TYLENOL) tablet 650 mg  650 mg Oral Q6H PRN Adrian Saran, MD       Or  . acetaminophen (TYLENOL) suppository 650 mg  650 mg Rectal Q6H PRN Sital Mody, MD      . albuterol (PROVENTIL) (2.5 MG/3ML) 0.083% nebulizer solution 2.5 mg  2.5 mg Nebulization Q6H PRN Adrian Saran, MD   2.5 mg at 07/18/15 1249  . alum & mag hydroxide-simeth (MAALOX/MYLANTA) 200-200-20 MG/5ML suspension 30 mL  30 mL Oral Q6H PRN Adrian Saran, MD   30 mL at 07/19/15 1104  . budesonide-formoterol (SYMBICORT) 160-4.5 MCG/ACT inhaler 2 puff  2 puff Inhalation BID Adrian Saran, MD   2 puff at 07/19/15 1055  . diltiazem (CARDIZEM CD) 24 hr capsule 180 mg  180 mg Oral Daily Sital Mody, MD   180 mg at 07/19/15 1056  . insulin aspart (novoLOG) injection 0-5 Units  0-5 Units Subcutaneous QHS Adrian Saran, MD   0 Units at 07/17/15 2200  . insulin aspart (novoLOG) injection 0-9 Units  0-9 Units Subcutaneous TID WC Adrian Saran, MD   3  Units at 07/19/15 1218  . levofloxacin (LEVAQUIN) IVPB 750 mg  750 mg Intravenous Q24H Jennye Moccasin, MD 100 mL/hr at 07/19/15 1219 750 mg at 07/19/15 1219  . lisinopril (PRINIVIL,ZESTRIL) tablet 40 mg  40 mg Oral Daily Adrian Saran, MD   40 mg at 07/19/15 1056  . methylPREDNISolone sodium succinate (SOLU-MEDROL) 125 mg/2 mL injection 60 mg  60 mg Intravenous 3 times per day Adrian Saran, MD   60 mg at 07/19/15 0634  . metoprolol (LOPRESSOR) tablet 100 mg  100 mg Oral BID Adrian Saran, MD   100 mg at 07/19/15 1056  . ondansetron (ZOFRAN) tablet 4 mg  4 mg Oral Q6H PRN Adrian Saran, MD       Or  . ondansetron (ZOFRAN) injection 4 mg  4 mg Intravenous Q6H PRN Sital Mody, MD      . pravastatin (PRAVACHOL) tablet 80 mg  80 mg Oral Daily Sital Mody, MD   80 mg at 07/19/15 1055  . senna-docusate (Senokot-S) tablet 1 tablet  1 tablet Oral QHS PRN Sital Mody, MD      . sodium chloride 0.9 % injection 3 mL  3 mL Intravenous Q12H Sital Mody, MD   3 mL at 07/19/15 1056  . tiotropium (SPIRIVA) inhalation capsule 18 mcg  18 mcg Inhalation Daily Adrian Saran, MD   18 mcg at 07/19/15 1055  . tolvaptan (SAMSCA) tablet 15 mg  15 mg Oral Q24H Lamont Dowdy, MD      . Warfarin - Pharmacist Dosing Inpatient   Does not apply q1800 Melissa D Maccia, RPH      . zolpidem (AMBIEN) tablet 5 mg  5 mg Oral QHS PRN Marguarite Arbour, MD   5 mg at 07/18/15 2245      Allergies: Allergies  Allergen Reactions  . Penicillins Rash      Past Medical History: Past Medical History  Diagnosis Date  . COPD (chronic obstructive pulmonary disease) Jan 2014  . A-fib   . Coronary artery disease      Past Surgical History: Past Surgical History  Procedure Laterality Date  . Spine surgery  1999  . Joint replacement  1994, 1996, 2000    hip  . Coronary stent placement  2007  . Hip surgery    . Back surgery       Family History: Family History  Problem Relation Age of Onset  . Heart failure Father   .  Stroke Mother   .  Cancer Sister 26    breast     Social History: Social History   Social History  . Marital Status: Divorced    Spouse Name: N/A  . Number of Children: N/A  . Years of Education: N/A   Occupational History  . Not on file.   Social History Main Topics  . Smoking status: Former Smoker -- 2.00 packs/day for 40 years    Types: Cigarettes  . Smokeless tobacco: Never Used  . Alcohol Use: No  . Drug Use: No  . Sexual Activity: Not on file   Other Topics Concern  . Not on file   Social History Narrative     Review of Systems: Review of Systems  Constitutional: Positive for fever, chills, malaise/fatigue and diaphoresis. Negative for weight loss.  HENT: Negative.  Negative for congestion, ear discharge, ear pain, hearing loss, nosebleeds, sore throat and tinnitus.   Eyes: Negative.  Negative for blurred vision, double vision, photophobia, pain, discharge and redness.  Respiratory: Positive for cough, sputum production, shortness of breath and wheezing. Negative for hemoptysis and stridor.   Cardiovascular: Positive for leg swelling. Negative for chest pain, palpitations, orthopnea, claudication and PND.  Gastrointestinal: Negative.  Negative for heartburn, nausea, vomiting, abdominal pain, diarrhea, constipation, blood in stool and melena.  Genitourinary: Negative.  Negative for dysuria, urgency, frequency, hematuria and flank pain.  Musculoskeletal: Negative.  Negative for myalgias, back pain, joint pain, falls and neck pain.  Skin: Negative for itching and rash.  Neurological: Negative.  Negative for dizziness, tingling, tremors, sensory change, speech change, focal weakness, seizures, loss of consciousness, weakness and headaches.  Endo/Heme/Allergies: Negative.  Negative for environmental allergies and polydipsia. Does not bruise/bleed easily.  Psychiatric/Behavioral: Negative for depression, suicidal ideas, hallucinations, memory loss and substance abuse. The patient is not  nervous/anxious and does not have insomnia.     Vital Signs: Blood pressure 133/76, pulse 83, temperature 98.1 F (36.7 C), temperature source Oral, resp. rate 20, height  (1.549 m), weight 73.199 kg (161 lb 6 oz), SpO2 92 %.  Weight trends: Filed Weights   07/17/15 1020  Weight: 73.199 kg (161 lb 6 oz)    Physical Exam: General: NAD  Head: Normocephalic, atraumatic. Moist oral mucosal membranes  Eyes: Anicteric, PERRL  Neck: Supple, trachea midline  Lungs:  Left sided rhonchi, bilateral rales  Heart: Regular rate and rhythm  Abdomen:  Soft, nontender,   Extremities: 1+ peripheral edema.  Neurologic: Nonfocal, moving all four extremities  Skin: No lesions        Lab results: Basic Metabolic Panel:  Recent Labs Lab 07/17/15 1131 07/18/15 0459 07/19/15 0432  NA 124* 122* 121*  K 4.7 4.9 5.4*  CL 86* 89* 86*  CO2 GLUCOSE 139* 153* 180*  BUN 15 20 26*  CREATININE 0.59 0.53 0.77  CALCIUM 8.4* 8.2* 8.1*    Liver Function Tests: No results for input(s): AST, ALT, ALKPHOS, BILITOT, PROT, ALBUMIN in the last 168 hours. No results for input(s): LIPASE, AMYLASE in the last 168 hours. No results for input(s): AMMONIA in the last 168 hours.  CBC:  Recent Labs Lab 07/17/15 1131 07/18/15 0459  WBC 14.2* 11.9*  HGB 12.2 12.0  HCT 36.2 34.7*  MCV 92.2 92.7  PLT 274 287    Cardiac Enzymes: No results for input(s): CKTOTAL, CKMB, CKMBINDEX, TROPONINI in the last 168 hours.  BNP: Invalid input(s): POCBNP  CBG:  Recent Labs Lab 07/18/15 1132  07/18/15 1635 07/18/15 2133 07/19/15 0731 07/19/15 1129  GLUCAP 274* 148* 175* 177* 243*    Microbiology: Results for orders placed or performed during the hospital encounter of 07/17/15  Blood culture (routine x 2)     Status: None (Preliminary result)   Collection Time: 07/17/15 12:02 PM  Result Value Ref Range Status   Specimen Description BLOOD LEFT FOREARM  Final   Special Requests BOTTLES  DRAWN AEROBIC AND ANAEROBIC  3CC  Final   Culture NO GROWTH 2 DAYS  Final   Report Status PENDING  Incomplete  Blood culture (routine x 2)     Status: None (Preliminary result)   Collection Time: 07/17/15 12:07 PM  Result Value Ref Range Status   Specimen Description BLOOD RIGHT ASSIST CONTROL  Final   Special Requests BOTTLES DRAWN AEROBIC AND ANAEROBIC  4CC  Final   Culture NO GROWTH 2 DAYS  Final   Report Status PENDING  Incomplete    Coagulation Studies:  Recent Labs  07/17/15 1142 07/18/15 0459 07/19/15 0432  LABPROT 33.1* 33.5* 35.2*  INR 3.24 3.29 3.51    Urinalysis: No results for input(s): COLORURINE, LABSPEC, PHURINE, GLUCOSEU, HGBUR, BILIRUBINUR, KETONESUR, PROTEINUR, UROBILINOGEN, NITRITE, LEUKOCYTESUR in the last 72 hours.  Invalid input(s): APPERANCEUR    Imaging:  No results found.   Assessment & Plan: [name] is a 69 y.o. white  female with COPD, atrial fibrillation, coronary artery disease, left renal artery stenosis status post stent 2008, diabetes mellitus type II noninsulin dependent, hyperlipidemia, hypertension, who was admitted to [facility] on 07/17/2015 for Community acquired pneumonia [J18.9]   1. Hyponatremia: acute. 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 No echo available.  - agree with fluid restriction - one dose of samsca 15mg   - q6 hour sodium levels - check serum and urine osm.   2. Hyperkalemia: potassium 5.4, will monitor   3. Pneumonia with acute COPD exacerbation: levofloxacin. Afebrile. Continue supportive care, oxygen and nebs.  CT chest pending.   4. Hypertension: well controlled.  - lisinopril, diltiazem and metoprolol.     LOS: 2 Lyam Provencio 9/6/20161:36 PM

## 2015-07-19 NOTE — Consult Note (Signed)
ANTICOAGULATION CONSULT NOTE - Initial Consult  Pharmacy Consult for warfarin Indication: atrial fibrillation  Allergies  Allergen Reactions  . Penicillins Rash   Patient Measurements: Height:  (154.9 cm) Weight: 161 lb 6 oz (73.199 kg) IBW/kg (Calculated) : 47.8  Vital Signs: Temp: 98.1 F (36.7 C) (09/05 2015) Temp Source: Oral (09/05 2015) BP: 156/84 mmHg (09/05 2237) Pulse Rate: 119 (09/05 2237)  Labs:  Recent Labs  07/17/15 1131 07/17/15 1142 07/18/15 0459 07/19/15 0432  HGB 12.2  --  12.0  --   HCT 36.2  --  34.7*  --   PLT 274  --  287  --   LABPROT  --  33.1* 33.5* 35.2*  INR  --  3.24 3.29 3.51  CREATININE 0.59  --  0.53  --    Estimated Creatinine Clearance: 59.9 mL/min (by C-G formula based on Cr of 0.53).  Medical History: Past Medical History  Diagnosis Date  . COPD (chronic obstructive pulmonary disease) Jan 2014  . A-fib   . Coronary artery disease     Medications:  Scheduled:  . budesonide-formoterol  2 puff Inhalation BID  . diltiazem  180 mg Oral Daily  . insulin aspart  0-5 Units Subcutaneous QHS  . insulin aspart  0-9 Units Subcutaneous TID WC  . levofloxacin (LEVAQUIN) IV  750 mg Intravenous Q24H  . lisinopril  40 mg Oral Daily  . methylPREDNISolone (SOLU-MEDROL) injection  60 mg Intravenous 3 times per day  . metoprolol  100 mg Oral BID  . pravastatin  80 mg Oral Daily  . sodium chloride  3 mL Intravenous Q12H  . tiotropium  18 mcg Inhalation Daily  . Warfarin - Pharmacist Dosing Inpatient   Does not apply q1800    Assessment: Pt is a 69 year old female with a hx of afib. She takes warfarin at home. She takes  qod alternating with 4.5mg .  9/3  Warfarin 4.5mg  given.   9/4  INR 3.25,  Warfarin 2.5mg  given.  9/5  INR 3.29 9/6 INR 3.51. No dose ordered for today.  Goal of Therapy:  INR 2-3 Monitor platelets by anticoagulation protocol: Yes   Plan:   Patient is supratherapeutic today.We will hold today's dose and  recheck INR in AM.  Durinda Buzzelli S, RPh Clinical Pharmacist 07/19/2015,6:24 AM

## 2015-07-19 NOTE — Progress Notes (Signed)
Date: 07/19/2015,   MRN# [id number] [name] 01/20/1946 Code Status:     Code Status Orders        Start     Ordered   07/17/15 1650  Full code   Continuous     07/17/15 1649     Hosp day:@LENGTHOFSTAYDAYS @ Referring MD: @         AdmissionWeight: 161 lb 6 oz (73.199 kg)                 CurrentWeight: 161 lb 6 oz (73.199 kg)  CC: pneumonia and respiratory difficulty   HPI: This is a 69 year old lady, well known to my service. She came in with increase sob and wheezing. No frank chest pain, leg edema or calf pain. She was admitted with pneumonia, cap, copd exacerbation. Pulmonary asked to evaluate. She is on levaquin, nebs, steroids and 6 liters of oxygen.       PMHX:   Past Medical History  Diagnosis Date  . COPD (chronic obstructive pulmonary disease) Jan 2014  . A-fib   . Coronary artery disease    Surgical Hx:  Past Surgical History  Procedure Laterality Date  . Spine surgery  1999  . Joint replacement  1994, 1996, 2000    hip  . Coronary stent placement  2007  . Hip surgery    . Back surgery     Family Hx:  Family History  Problem Relation Age of Onset  . Heart failure Father   . Stroke Mother   . Cancer Sister 109    breast   Social Hx:   Social History  Substance Use Topics  . Smoking status: Former Smoker -- 2.00 packs/day for 40 years    Types: Cigarettes  . Smokeless tobacco: Never Used  . Alcohol Use: No   Medication:    Home Medication:  No current outpatient prescriptions on file.  Current Medication: @   Allergies:  Penicillins  Review of Systems: Gen:  Denies  fever, sweats, chills HEENT: Denies blurred vision, double vision, ear pain, eye pain, hearing loss, nose bleeds, sore throat Cvc:  No dizziness, chest pain or heaviness Resp:  Sob, better, occasional wheezes, mild coug, no hemoptysis  Gi: Denies swallowing difficulty, stomach pain, nausea or vomiting, diarrhea, constipation, bowel incontinence Gu:  Denies  bladder incontinence, burning urine Ext:   No Joint pain, stiffness or swelling Skin: No skin rash, easy bruising or bleeding or hives Endoc:  No polyuria, polydipsia , polyphagia or weight change Psych: No depression, insomnia or hallucinations  Other:  All other systems negative  Physical Examination:   VS: BP 154/86 mmHg  Pulse 75  Temp(Src) 97.6 F (36.4 C) (Oral)  Resp 18  Ht  (1.549 m)  Wt 161 lb 6 oz (73.199 kg)  BMI 30.51 kg/m2  SpO2 95%  General Appearance: No distress  Neuro/psych without focal findings, mental status, speech normal, alert and oriented, cranial nerves 2-12 intact, reflexes normal and symmetric, sensation grossly normal  HEENT: PERRLA, EOM intact, no ptosis, no other lesions noticed Pulmonary:.Rare wheezing, mild rhonchi, No rales     Cardiovascular:  Normal S1,S2.  No m/r/g.      Abdomen:Benign, Soft, non-tender, No masses, hepatosplenomegaly, No lymphadenopathy Endoc: No evident thyromegaly, no signs of acromegaly or Cushing features Skin:   warm, no rashes, no ecchymosis  Extremities: normal, no cyanosis, clubbing, no edema, warm with normal capillary refill. Other findings:   Labs results:   Recent Labs  07/17/15  1131  07/17/15  1142  07/18/15  0459  07/19/15  0432  HGB  12.2   --   12.0   --   HCT  36.2   --   34.7*   --   MCV  92.2   --   92.7   --   WBC  14.2*   --   11.9*   --   BUN  15   --   20  26*  CREATININE  0.59   --   0.53  0.77  GLUCOSE  139*   --   153*  180*  CALCIUM  8.4*   --   8.2*  8.1*  INR   --   3.24  3.29  3.51  ,       Rad results:   Ct Chest Wo Contrast  07/19/2015   CLINICAL DATA:  69 year old with acute superimposed upon chronic hypoxic respiratory failure. Followup community acquired left upper lobe pneumonia. Current history of COPD and coronary artery disease post stent placement.  EXAM: CT CHEST WITHOUT CONTRAST  TECHNIQUE: Multidetector CT imaging of the chest was performed following the  standard protocol without IV contrast.  COMPARISON:  CT chest 09/07/2009. Multiple intervening chest x-rays.  FINDINGS: Lungs: Confluent airspace consolidation with air bronchograms involving the left upper lobe. Patchy airspace opacities in the lower lobes. Mosaic attenuation involving both lungs. Emphysematous changes throughout both lungs, predominantly in the upper lobes, asymmetric and with greater involvement of the right lung as noted previously.  Trachea/bronchi: Bronchiectasis involving the lower lobes. Mucous filled bronchi in the lower lobes, right greater than left. Marked central bronchial wall thickening.  Pleura: Small bilateral pleural effusions. No visible pleural masses. Biapical pleuroparenchymal scarring, unchanged.  Mediastinum:  No mediastinal masses.  Heart and Vascular: Heart markedly enlarged. Severe 3 vessel coronary atherosclerosis. Extensive mitral annular calcification. No pericardial effusion. Severe atherosclerosis involving the thoracic and upper abdominal aorta and their visualized branches. Ascending thoracic aortic ectasia, maximum axial dimensions 4.4 x 4.2 cm (previously 4.0 x 3.8 cm).  Lymphatic: Mildly enlarged bilateral hilar and mediastinal lymph nodes. Right hilar lymph nodes measure approximately 2.3 x 1.9 cm. Index AP window (mediastinal station 5) lymph node measures 2.7 x 2.0 cm, and multiple adjacent enlarged station 5 nodes are present. Index subcarinal (mediastinal station 7) lymph nodes measure approximately 2.3 x 2.8 cm.  Other findings: None.  Visualized lower neck: Visualized thyroid gland normal in size and appearance without nodularity. No supraclavicular lymphadenopathy.  Visualized upper abdomen: Normal in appearance for the unenhanced technique with the exception of extensive aortic and visceral artery atherosclerosis.  Musculoskeletal: Mild osseous demineralization. Degenerative disc disease involving the visualized lower cervical spine. Minimal degenerative  disc disease involving the upper and mid thoracic spine.  IMPRESSION: 1. Pneumonia involving the left upper lobe and to a lesser degree the lower lobes bilaterally. 2. Bronchiectasis involving both lower lobes, progressive since 2010, with mucus plugging of the dilated bronchi in the lower lobes, right greater than left. 3. Small bilateral pleural effusions. 4. Bilateral hilar and mediastinal lymphadenopathy, likely reactive. 5. COPD/emphysema. Marked central bronchial wall thickening consistent with bronchitis, likely acute superimposed upon chronic. 6. Cardiomegaly with severe 3 vessel coronary atherosclerosis. 7. Ectasia of the ascending thoracic aorta, maximum diameter 4.4 cm. Recommend annual imaging followup by CTA or MRA. This recommendation follows 2010 ACCF/AHA/AATS/ACR/ASA/SCA/SCAI/SIR/STS/SVM Guidelines for the Diagnosis and Management of Patients with Thoracic Aortic Disease. Circulation. 2010; 121: Z610-R604. Imaging findings of potential clinical  significance (RAF scoring):  Aortic Atherosclerosis (ICD10-170.0)  Emphysema. (ZOX09-U04.9)   Electronically Signed   By: Hulan Saas M.D.   On: 07/19/2015 13:45      Assessment and Plan: 1 Bilateral pneumonia cap levaquin If possible sputum c/s  2 Bronchiectasis, progressive ? MAI  As per above Flutter valve  3 Late stage II copd  Resume pre admit regimen Solumedrol 0xygen Wean fio2 as tolerated  4 Bilateral hilar and mediastinal adenopathy, ??? 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  The Patient requires high complexity decision making for assessment and support, frequent evaluation and titration of therapies, application of advanced monitoring technologies and extensive  interpretation of multiple databases.   Tienna Bienkowski,M.D. Pulmonary & Critical care Medicine [facility]

## 2015-07-19 NOTE — Progress Notes (Addendum)
[facility] - [place] [facility]   PATIENT NAME: [name]    MR#:  [id number]  DATE OF BIRTH:  08-03-1946  SUBJECTIVE:  CHIEF COMPLAINT:   Chief Complaint  Patient presents with  . URI  looks quite short of breath, using accessory muscles of respiration REVIEW OF SYSTEMS:  Review of Systems  Constitutional: Negative for fever, weight loss, malaise/fatigue and diaphoresis.  HENT: Negative for ear discharge, ear pain, hearing loss, nosebleeds, sore throat and tinnitus.   Eyes: Negative for blurred vision and pain.  Respiratory: Positive for cough, shortness of breath and wheezing. Negative for hemoptysis.   Cardiovascular: Negative for chest pain, palpitations, orthopnea and leg swelling.  Gastrointestinal: Negative for heartburn, nausea, vomiting, abdominal pain, diarrhea, constipation and blood in stool.  Genitourinary: Negative for dysuria, urgency and frequency.  Musculoskeletal: Negative for myalgias and back pain.  Skin: Negative for itching and rash.  Neurological: Negative for dizziness, tingling, tremors, focal weakness, seizures, weakness and headaches.  Psychiatric/Behavioral: Negative for depression. The patient is not nervous/anxious.    DRUG ALLERGIES:   Allergies  Allergen Reactions  . Penicillins Rash   VITALS:  Blood pressure 133/76, pulse 83, temperature 98.1 F (36.7 C), temperature source Oral, resp. rate 20, height 5\' 1"  (1.549 m), weight 73.199 kg (161 lb 6 oz), SpO2 92 %. PHYSICAL EXAMINATION:  Physical Exam  Constitutional: She is oriented to person, place, and time and well-developed, well-nourished, and in no distress.  HENT:  Head: Normocephalic and atraumatic.  Eyes: Conjunctivae and EOM are normal. Pupils are equal, round, and reactive to light.  Neck: Normal range of motion. Neck supple. No tracheal deviation present. No thyromegaly present.  Cardiovascular: Normal rate, regular rhythm and normal heart sounds.    Pulmonary/Chest: She is in respiratory distress. She has wheezes. She exhibits no tenderness.  Abdominal: Soft. Bowel sounds are normal. She exhibits no distension. There is no tenderness.  Musculoskeletal: Normal range of motion.  Neurological: She is alert and oriented to person, place, and time. No cranial nerve deficit.  Skin: Skin is warm and dry. No rash noted.  Psychiatric: Mood and affect normal.   LABORATORY PANEL:   CBC  Recent Labs Lab 07/18/15 0459  WBC 11.9*  HGB 12.0  HCT 34.7*  PLT 287   ------------------------------------------------------------------------------------------------------------------ Chemistries   Recent Labs Lab 07/19/15 0432  NA 121*  K 5.4*  CL 86*  CO2 29  GLUCOSE 180*  BUN 26*  CREATININE 0.77  CALCIUM 8.1*   RADIOLOGY:  No results found. ASSESSMENT AND PLAN:  69 year old female with chronic respiratory failure on 2 L oxygen who presents with shortness of breath and found to have pneumonia.  1. Sepsis: Present on admission likely due to community-acquired pneumonia. Plan as outlined below.  2. Community-acquired pneumonia: continue Levaquin. Blood cultures negative.  Continue IV fluids as well.  3. Acute on chronic hypoxic respiratory failure: This is primarily due to community-acquired pneumonia.  Continue steroids.  Still not looking much better.  Will get pulmonary consultation.  Obtain CT scan of the chest for further evaluation  4. Hyponatremia/Hyperkalemia: Na Dropping 124->122->121, stop the fluids and do fluid restriction. C/s nephro. K 5.4. Monitor.   5. Atrial fibrillation: Heart rate is better controlled. Continue outpatient medications including metoprolol and diltiazem along with Coumadin. INR is 3.51  6. Diabetes:  ADA diet and send scale insulin. Metformin is on hold for now.  7. Essential hypertension: Continue metoprolol, diltiazem and lisinopril.    All  the records are reviewed and case discussed with Care  Management/Social Worker. Management plans discussed with the patient, family and they are in agreement.  CODE STATUS: Full code  TOTAL TIME TAKING CARE OF THIS PATIENT: 35 minutes.   More than 50% of the time was spent in counseling/coordination of care: YES  POSSIBLE D/C IN 1-2 DAYS, DEPENDING ON CLINICAL CONDITION and pulmonary evaluation   [facility], Necia Kamm M.D on 07/19/2015 at 11:23 AM  Between 7am to 6pm - Pager - [phone]  After 6pm go to [email or web address] - password [email or web address] [facility]  [place]  Office  [phone]  CC:  Primary care physician; Fidel Levy, MD

## 2015-07-19 NOTE — Progress Notes (Signed)
Aked to see the patient for Pneumonia. Patient has a Left lobar pneumonia noted on the CT scan. She is a patient of Dr Verdie Shire. Will have office called to notify him of the consult  Freda Munro, MD

## 2015-07-19 NOTE — Progress Notes (Signed)
Dr. Wynelle Link notified of critical NA value 117.

## 2015-07-20 LAB — BASIC METABOLIC PANEL
ANION GAP: 5 (ref 5–15)
BUN: 29 mg/dL — AB (ref 6–20)
CALCIUM: 8.6 mg/dL — AB (ref 8.9–10.3)
CO2: 31 mmol/L (ref 22–32)
Chloride: 97 mmol/L — ABNORMAL LOW (ref 101–111)
Creatinine, Ser: 0.74 mg/dL (ref 0.44–1.00)
GFR calc Af Amer: >60 mL/min (ref >60)
GLUCOSE: 199 mg/dL — AB (ref 65–99)
POTASSIUM: 5.8 mmol/L — AB (ref 3.5–5.1)
Sodium: 133 mmol/L — ABNORMAL LOW (ref 135–145)

## 2015-07-20 LAB — GLUCOSE, CAPILLARY
GLUCOSE-CAPILLARY: 189 mg/dL — AB (ref 65–99)
GLUCOSE-CAPILLARY: 212 mg/dL — AB (ref 65–99)
GLUCOSE-CAPILLARY: 168 mg/dL — AB (ref 65–99)
Glucose-Capillary: 283 mg/dL — ABNORMAL HIGH (ref 65–99)

## 2015-07-20 LAB — CBC
HEMATOCRIT: 34.1 % — AB (ref 35.0–47.0)
HEMOGLOBIN: 11.6 g/dL — AB (ref 12.0–16.0)
MCH: 32 pg (ref 26.0–34.0)
MCHC: 34 g/dL (ref 32.0–36.0)
MCV: 94.3 fL (ref 80.0–100.0)
Platelets: 278 10*3/uL (ref 150–440)
RBC: 3.61 MIL/uL — ABNORMAL LOW (ref 3.80–5.20)
RDW: 14.3 % (ref 11.5–14.5)
WBC: 20.9 10*3/uL — ABNORMAL HIGH (ref 3.6–11.0)

## 2015-07-20 LAB — PROTIME-INR
INR: 2.91
PROTHROMBIN TIME: 30.5 s — AB (ref 11.4–15.0)

## 2015-07-20 LAB — SODIUM
SODIUM: 127 mmol/L — AB (ref 135–145)
Sodium: 133 mmol/L — ABNORMAL LOW (ref 135–145)
Sodium: 133 mmol/L — ABNORMAL LOW (ref 135–145)

## 2015-07-20 MED ORDER — WARFARIN SODIUM 4 MG PO TABS
2.0000 mg | ORAL_TABLET | Freq: Every day | ORAL | Status: DC
  Administered 2015-07-20: 2 mg via ORAL
  Filled 2015-07-20: qty 1

## 2015-07-20 MED ORDER — FUROSEMIDE 10 MG/ML IJ SOLN
40.0000 mg | Freq: Once | INTRAMUSCULAR | Status: AC
  Administered 2015-07-20: 40 mg via INTRAVENOUS
  Filled 2015-07-20: qty 4

## 2015-07-20 NOTE — Progress Notes (Signed)
[facility]  ROUNDING NOTE   Subjective:   Sodium improved to 133 this morning. Hypertonic sodium ordered last night and was running for 12 hours Given tolvaptan 15mg  yesterday.   Objective:  Vital signs in last 24 hours:  Temp:  [97.6 F (36.4 C)-97.8 F (36.6 C)] 97.6 F (36.4 C) (09/07 0834) Pulse Rate:  [72-80] 80 (09/07 0834) Resp:  [18-20] 20 (09/07 0834) BP: (148-163)/(74-86) 148/84 mmHg (09/07 0834) SpO2:  [95 %-98 %] 98 % (09/07 0834)  Weight change:  Filed Weights   07/17/15 1020  Weight: 73.199 kg (161 lb 6 oz)    Intake/Output: I/O last 3 completed shifts: In: 2303.3 [P.O.:740; I.V.:1563.3] Out: 2475 [Urine:2475]   Intake/Output this shift:  Total I/O In: 282 [P.O.:240; I.V.:42] Out: 250 [Urine:250]  Physical Exam: General: NAD  Head: Normocephalic, atraumatic. Moist oral mucosal membranes  Eyes: Anicteric, PERRL  Neck: Supple, trachea midline  Lungs:  Left sided rhonchi  Heart: Regular rate and rhythm  Abdomen:  Soft, nontender,   Extremities: no peripheral edema.  Neurologic: Nonfocal, moving all four extremities  Skin: No lesions       Basic Metabolic Panel:  Recent Labs Lab 07/17/15 1131 07/18/15 0459 07/19/15 0432 07/19/15 1348 07/19/15 1943 07/20/15 0123 07/20/15 0520  NA 124* 122* 121* 117* 118* 127* 133*  K 4.7 4.9 5.4*  --   --   --  5.8*  CL 86* 89* 86*  --   --   --  97*  CO2 29 24 29   --   --   --  31  GLUCOSE 139* 153* 180*  --   --   --  199*  BUN 15 20 26*  --   --   --  29*  CREATININE 0.59 0.53 0.77  --   --   --  0.74  CALCIUM 8.4* 8.2* 8.1*  --   --   --  8.6*    Liver Function Tests: No results for input(s): AST, ALT, ALKPHOS, BILITOT, PROT, ALBUMIN in the last 168 hours. No results for input(s): LIPASE, AMYLASE in the last 168 hours. No results for input(s): AMMONIA in the last 168 hours.  CBC:  Recent Labs Lab 07/17/15 1131 07/18/15 0459 07/20/15 0520  WBC 14.2* 11.9* 20.9*  HGB 12.2  12.0 11.6*  HCT 36.2 34.7* 34.1*  MCV 92.2 92.7 94.3  PLT 274 287 278    Cardiac Enzymes: No results for input(s): CKTOTAL, CKMB, CKMBINDEX, TROPONINI in the last 168 hours.  BNP: Invalid input(s): POCBNP  CBG:  Recent Labs Lab 07/19/15 0731 07/19/15 1129 07/19/15 1630 07/19/15 2140 07/20/15 0732  GLUCAP 177* 243* 203* 194* 189*    Microbiology: Results for orders placed or performed during the hospital encounter of 07/17/15  Blood culture (routine x 2)     Status: None (Preliminary result)   Collection Time: 07/17/15 12:02 PM  Result Value Ref Range Status   Specimen Description BLOOD LEFT FOREARM  Final   Special Requests BOTTLES DRAWN AEROBIC AND ANAEROBIC  3CC  Final   Culture NO GROWTH 3 DAYS  Final   Report Status PENDING  Incomplete  Blood culture (routine x 2)     Status: None (Preliminary result)   Collection Time: 07/17/15 12:07 PM  Result Value Ref Range Status   Specimen Description BLOOD RIGHT ASSIST CONTROL  Final   Special Requests BOTTLES DRAWN AEROBIC AND ANAEROBIC  4CC  Final   Culture NO GROWTH 3 DAYS  Final  Report Status PENDING  Incomplete    Coagulation Studies:  Recent Labs  07/17/15 1142 07/18/15 0459 07/19/15 0432 07/20/15 0520  LABPROT 33.1* 33.5* 35.2* 30.5*  INR 3.24 3.29 3.51 2.91    Urinalysis: No results for input(s): COLORURINE, LABSPEC, PHURINE, GLUCOSEU, HGBUR, BILIRUBINUR, KETONESUR, PROTEINUR, UROBILINOGEN, NITRITE, LEUKOCYTESUR in the last 72 hours.  Invalid input(s): APPERANCEUR    Imaging: Ct Chest Wo Contrast  07/19/2015   CLINICAL DATA:  69 year old with acute superimposed upon chronic hypoxic respiratory failure. Followup community acquired left upper lobe pneumonia. Current history of COPD and coronary artery disease post stent placement.  EXAM: CT CHEST WITHOUT CONTRAST  TECHNIQUE: Multidetector CT imaging of the chest was performed following the standard protocol without IV contrast.  COMPARISON:  CT chest  09/07/2009. Multiple intervening chest x-rays.  FINDINGS: Lungs: Confluent airspace consolidation with air bronchograms involving the left upper lobe. Patchy airspace opacities in the lower lobes. Mosaic attenuation involving both lungs. Emphysematous changes throughout both lungs, predominantly in the upper lobes, asymmetric and with greater involvement of the right lung as noted previously.  Trachea/bronchi: Bronchiectasis involving the lower lobes. Mucous filled bronchi in the lower lobes, right greater than left. Marked central bronchial wall thickening.  Pleura: Small bilateral pleural effusions. No visible pleural masses. Biapical pleuroparenchymal scarring, unchanged.  Mediastinum:  No mediastinal masses.  Heart and Vascular: Heart markedly enlarged. Severe 3 vessel coronary atherosclerosis. Extensive mitral annular calcification. No pericardial effusion. Severe atherosclerosis involving the thoracic and upper abdominal aorta and their visualized branches. Ascending thoracic aortic ectasia, maximum axial dimensions 4.4 x 4.2 cm (previously 4.0 x 3.8 cm).  Lymphatic: Mildly enlarged bilateral hilar and mediastinal lymph nodes. Right hilar lymph nodes measure approximately 2.3 x 1.9 cm. Index AP window (mediastinal station 5) lymph node measures 2.7 x 2.0 cm, and multiple adjacent enlarged station 5 nodes are present. Index subcarinal (mediastinal station 7) lymph nodes measure approximately 2.3 x 2.8 cm.  Other findings: None.  Visualized lower neck: Visualized thyroid gland normal in size and appearance without nodularity. No supraclavicular lymphadenopathy.  Visualized upper abdomen: Normal in appearance for the unenhanced technique with the exception of extensive aortic and visceral artery atherosclerosis.  Musculoskeletal: Mild osseous demineralization. Degenerative disc disease involving the visualized lower cervical spine. Minimal degenerative disc disease involving the upper and mid thoracic spine.   IMPRESSION: 1. Pneumonia involving the left upper lobe and to a lesser degree the lower lobes bilaterally. 2. Bronchiectasis involving both lower lobes, progressive since 2010, with mucus plugging of the dilated bronchi in the lower lobes, right greater than left. 3. Small bilateral pleural effusions. 4. Bilateral hilar and mediastinal lymphadenopathy, likely reactive. 5. COPD/emphysema. Marked central bronchial wall thickening consistent with bronchitis, likely acute superimposed upon chronic. 6. Cardiomegaly with severe 3 vessel coronary atherosclerosis. 7. Ectasia of the ascending thoracic aorta, maximum diameter 4.4 cm. Recommend annual imaging followup by CTA or MRA. This recommendation follows 2010 ACCF/AHA/AATS/ACR/ASA/SCA/SCAI/SIR/STS/SVM Guidelines for the Diagnosis and Management of Patients with Thoracic Aortic Disease. Circulation. 2010; 121: Z610-R604. Imaging findings of potential clinical significance (RAF scoring):  Aortic Atherosclerosis (ICD10-170.0)  Emphysema. (VWU98-J19.9)   Electronically Signed   By: Hulan Saas M.D.   On: 07/19/2015 13:45     Medications:     . budesonide-formoterol  2 puff Inhalation BID  . diltiazem  180 mg Oral Daily  . insulin aspart  0-5 Units Subcutaneous QHS  . insulin aspart  0-9 Units Subcutaneous TID WC  . levofloxacin (LEVAQUIN) IV  750 mg  Intravenous Q24H  . lisinopril  40 mg Oral Daily  . methylPREDNISolone (SOLU-MEDROL) injection  60 mg Intravenous 3 times per day  . metoprolol  100 mg Oral BID  . pravastatin  80 mg Oral Daily  . sodium chloride  3 mL Intravenous Q12H  . tiotropium  18 mcg Inhalation Daily  . warfarin  2 mg Oral q1800  . Warfarin - Pharmacist Dosing Inpatient   Does not apply q1800   acetaminophen **OR** acetaminophen, albuterol, alum & mag hydroxide-simeth, ondansetron **OR** ondansetron (ZOFRAN) IV, senna-docusate, zolpidem  Assessment/ Plan:  [name] is a 69 y.o. white female with COPD, atrial  fibrillation, coronary artery disease, left renal artery stenosis status post stent 2008, diabetes mellitus type II noninsulin dependent, hyperlipidemia, hypertension, who was admitted to [facility] on 07/17/2015 for Community acquired pneumonia [J18.9]   1. Hyponatremia: acute. 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 No echo available.  - agree with fluid restriction - one dose of samsca 15mg  on 9/6 - hypertonic saline given overnight.  - q6 hour sodium levels  2. Hyperkalemia: potassium 5.8  - hold lisinopril   3. Pneumonia with acute COPD exacerbation: levofloxacin. Afebrile. Continue supportive care, oxygen and nebs. .   4. Hypertension: well controlled. hold lisinopril due to hyperkalemia - continue diltiazem and metoprolol.   LOS: 3 Niko Jakel 9/7/201610:28 AM

## 2015-07-20 NOTE — Progress Notes (Signed)
Date: 07/20/2015,   MRN# [id number] [name] 1946-10-21 Code Status:     Code Status Orders        Start     Ordered   07/17/15 1650  Full code   Continuous     07/17/15 1649     Hosp day:@LENGTHOFSTAYDAYS @ Referring MD: @ATDPROV @        AdmissionWeight: 161 lb 6 oz (73.199 kg)                 CurrentWeight: 161 lb 6 oz (73.199 kg)  HPI: In chair, family in room, comfortable, less sob at rest. Less wheezing.   PMHX:   Past Medical History  Diagnosis Date  . COPD (chronic obstructive pulmonary disease) Jan 2014  . A-fib   . Coronary artery disease    Surgical Hx:  Past Surgical History  Procedure Laterality Date  . Spine surgery  1999  . Joint replacement  1994, 1996, 2000    hip  . Coronary stent placement  2007  . Hip surgery    . Back surgery     Family Hx:  Family History  Problem Relation Age of Onset  . Heart failure Father   . Stroke Mother   . Cancer Sister 74    breast   Social Hx:   Social History  Substance Use Topics  . Smoking status: Former Smoker -- 2.00 packs/day for 40 years    Types: Cigarettes  . Smokeless tobacco: Never Used  . Alcohol Use: No   Medication:    Home Medication:  No current outpatient prescriptions on file.  Current Medication: @CURMEDTAB @   Allergies:  Penicillins  Review of Systems: Gen:  Denies  fever, sweats, chills HEENT: Denies blurred vision, double vision, ear pain, eye pain, hearing loss, nose bleeds, sore throat Cvc:  No dizziness, chest pain or heaviness Resp: less sob, more comfortable   Gi: Denies swallowing difficulty, stomach pain, nausea or vomiting, diarrhea, constipation, bowel incontinence Gu:  Denies bladder incontinence, burning urine Ext:   No Joint pain, stiffness or swelling Skin: No skin rash, easy bruising or bleeding or hives Endoc:  No polyuria, polydipsia , polyphagia or weight change Psych: No depression, insomnia or hallucinations  Other:  All other systems negative  Physical  Examination:   VS: BP 148/84 mmHg  Pulse 116  Temp(Src) 97.6 F (36.4 C) (Oral)  Resp 20  Ht 5\' 1"  (1.549 m)  Wt 161 lb 6 oz (73.199 kg)  BMI 30.51 kg/m2  SpO2 98%  General Appearance: No distress  Neuro: without focal findings, mental status, speech normal, alert and oriented, cranial nerves 2-12 intact, reflexes normal and symmetric, sensation grossly normal  HEENT: PERRLA, EOM intact, no ptosis, no other lesions noticed, Mallampati: Pulmonary:.less wheezing, No rales   Cardiovascular:  Normal S1,S2.  No m/r/g.   Abdomen:Benign, Soft, non-tender, No masses, hepatosplenomegaly, No lymphadenopathy Endoc: No evident thyromegaly, no signs of acromegaly or Cushing features Skin:   warm, no rashes, no ecchymosis  Extremities: normal, no cyanosis, clubbing, no edema, warm with normal capillary refill. Other findings:   Labs results:   Recent Labs     07/18/15  0459  07/19/15  0432  07/20/15  0520  HGB  12.0   --   11.6*  HCT  34.7*   --   34.1*  MCV  92.7   --   94.3  WBC  11.9*   --   20.9*  BUN  20  26*  29*  CREATININE  0.53  0.77  0.74  GLUCOSE  153*  180*  199*  CALCIUM  8.2*  8.1*  8.6*  INR  3.29  3.51  2.91  ,    Rad results:  Study Result     CLINICAL DATA: 69 year old with acute superimposed upon chronic hypoxic respiratory failure. Followup community acquired left upper lobe pneumonia. Current history of COPD and coronary artery disease post stent placement.  EXAM: CT CHEST WITHOUT CONTRAST  TECHNIQUE: Multidetector CT imaging of the chest was performed following the standard protocol without IV contrast.  COMPARISON: CT chest 09/07/2009. Multiple intervening chest x-rays.  FINDINGS: Lungs: Confluent airspace consolidation with air bronchograms involving the left upper lobe. Patchy airspace opacities in the lower lobes. Mosaic attenuation involving both lungs. Emphysematous changes throughout both lungs, predominantly in the upper  lobes, asymmetric and with greater involvement of the right lung as noted previously.  Trachea/bronchi: Bronchiectasis involving the lower lobes. Mucous filled bronchi in the lower lobes, right greater than left. Marked central bronchial wall thickening.  Pleura: Small bilateral pleural effusions. No visible pleural masses. Biapical pleuroparenchymal scarring, unchanged.  Mediastinum: No mediastinal masses.  Heart and Vascular: Heart markedly enlarged. Severe 3 vessel coronary atherosclerosis. Extensive mitral annular calcification. No pericardial effusion. Severe atherosclerosis involving the thoracic and upper abdominal aorta and their visualized branches. Ascending thoracic aortic ectasia, maximum axial dimensions 4.4 x 4.2 cm (previously 4.0 x 3.8 cm).  Lymphatic: Mildly enlarged bilateral hilar and mediastinal lymph nodes. Right hilar lymph nodes measure approximately 2.3 x 1.9 cm. Index AP window (mediastinal station 5) lymph node measures 2.7 x 2.0 cm, and multiple adjacent enlarged station 5 nodes are present. Index subcarinal (mediastinal station 7) lymph nodes measure approximately 2.3 x 2.8 cm.  Other findings: None.  Visualized lower neck: Visualized thyroid gland normal in size and appearance without nodularity. No supraclavicular lymphadenopathy.  Visualized upper abdomen: Normal in appearance for the unenhanced technique with the exception of extensive aortic and visceral artery atherosclerosis.  Musculoskeletal: Mild osseous demineralization. Degenerative disc disease involving the visualized lower cervical spine. Minimal degenerative disc disease involving the upper and mid thoracic spine.  IMPRESSION: 1. Pneumonia involving the left upper lobe and to a lesser degree the lower lobes bilaterally. 2. Bronchiectasis involving both lower lobes, progressive since 2010, with mucus plugging of the dilated bronchi in the lower lobes, right greater  than left. 3. Small bilateral pleural effusions. 4. Bilateral hilar and mediastinal lymphadenopathy, likely reactive. 5. COPD/emphysema. Marked central bronchial wall thickening consistent with bronchitis, likely acute superimposed upon chronic. 6. Cardiomegaly with severe 3 vessel coronary atherosclerosis. 7. Ectasia of the ascending thoracic aorta, maximum diameter 4.4 cm. Recommend annual imaging followup by CTA or MRA. This recommendation follows 2010 ACCF/AHA/AATS/ACR/ASA/SCA/SCAI/SIR/STS/SVM Guidelines for the Diagnosis and Management of Patients with Thoracic Aortic Disease. Circulation. 2010; 121: U981-X914. Imaging findings of potential clinical significance (RAF scoring):  Aortic Atherosclerosis (ICD10-170.0)  Emphysema. (NWG95-A21.9)   Electronically Signed  By: Hulan Saas M.D.  On: 07/19/2015 13:45       EKG:     Other:   Assessment and Plan: 1 Bilateral pneumonia cap, clinically slowly improving levaquin If possible sputum c/s  2 Bronchiectasis, progressive ? MAI  As per above Flutter valve  3 Late stage II copd , improved, still on high fi2 Resume pre admit regimen Solumedrol 0xygen Wean fio2 as tolerated  4 Bilateral hilar and mediastinal adenopathy, ??? 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  The Patient requires high complexity decision making for assessment and support, frequent evaluation and titration of therapies, application of advanced monitoring technologies and extensive interpretation of multiple databases.   Kandise Riehle,M.D. Pulmonary & Critical care Medicine [facility]

## 2015-07-20 NOTE — Care Management (Signed)
Offered patient choice of providers and patient stated that she would prefer to go with [facility]. Referral placed with Feliberto Gottron at [facility].   Continue to follow.

## 2015-07-20 NOTE — Progress Notes (Signed)
Initial Nutrition Assessment     INTERVENTION:  Meals and snacks: Pt may benefit from liberalizing diet to carb modified, not dialysis pt and salt restrictive diet order in place Nutrition Supplement Therapy: If unable to meet nutritional needs will add supplement   NUTRITION DIAGNOSIS:   Inadequate oral intake related to acute illness as evidenced by per patient/family report.    GOAL:   Patient will meet greater than or equal to 90% of their needs    MONITOR:    (Energy intake, Electrolyte and renal profile)  REASON FOR ASSESSMENT:    (diet order)    ASSESSMENT:      Pt admitted with sepsis, pneumonia, hyponatremia  Past Medical History  Diagnosis Date  . COPD (chronic obstructive pulmonary disease) Jan 2014  . A-fib   . Coronary artery disease     Current Nutrition: ate 100% of soup, peaches and few bites of salad today for lunch  Reports intake prior to admission for the past week has been decreased secondary to not feeling well   Gastrointestinal Profile: Last BM: 9/6   Medications: aspart, solumedrol, senokot, 3% NS for 12 hr only  Electrolyte/Renal Profile and Glucose Profile:   Recent Labs Lab 07/18/15 0459 07/19/15 0432  07/20/15 0123 07/20/15 0520 07/20/15 1243  NA 122* 121*  < > 127* 133* 133*  K 4.9 5.4*  --   --  5.8*  --   CL 89* 86*  --   --  97*  --   CO2 24 29  --   --  31  --   BUN 20 26*  --   --  29*  --   CREATININE 0.53 0.77  --   --  0.74  --   CALCIUM 8.2* 8.1*  --   --  8.6*  --   GLUCOSE 153* 180*  --   --  199*  --   < > = values in this interval not displayed.      Weight Trend since Admission: Filed Weights   07/17/15 1020  Weight: 161 lb 6 oz (73.199 kg)      Diet Order:  Diet renal with fluid restriction Fluid restriction:: 1200 mL Fluid; Room service appropriate?: Yes; Fluid consistency:: Thin  Skin:   reviewed   Height:   Ht Readings from Last 1 Encounters:  07/17/15  (1.549 m)     Weight:   Wt Readings from Last 1 Encounters:  07/17/15 161 lb 6 oz (73.199 kg)     BMI:  Body mass index is 30.51 kg/(m^2).  EDUCATION NEEDS:   No education needs identified at this time  LOW Care Level  Mercedes Valeriano B. Freida Busman, RD, LDN [phone] (pager)

## 2015-07-20 NOTE — Progress Notes (Signed)
Dr. Wynelle Link notified of serum Na 133.  Per MD, check at 1900, call MD w/ result and d/c future serial sodium checks.

## 2015-07-20 NOTE — Consult Note (Signed)
ANTICOAGULATION CONSULT NOTE - Initial Consult  Pharmacy Consult for warfarin Indication: atrial fibrillation  Allergies  Allergen Reactions  . Penicillins Rash   Patient Measurements: Height:  (154.9 cm) Weight: 161 lb 6 oz (73.199 kg) IBW/kg (Calculated) : 47.8  Vital Signs: Temp: 97.8 F (36.6 C) (09/06 2140) Temp Source: Oral (09/06 2140) BP: 163/74 mmHg (09/06 2140) Pulse Rate: 79 (09/06 2140)  Labs:  Recent Labs  07/17/15 1131  07/18/15 0459 07/19/15 0432 07/20/15 0520  HGB 12.2  --  12.0  --  11.6*  HCT 36.2  --  34.7*  --  34.1*  PLT 274  --  287  --  278  LABPROT  --   < > 33.5* 35.2* 30.5*  INR  --   < > 3.29 3.51 2.91  CREATININE 0.59  --  0.53 0.77  --   < > = values in this interval not displayed. Estimated Creatinine Clearance: 59.9 mL/min (by C-G formula based on Cr of 0.77).  Medical History: Past Medical History  Diagnosis Date  . COPD (chronic obstructive pulmonary disease) Jan 2014  . A-fib   . Coronary artery disease     Medications:  Scheduled:  . budesonide-formoterol  2 puff Inhalation BID  . diltiazem  180 mg Oral Daily  . insulin aspart  0-5 Units Subcutaneous QHS  . insulin aspart  0-9 Units Subcutaneous TID WC  . levofloxacin (LEVAQUIN) IV  750 mg Intravenous Q24H  . lisinopril  40 mg Oral Daily  . methylPREDNISolone (SOLU-MEDROL) injection  60 mg Intravenous 3 times per day  . metoprolol  100 mg Oral BID  . pravastatin  80 mg Oral Daily  . sodium chloride  3 mL Intravenous Q12H  . tiotropium  18 mcg Inhalation Daily  . tolvaptan  15 mg Oral Q24H  . warfarin  2 mg Oral q1800  . Warfarin - Pharmacist Dosing Inpatient   Does not apply q1800    Assessment: Pt is a 69 year old female with a hx of afib. She takes warfarin at home. She takes  qod alternating with 4.5mg .  9/3  Warfarin 4.5mg  given.   9/4  INR 3.25,  Warfarin 2.5mg  given.  9/5  INR 3.29 9/6 INR 3.51. No dose ordered for today. 9/7 INR 2.91  Goal of  Therapy:  INR 2-3 Monitor platelets by anticoagulation protocol: Yes   Plan:   INR in therapeutic range today. Warfarin 2 mg daily ordered. F/u INR in AM.  Akua Blethen S, RPh Clinical Pharmacist 07/20/2015,6:31 AM

## 2015-07-20 NOTE — Progress Notes (Signed)
Inpatient Diabetes Program Recommendations  AACE/ADA: New Consensus Statement on Inpatient Glycemic Control (2013)  Target Ranges:  Prepandial:   less than 140 mg/dL      Peak postprandial:   less than 180 mg/dL (1-2 hours)      Critically ill patients:  140 - 180 mg/dL   Results for SARON, VANORMAN (MRN [id number]) as of 07/20/2015 11:19  Ref. Range 07/19/2015 07:31 07/19/2015 11:29 07/19/2015 16:30 07/19/2015 21:40 07/20/2015 07:32  Glucose-Capillary Latest Ref Range: 65-99 mg/dL 409 (H) 811 (H) 914 (H) 194 (H) 189 (H)    Diabetes history: DM2 Outpatient Diabetes medications: Metformin 500 mg daily Current orders for Inpatient glycemic control: Novolog 0-9 units TID with meals, Novolog 0-5 units HS  Inpatient Diabetes Program Recommendations Insulin - Meal Coverage: If steroids are continued as ordered, please consider ordering Novolog 4 units TID with meals for meal coverage (in additon to Novolog correction scale).  Thanks, Orlando Penner, RN, MSN, CCRN, CDE Diabetes Coordinator Inpatient Diabetes Program [phone] (Team Pager from 8am to 5pm) [phone] ([facility] office) [phone] [facility] office) [phone] [facility] office)

## 2015-07-20 NOTE — Care Management Important Message (Signed)
Important Message  Patient Details  Name: [name] MRN: [id number] Date of Birth: November 02, 1946   Medicare Important Message Given:  Yes-third notification given    Verita Schneiders Allmond 07/20/2015, 2:33 PM

## 2015-07-20 NOTE — Progress Notes (Signed)
Pt. Sodium continued to be low 118 at 19:43. MD notified of value. 3% hypertonic fluid ordered to run at 30 mls/hr. Recheck  value  127 at 01:23. Fluid still infusing.

## 2015-07-20 NOTE — Progress Notes (Signed)
Post fall assessment: Fall unwitnessed by staff. Family states, "We attempted to help her to the [facility]. We did not call for any help. Pt slid out of recliner onto floor and her right hip made contact with the floor." No head injuries post fall. Pt states, "my head did not hit the floor" and "it was not a hard fall." 3 RNs and a NT heard the explanation and assessed the scene post fall. Pt found on BSC, smiling, and orientedx4. RN assess right hip: skin intact, no bleeding or bruising. Pt states she has no pain. V/S post-fall stable. Pt helped back to bed. Bed alarm is on.

## 2015-07-20 NOTE — Progress Notes (Signed)
CMP ordered, but BMP "in process" according to chart review->labs (CMP cancelled).  Dr. Ronn Melena states that he will monitor for lab results

## 2015-07-20 NOTE — Progress Notes (Signed)
Physical Therapy Treatment Patient Details Name: [name] MRN: [id number] DOB: 25-Jan-1946 Today's Date: 07/20/2015    History of Present Illness Pt has had 1-2 weeks of sbreathing difficulty    PT Comments    Pt reports feeling better than she had, but "not good yet either". Pt needs to use the bedside commode upon entering. Pt requires only set up for toileting. Pt participates in ambulation and long sit exercises in the chair. Pt encouraged to spend time sitting up focusing on deep breathing and LE exercises throughout the day. Pt did not wish and assistive device for ambulation although feel pt would benefit from rolling walker or SPC due to mild unsteadiness with slight staggering to the right with ambulation requiring Min guard for safety. Pt also without instruction to do so opted to hold onto IV with pole with 1 hand during ambulation Pt received up in chair; no alarm present in room. Nursing contacted and made aware and will obtain one. Pt and visitors instructed to please call for help to attempt out of the chair for any reason; they are agreeable and understand.   Follow Up Recommendations  Home health PT     Equipment Recommendations       Recommendations for Other Services       Precautions / Restrictions Precautions Precautions: Fall Restrictions Weight Bearing Restrictions: No    Mobility  Bed Mobility Overal bed mobility: Modified Independent             General bed mobility comments: Use of handrails   Transfers Overall transfer level: Independent Equipment used: None             General transfer comment: STS independently; several steps to bedside commode from bed and pt holds lightly onto therapist arm.   Ambulation/Gait Ambulation/Gait assistance: Min guard Ambulation Distance (Feet): 50 Feet (x2) Assistive device: None (pt does hold onto IV pole lightly with 1 hand) Gait Pattern/deviations: Step-through pattern;Staggering right (mild  staggering to the R at times; generally steady, no LOB) Gait velocity: decreased Gait velocity interpretation: <1.8 ft/sec, indicative of risk for recurrent falls General Gait Details: Pt offered assistive; pt refused   Stairs            Wheelchair Mobility    Modified Rankin (Stroke Patients Only)       Balance Overall balance assessment: Modified Independent;No apparent balance deficits (not formally assessed)                                  Cognition Arousal/Alertness: Awake/alert Behavior During Therapy: WFL for tasks assessed/performed Overall Cognitive Status: Within Functional Limits for tasks assessed                      Exercises General Exercises - Lower Extremity Ankle Circles/Pumps: AROM;Both;20 reps;Seated (long sit) Quad Sets: Strengthening;Both;20 reps;Seated (long sit) Gluteal Sets: Strengthening;Both;20 reps;Seated (long sit) Long Arc Quad: AROM;Both;20 reps;Seated Heel Slides: AROM;Both;20 reps;Seated (long sit) Hip ABduction/ADduction: AROM;Both;20 reps;Seated (long sit)    General Comments        Pertinent Vitals/Pain Pain Assessment: No/denies pain    Home Living                      Prior Function            PT Goals (current goals can now be found in the care plan section) Progress towards PT  goals: Progressing toward goals    Frequency  Min 2X/week    PT Plan Current plan remains appropriate    Co-evaluation             End of Session Equipment Utilized During Treatment: Gait belt;Oxygen Activity Tolerance: Patient tolerated treatment well;Patient limited by fatigue Patient left: in chair;with call bell/phone within reach;with family/visitor present (no chair alarm in room; contacted nsg who will obtain)     Time: 1126-1201 PT Time Calculation (min) (ACUTE ONLY): 35 min  Charges:  $Gait Training: 8-22 mins $Therapeutic Exercise: 8-22 mins                    G Codes:      Kristeen Miss 07/20/2015, 1:08 PM

## 2015-07-20 NOTE — Progress Notes (Signed)
[facility] [place] [facility]   PATIENT NAME: [name]    MR#:  [id number]  DATE OF BIRTH:  10-19-1946  SUBJECTIVE:  CHIEF COMPLAINT:   Chief Complaint  Patient presents with  . URI  somewhat better but still feels sob. REVIEW OF SYSTEMS:  Review of Systems  Constitutional: Negative for fever, weight loss, malaise/fatigue and diaphoresis.  HENT: Negative for ear discharge, ear pain, hearing loss, nosebleeds, sore throat and tinnitus.   Eyes: Negative for blurred vision and pain.  Respiratory: Positive for cough, shortness of breath and wheezing. Negative for hemoptysis.   Cardiovascular: Negative for chest pain, palpitations, orthopnea and leg swelling.  Gastrointestinal: Negative for heartburn, nausea, vomiting, abdominal pain, diarrhea, constipation and blood in stool.  Genitourinary: Negative for dysuria, urgency and frequency.  Musculoskeletal: Negative for myalgias and back pain.  Skin: Negative for itching and rash.  Neurological: Negative for dizziness, tingling, tremors, focal weakness, seizures, weakness and headaches.  Psychiatric/Behavioral: Negative for depression. The patient is not nervous/anxious.    DRUG ALLERGIES:   Allergies  Allergen Reactions  . Penicillins Rash   VITALS:  Blood pressure 148/84, pulse 98, temperature 97.6 F (36.4 C), temperature source Oral, resp. rate 20, height 5\' 1"  (1.549 m), weight 73.199 kg (161 lb 6 oz), SpO2 95 %. PHYSICAL EXAMINATION:  Physical Exam  Constitutional: She is oriented to person, place, and time and well-developed, well-nourished, and in no distress.  HENT:  Head: Normocephalic and atraumatic.  Eyes: Conjunctivae and EOM are normal. Pupils are equal, round, and reactive to light.  Neck: Normal range of motion. Neck supple. No tracheal deviation present. No thyromegaly present.  Cardiovascular: Normal rate, regular rhythm and normal heart sounds.   Pulmonary/Chest: No respiratory  distress. She has wheezes. She exhibits no tenderness.  Abdominal: Soft. Bowel sounds are normal. She exhibits no distension. There is no tenderness.  Musculoskeletal: Normal range of motion.  Neurological: She is alert and oriented to person, place, and time. No cranial nerve deficit.  Skin: Skin is warm and dry. No rash noted.  Psychiatric: Mood and affect normal.   LABORATORY PANEL:   CBC  Recent Labs Lab 07/20/15 0520  WBC 20.9*  HGB 11.6*  HCT 34.1*  PLT 278   ------------------------------------------------------------------------------------------------------------------ Chemistries   Recent Labs Lab 07/20/15 0520  NA 133*  K 5.8*  CL 97*  CO2 31  GLUCOSE 199*  BUN 29*  CREATININE 0.74  CALCIUM 8.6*   RADIOLOGY:  No results found. ASSESSMENT AND PLAN:  69 year old female with chronic respiratory failure on 2 L oxygen who presents with shortness of breath and found to have pneumonia.  1. Sepsis: Present on admission likely due to community-acquired pneumonia. Plan as outlined below.  2. Community-acquired pneumonia: continue Levaquin. Blood cultures negative. Continue IV fluids as well.  3. Acute on chronic hypoxic respiratory failure: This is primarily due to community-acquired pneumonia.  Continue steroids.  Still not looking much better. appreciate pulmonary input.  CT scan of the chest showed pna, effusion and bronchiectesis.  4. Hyponatremia/Hyperkalemia: Na Dropping 124->122->121->118->133, received 3% saline last night and also samsca. Good response   5. Atrial fibrillation: Heart rate is better controlled. Continue outpatient medications including metoprolol and diltiazem along with Coumadin. INR is 2.9  6. Diabetes:  ADA diet and send scale insulin. Metformin is on hold for now.  7. Essential hypertension: Continue metoprolol, diltiazem and lisinopril.    All the records are reviewed and case discussed with Care Management/Social  Worker. Management plans discussed with the patient, family and they are in agreement.  CODE STATUS: Full code  TOTAL TIME TAKING CARE OF THIS PATIENT: 35 minutes.   More than 50% of the time was spent in counseling/coordination of care: YES  POSSIBLE D/C IN 1-2 DAYS, DEPENDING ON CLINICAL CONDITION and pulmonary evaluation   [facility], Derrico Zhong M.D on 07/20/2015 at 12:56 PM  Between 7am to 6pm - Pager - [phone]  After 6pm go to [email or web address] - password [email or web address] [facility]  [facility]  Office  [phone]  CC:  Primary care physician; Fidel Levy, MD

## 2015-07-21 LAB — RENAL FUNCTION PANEL
ALBUMIN: 2.5 g/dL — AB (ref 3.5–5.0)
ANION GAP: 10 (ref 5–15)
BUN: 34 mg/dL — ABNORMAL HIGH (ref 6–20)
CALCIUM: 8.2 mg/dL — AB (ref 8.9–10.3)
CO2: 30 mmol/L (ref 22–32)
Chloride: 93 mmol/L — ABNORMAL LOW (ref 101–111)
Creatinine, Ser: 0.77 mg/dL (ref 0.44–1.00)
GLUCOSE: 220 mg/dL — AB (ref 65–99)
PHOSPHORUS: 3.2 mg/dL (ref 2.5–4.6)
POTASSIUM: 5.4 mmol/L — AB (ref 3.5–5.1)
SODIUM: 133 mmol/L — AB (ref 135–145)

## 2015-07-21 LAB — GLUCOSE, CAPILLARY
GLUCOSE-CAPILLARY: 277 mg/dL — AB (ref 65–99)
Glucose-Capillary: 205 mg/dL — ABNORMAL HIGH (ref 65–99)

## 2015-07-21 LAB — PROTIME-INR
INR: 2.8
PROTHROMBIN TIME: 29.6 s — AB (ref 11.4–15.0)

## 2015-07-21 MED ORDER — PREDNISONE 10 MG (21) PO TBPK: 10.0000 mg | ORAL_TABLET | Freq: Every day | ORAL | Status: DC

## 2015-07-21 MED ORDER — LEVOFLOXACIN 750 MG PO TABS: 750.0000 mg | ORAL_TABLET | Freq: Every day | ORAL | Status: DC

## 2015-07-21 MED ORDER — BENZONATATE 100 MG PO CAPS
100.0000 mg | ORAL_CAPSULE | Freq: Two times a day (BID) | ORAL | Status: DC
  Administered 2015-07-21: 100 mg via ORAL
  Filled 2015-07-21: qty 1

## 2015-07-21 NOTE — Progress Notes (Signed)
[facility]  ROUNDING NOTE   Subjective:   Patient sitting up in bed. States her coughing has improved.  Labs pending for today.   Objective:  Vital signs in last 24 hours:  Temp:  [97.7 F (36.5 C)-97.9 F (36.6 C)] 97.8 F (36.6 C) (09/08 0736) Pulse Rate:  [82-116] 109 (09/08 0924) Resp:  [17-20] 17 (09/08 0736) BP: (131-151)/(62-82) 138/64 mmHg (09/08 0924) SpO2:  [95 %-100 %] 100 % (09/08 0736)  Weight change:  Filed Weights   07/17/15 1020  Weight: 73.199 kg (161 lb 6 oz)    Intake/Output: I/O last 3 completed shifts: In: 1623.5 [P.O.:1100; I.V.:351.5; IV Piggyback:172] Out: 3625 [Urine:3625]   Intake/Output this shift:  Total I/O In: -  Out: 800 [Urine:800]  Physical Exam: General: NAD  Head: Normocephalic, atraumatic. Moist oral mucosal membranes  Eyes: Anicteric, PERRL  Neck: Supple, trachea midline  Lungs:  Left sided rhonchi  Heart: Regular rate and rhythm  Abdomen:  Soft, nontender,   Extremities: no peripheral edema.  Neurologic: Nonfocal, moving all four extremities  Skin: No lesions       Basic Metabolic Panel:  Recent Labs Lab 07/17/15 1131 07/18/15 0459 07/19/15 0432  07/19/15 1943 07/20/15 0123 07/20/15 0520 07/20/15 1243 07/20/15 1905  NA 124* 122* 121*  < > 118* 127* 133* 133* 133*  K 4.7 4.9 5.4*  --   --   --  5.8*  --   --   CL 86* 89* 86*  --   --   --  97*  --   --   CO2 29 24 29   --   --   --  31  --   --   GLUCOSE 139* 153* 180*  --   --   --  199*  --   --   BUN 15 20 26*  --   --   --  29*  --   --   CREATININE 0.59 0.53 0.77  --   --   --  0.74  --   --   CALCIUM 8.4* 8.2* 8.1*  --   --   --  8.6*  --   --   < > = values in this interval not displayed.  Liver Function Tests: No results for input(s): AST, ALT, ALKPHOS, BILITOT, PROT, ALBUMIN in the last 168 hours. No results for input(s): LIPASE, AMYLASE in the last 168 hours. No results for input(s): AMMONIA in the last 168 hours.  CBC:  Recent  Labs Lab 07/17/15 1131 07/18/15 0459 07/20/15 0520  WBC 14.2* 11.9* 20.9*  HGB 12.2 12.0 11.6*  HCT 36.2 34.7* 34.1*  MCV 92.2 92.7 94.3  PLT 274 287 278    Cardiac Enzymes: No results for input(s): CKTOTAL, CKMB, CKMBINDEX, TROPONINI in the last 168 hours.  BNP: Invalid input(s): POCBNP  CBG:  Recent Labs Lab 07/20/15 0732 07/20/15 1155 07/20/15 1633 07/20/15 2148 07/21/15 0734  GLUCAP 189* 283* 212* 168* 205*    Microbiology: Results for orders placed or performed during the hospital encounter of 07/17/15  Blood culture (routine x 2)     Status: None (Preliminary result)   Collection Time: 07/17/15 12:02 PM  Result Value Ref Range Status   Specimen Description BLOOD LEFT FOREARM  Final   Special Requests BOTTLES DRAWN AEROBIC AND ANAEROBIC  3CC  Final   Culture NO GROWTH 4 DAYS  Final   Report Status PENDING  Incomplete  Blood culture (routine x 2)  Status: None (Preliminary result)   Collection Time: 07/17/15 12:07 PM  Result Value Ref Range Status   Specimen Description BLOOD RIGHT ASSIST CONTROL  Final   Special Requests BOTTLES DRAWN AEROBIC AND ANAEROBIC  4CC  Final   Culture NO GROWTH 4 DAYS  Final   Report Status PENDING  Incomplete    Coagulation Studies:  Recent Labs  07/19/15 0432 07/20/15 0520 07/21/15 0459  LABPROT 35.2* 30.5* 29.6*  INR 3.51 2.91 2.80    Urinalysis: No results for input(s): COLORURINE, LABSPEC, PHURINE, GLUCOSEU, HGBUR, BILIRUBINUR, KETONESUR, PROTEINUR, UROBILINOGEN, NITRITE, LEUKOCYTESUR in the last 72 hours.  Invalid input(s): APPERANCEUR    Imaging: Ct Chest Wo Contrast  07/19/2015   CLINICAL DATA:  69 year old with acute superimposed upon chronic hypoxic respiratory failure. Followup community acquired left upper lobe pneumonia. Current history of COPD and coronary artery disease post stent placement.  EXAM: CT CHEST WITHOUT CONTRAST  TECHNIQUE: Multidetector CT imaging of the chest was performed following the  standard protocol without IV contrast.  COMPARISON:  CT chest 09/07/2009. Multiple intervening chest x-rays.  FINDINGS: Lungs: Confluent airspace consolidation with air bronchograms involving the left upper lobe. Patchy airspace opacities in the lower lobes. Mosaic attenuation involving both lungs. Emphysematous changes throughout both lungs, predominantly in the upper lobes, asymmetric and with greater involvement of the right lung as noted previously.  Trachea/bronchi: Bronchiectasis involving the lower lobes. Mucous filled bronchi in the lower lobes, right greater than left. Marked central bronchial wall thickening.  Pleura: Small bilateral pleural effusions. No visible pleural masses. Biapical pleuroparenchymal scarring, unchanged.  Mediastinum:  No mediastinal masses.  Heart and Vascular: Heart markedly enlarged. Severe 3 vessel coronary atherosclerosis. Extensive mitral annular calcification. No pericardial effusion. Severe atherosclerosis involving the thoracic and upper abdominal aorta and their visualized branches. Ascending thoracic aortic ectasia, maximum axial dimensions 4.4 x 4.2 cm (previously 4.0 x 3.8 cm).  Lymphatic: Mildly enlarged bilateral hilar and mediastinal lymph nodes. Right hilar lymph nodes measure approximately 2.3 x 1.9 cm. Index AP window (mediastinal station 5) lymph node measures 2.7 x 2.0 cm, and multiple adjacent enlarged station 5 nodes are present. Index subcarinal (mediastinal station 7) lymph nodes measure approximately 2.3 x 2.8 cm.  Other findings: None.  Visualized lower neck: Visualized thyroid gland normal in size and appearance without nodularity. No supraclavicular lymphadenopathy.  Visualized upper abdomen: Normal in appearance for the unenhanced technique with the exception of extensive aortic and visceral artery atherosclerosis.  Musculoskeletal: Mild osseous demineralization. Degenerative disc disease involving the visualized lower cervical spine. Minimal degenerative  disc disease involving the upper and mid thoracic spine.  IMPRESSION: 1. Pneumonia involving the left upper lobe and to a lesser degree the lower lobes bilaterally. 2. Bronchiectasis involving both lower lobes, progressive since 2010, with mucus plugging of the dilated bronchi in the lower lobes, right greater than left. 3. Small bilateral pleural effusions. 4. Bilateral hilar and mediastinal lymphadenopathy, likely reactive. 5. COPD/emphysema. Marked central bronchial wall thickening consistent with bronchitis, likely acute superimposed upon chronic. 6. Cardiomegaly with severe 3 vessel coronary atherosclerosis. 7. Ectasia of the ascending thoracic aorta, maximum diameter 4.4 cm. Recommend annual imaging followup by CTA or MRA. This recommendation follows 2010 ACCF/AHA/AATS/ACR/ASA/SCA/SCAI/SIR/STS/SVM Guidelines for the Diagnosis and Management of Patients with Thoracic Aortic Disease. Circulation. 2010; 121: Z610-R604. Imaging findings of potential clinical significance (RAF scoring):  Aortic Atherosclerosis (ICD10-170.0)  Emphysema. ([id number]-J19.9)   Electronically Signed   By: Hulan Saas M.D.   On: 07/19/2015 13:45  Medications:     . budesonide-formoterol  2 puff Inhalation BID  . diltiazem  180 mg Oral Daily  . insulin aspart  0-5 Units Subcutaneous QHS  . insulin aspart  0-9 Units Subcutaneous TID WC  . levofloxacin (LEVAQUIN) IV  750 mg Intravenous Q24H  . methylPREDNISolone (SOLU-MEDROL) injection  60 mg Intravenous 3 times per day  . metoprolol  100 mg Oral BID  . pravastatin  80 mg Oral Daily  . sodium chloride  3 mL Intravenous Q12H  . tiotropium  18 mcg Inhalation Daily  . warfarin  2 mg Oral q1800  . Warfarin - Pharmacist Dosing Inpatient   Does not apply q1800   acetaminophen **OR** acetaminophen, albuterol, alum & mag hydroxide-simeth, ondansetron **OR** ondansetron (ZOFRAN) IV, senna-docusate, zolpidem  Assessment/ Plan:  Ms. [name] is a 69 y.o. white female with  COPD, atrial fibrillation, coronary artery disease, left renal artery stenosis status post stent 2008, diabetes mellitus type II noninsulin dependent, hyperlipidemia, hypertension, who was admitted to [facility] on 07/17/2015 for Community acquired pneumonia [J18.9]   1. Hyponatremia: acute. 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 No echo available.  - agree with fluid restriction, continue 1200 mL/day - one dose of samsca 15mg  on 9/6 - hypertonic saline given overnight 9/6 - pending labs today.   2. Hyperkalemia: potassium 5.8 - holding lisinopril  - pending today's labs.   3. Pneumonia with acute COPD exacerbation: levofloxacin. Afebrile. Continue supportive care, oxygen and nebs. .   4. Hypertension: well controlled. hold lisinopril due to hyperkalemia - continue diltiazem and metoprolol.   LOS: 4 Glennie Rodda 9/8/201610:13 AM

## 2015-07-21 NOTE — Consult Note (Signed)
ANTICOAGULATION CONSULT NOTE - Initial Consult  Pharmacy Consult for warfarin Indication: atrial fibrillation  Allergies  Allergen Reactions  . Penicillins Rash   Patient Measurements: Height:  (154.9 cm) Weight: 161 lb 6 oz (73.199 kg) IBW/kg (Calculated) : 47.8  Vital Signs:    Labs:  Recent Labs  07/19/15 0432 07/20/15 0520 07/21/15 0459  HGB  --  11.6*  --   HCT  --  34.1*  --   PLT  --  278  --   LABPROT 35.2* 30.5* 29.6*  INR 3.51 2.91 2.80  CREATININE 0.77 0.74  --    Estimated Creatinine Clearance: 59.9 mL/min (by C-G formula based on Cr of 0.74).  Medical History: Past Medical History  Diagnosis Date  . COPD (chronic obstructive pulmonary disease) Jan 2014  . A-fib   . Coronary artery disease     Medications:  Scheduled:  . budesonide-formoterol  2 puff Inhalation BID  . diltiazem  180 mg Oral Daily  . insulin aspart  0-5 Units Subcutaneous QHS  . insulin aspart  0-9 Units Subcutaneous TID WC  . levofloxacin (LEVAQUIN) IV  750 mg Intravenous Q24H  . methylPREDNISolone (SOLU-MEDROL) injection  60 mg Intravenous 3 times per day  . metoprolol  100 mg Oral BID  . pravastatin  80 mg Oral Daily  . sodium chloride  3 mL Intravenous Q12H  . tiotropium  18 mcg Inhalation Daily  . warfarin  2 mg Oral q1800  . Warfarin - Pharmacist Dosing Inpatient   Does not apply q1800    Assessment: Pt is a 69 year old female with a hx of afib. She takes warfarin at home. She takes  qod alternating with 4.5mg .  9/3  Warfarin 4.5mg  given.   9/4  INR 3.25,  Warfarin 2.5mg  given.  9/5  INR 3.29 9/6 INR 3.51. No dose ordered for today. 9/7 INR 2.91 9/8 INR 2.80  Goal of Therapy:  INR 2-3 Monitor platelets by anticoagulation protocol: Yes   Plan:   INR in therapeutic range today. Warfarin 2 mg daily ordered. F/u INR in AM.  Zena Vitelli S, RPh Clinical Pharmacist 07/21/2015,6:06 AM

## 2015-07-21 NOTE — Discharge Instructions (Signed)
Pneumonia Pneumonia is an infection of the lungs.  CAUSES Pneumonia may be caused by bacteria or a virus. Usually, these infections are caused by breathing infectious particles into the lungs (respiratory tract). SIGNS AND SYMPTOMS   Cough.  Fever.  Chest pain.  Increased rate of breathing.  Wheezing.  Mucus production. DIAGNOSIS  If you have the common symptoms of pneumonia, your health care provider will typically confirm the diagnosis with a chest X-ray. The X-ray will show an abnormality in the lung (pulmonary infiltrate) if you have pneumonia. Other tests of your blood, urine, or sputum may be done to find the specific cause of your pneumonia. Your health care provider may also do tests (blood gases or pulse oximetry) to see how well your lungs are working. TREATMENT  Some forms of pneumonia may be spread to other people when you cough or sneeze. You may be asked to wear a mask before and during your exam. Pneumonia that is caused by bacteria is treated with antibiotic medicine. Pneumonia that is caused by the influenza virus may be treated with an antiviral medicine. Most other viral infections must run their course. These infections will not respond to antibiotics.  HOME CARE INSTRUCTIONS  1. Cough suppressants may be used if you are losing too much rest. However, coughing protects you by clearing your lungs. You should avoid using cough suppressants if you can. 2. Your health care provider may have prescribed medicine if he or she thinks your pneumonia is caused by bacteria or influenza. Finish your medicine even if you start to feel better. 3. Your health care provider may also prescribe an expectorant. This loosens the mucus to be coughed up. 4. Take medicines only as directed by your health care provider. 5. Do not smoke. Smoking is a common cause of bronchitis and can contribute to pneumonia. If you are a smoker and continue to smoke, your cough may last several weeks after your  pneumonia has cleared. 6. A cold steam vaporizer or humidifier in your room or home may help loosen mucus. 7. Coughing is often worse at night. Sleeping in a semi-upright position in a recliner or using a couple pillows under your head will help with this. 8. Get rest as you feel it is needed. Your body will usually let you know when you need to rest. PREVENTION A pneumococcal shot (vaccine) is available to prevent a common bacterial cause of pneumonia. This is usually suggested for:  People over 67 years old.  Patients on chemotherapy.  People with chronic lung problems, such as bronchitis or emphysema.  People with immune system problems. If you are over 65 or have a high risk condition, you may receive the pneumococcal vaccine if you have not received it before. In some countries, a routine influenza vaccine is also recommended. This vaccine can help prevent some cases of pneumonia.You may be offered the influenza vaccine as part of your care. If you smoke, it is time to quit. You may receive instructions on how to stop smoking. Your health care provider can provide medicines and counseling to help you quit. SEEK MEDICAL CARE IF: You have a fever. SEEK IMMEDIATE MEDICAL CARE IF:   Your illness becomes worse. This is especially true if you are elderly or weakened from any other disease.  You cannot control your cough with suppressants and are losing sleep.  You begin coughing up blood.  You develop pain which is getting worse or is uncontrolled with medicines.  Any of the symptoms  which initially brought you in for treatment are getting worse rather than better.  You develop shortness of breath or chest pain. MAKE SURE YOU:   Understand these instructions.  Will watch your condition.  Will get help right away if you are not doing well or get worse. Document Released: 10/29/2005 Document Revised: 03/15/2014 Document Reviewed: 01/18/2011 [facility] Patient Information 2015  [place], [place]. This information is not intended to replace advice given to you by your health care provider. Make sure you discuss any questions you have with your health care provider.  Chronic Obstructive Pulmonary Disease Chronic obstructive pulmonary disease (COPD) is a common lung problem. In COPD, the flow of air from the lungs is limited. The way your lungs work will probably never return to normal, but there are things you can do to improve your lungs and make yourself feel better. HOME CARE  Take all medicines as told by your doctor.  Avoid medicines or cough syrups that dry up your airway (such as antihistamines) and do not allow you to get rid of thick spit. You do not need to avoid them if told differently by your doctor.  If you smoke, stop. Smoking makes the problem worse.  Avoid being around things that make your breathing worse (like smoke, chemicals, and fumes).  Use oxygen therapy and therapy to help improve your lungs (pulmonary rehabilitation) if told by your doctor. If you need home oxygen therapy, ask your doctor if you should buy a tool to measure your oxygen level (oximeter).  Avoid people who have a sickness you can catch (contagious).  Avoid going outside when it is very hot, cold, or humid.  Eat healthy foods. Eat smaller meals more often. Rest before meals.  Stay active, but remember to also rest.  Make sure to get all the shots (vaccines) your doctor recommends. Ask your doctor if you need a pneumonia shot.  Learn and use tips on how to relax.  Learn and use tips on how to control your breathing as told by your doctor. Try:  Breathing in (inhaling) through your nose for 1 second. Then, pucker your lips and breath out (exhale) through your lips for 2 seconds.  Putting one hand on your belly (abdomen). Breathe in slowly through your nose for 1 second. Your hand on your belly should move out. Pucker your lips and breathe out slowly through your lips. Your  hand on your belly should move in as you breathe out.  Learn and use controlled coughing to clear thick spit from your lungs. The steps are: 9. Lean your head a little forward. 10. Breathe in deeply. 11. Try to hold your breath for 3 seconds. 12. Keep your mouth slightly open while coughing 2 times. 13. Spit any thick spit out into a tissue. 14. Rest and do the steps again 1 or 2 times as needed. GET HELP IF:  You cough up more thick spit than usual.  There is a change in the color or thickness of the spit.  It is harder to breathe than usual.  Your breathing is faster than usual. GET HELP RIGHT AWAY IF:   You have shortness of breath while resting.  You have shortness of breath that stops you from:  Being able to talk.  Doing normal activities.  You chest hurts for longer than 5 minutes.  Your skin color is more blue than usual.  Your pulse oximeter shows that you have low oxygen for longer than 5 minutes. MAKE SURE YOU:  Understand these instructions.  Will watch your condition.  Will get help right away if you are not doing well or get worse. Document Released: 04/16/2008 Document Revised: 03/15/2014 Document Reviewed: 06/25/2013 [facility] Patient Information 2015 [place], [place]. This information is not intended to replace advice given to you by your health care provider. Make sure you discuss any questions you have with your health care provider.

## 2015-07-21 NOTE — Progress Notes (Signed)
Pt d/c to home today.  IV removed intact.  Pt d/c instructions reviewed and all questions and concerns addressed.  Rx printed and given to pt w/all questions and concerns addressed.  Education printed on D/C paperwork and given to pt.  Pt was escorted by family to home.

## 2015-07-21 NOTE — Discharge Summary (Signed)
[facility] NAME: [name]    MR#:  [id number]  DATE OF BIRTH:  1946/03/09  DATE OF ADMISSION:  07/17/2015 ADMITTING PHYSICIAN: Bettey Costa, MD  DATE OF DISCHARGE: 07/21/2015   PRIMARY CARE PHYSICIAN: Dicky Doe, MD    ADMISSION DIAGNOSIS:  Community acquired pneumonia [J18.9] DISCHARGE DIAGNOSIS:  Active Problems:   Sepsis  community acquired pneumonia  Acute on chronic hypoxic respiratory failure Hyponatremia SECONDARY DIAGNOSIS:   Past Medical History  Diagnosis Date  . COPD (chronic obstructive pulmonary disease) Jan 2014  . A-fib   . Coronary artery disease    HOSPITAL COURSE:  69 year old female with chronic respiratory failure on 2 L oxygen who presents with shortness of breath and found to have pneumonia.  Patient also met criteria for sepsis present on admission.  Please see Dr. Trellis Paganini dictated history and physical for further details.  1. Sepsis: Present on admission now resolved.  2. Community-acquired pneumonia: Improving.  3. Acute on chronic hypoxic respiratory failure: due to community-acquired pneumonia.   4. 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  5. Atrial fibrillation: Heart rate is better controlled.   6. Diabetes: Patient will be an ADA diet and send scale insulin. Metformin is on hold for now.  7. Essential hypertension: Continue metoprolol, diltiazem and lisinopril.  8. CAD: Continue statin and metoprolol.  She is feeling much better and is close to her baseline.  She will need 2 L oxygen via nasal cannula at rest, which is a chronic oxygen requirement and recommend using 3 L on ambulation. She is agreeable with the discharge plans. DISCHARGE CONDITIONS:  stable CONSULTS OBTAINED:  Treatment Team:  Lavonia Dana,  MD Erby Pian, MD DRUG ALLERGIES:   Allergies  Allergen Reactions  . Penicillins Rash   DISCHARGE MEDICATIONS:   Current Discharge Medication List    START taking these medications   Details  levofloxacin (LEVAQUIN) 750 MG tablet Take 1 tablet (750 mg total) by mouth daily. Qty: 4 tablet, Refills: 0    predniSONE (STERAPRED UNI-PAK 21 TAB) 10 MG (21) TBPK tablet Take 1 tablet (10 mg total) by mouth daily. Start at 60 mg daily, taper 10 MG daily until finished Qty: 21 tablet, Refills: 0      CONTINUE these medications which have NOT CHANGED   Details  ACCU-CHEK AVIVA PLUS test strip 1 each by Other route daily.     albuterol (PROVENTIL) (2.5 MG/3ML) 0.083% nebulizer solution Take 2.5 mg by nebulization every 6 (six) hours as needed for wheezing.    budesonide-formoterol (SYMBICORT) 160-4.5 MCG/ACT inhaler Inhale 2 puffs into the lungs 2 (two) times daily.     diltiazem (CARDIZEM CD) 180 MG 24 hr capsule TAKE ONE (1) CAPSULE EACH DAY Refills: 3    furosemide (LASIX) 20 MG tablet Take 20 mg by mouth daily.     lisinopril (PRINIVIL,ZESTRIL) 40 MG tablet Take 40 mg by mouth daily.     metFORMIN (GLUCOPHAGE) 500 MG tablet Take 500 mg by mouth daily.     metoprolol (LOPRESSOR) 100 MG tablet Take 1 tablet (100 mg total) by mouth 2 (two) times daily. Qty: 180 tablet, Refills: 3    OXYGEN-HELIUM IN Inhale 2 L into the lungs continuous. 2 liters of oxygen.    potassium chloride SA (K-DUR,KLOR-CON) 20 MEQ  tablet Take 20 mEq by mouth 2 (two) times daily.     pravastatin (PRAVACHOL) 80 MG tablet Take 80 mg by mouth daily.     tiotropium (SPIRIVA) 18 MCG inhalation capsule Place 18 mcg into inhaler and inhale daily.     warfarin (COUMADIN) 3 MG tablet Take 1 tablet (3 mg total) by mouth daily. Every other day take 1.5 tabs by mouth of the 3 mg tabs and then 1tab of 31m on the other days. Same dose as before. Qty: 45 tablet, Refills: 3   Associated Diagnoses: Coagulation  defect      STOP taking these medications     predniSONE (DELTASONE) 5 MG tablet        DISCHARGE INSTRUCTIONS:   DIET:  Cardiac diet DISCHARGE CONDITION:  Good ACTIVITY:  Activity as tolerated OXYGEN:  Home Oxygen: Yes.    Oxygen Delivery: 2 liters/min via Patient connected to nasal cannula oxygen DISCHARGE LOCATION:  home   If you experience worsening of your admission symptoms, develop shortness of breath, life threatening emergency, suicidal or homicidal thoughts you must seek medical attention immediately by calling 911 or calling your MD immediately  if symptoms less severe.  You Must read complete instructions/literature along with all the possible adverse reactions/side effects for all the Medicines you take and that have been prescribed to you. Take any new Medicines after you have completely understood and accpet all the possible adverse reactions/side effects.   Please note  You were cared for by a hospitalist during your hospital stay. If you have any questions about your discharge medications or the care you received while you were in the hospital after you are discharged, you can call the unit and asked to speak with the hospitalist on call if the hospitalist that took care of you is not available. Once you are discharged, your primary care physician will handle any further medical issues. Please note that NO REFILLS for any discharge medications will be authorized once you are discharged, as it is imperative that you return to your primary care physician (or establish a relationship with a primary care physician if you do not have one) for your aftercare needs so that they can reassess your need for medications and monitor your lab values.    On the day of Discharge: VITAL SIGNS:  Blood pressure 138/64, pulse 109, temperature 97.8 F (36.6 C), temperature source Oral, resp. rate 17, height 5' 1"  (1.549 m), weight 73.199 kg (161 lb 6 oz), SpO2 91 %. PHYSICAL  EXAMINATION:  GENERAL:  69y.o.-year-old patient lying in the bed with no acute distress.  EYES: Pupils equal, round, reactive to light and accommodation. No scleral icterus. Extraocular muscles intact.  HEENT: Head atraumatic, normocephalic. Oropharynx and nasopharynx clear.  NECK:  Supple, no jugular venous distention. No thyroid enlargement, no tenderness.  LUNGS: Normal breath sounds bilaterally, no wheezing, rales,rhonchi or crepitation. No use of accessory muscles of respiration.  CARDIOVASCULAR: S1, S2 normal. No murmurs, rubs, or gallops.  ABDOMEN: Soft, non-tender, non-distended. Bowel sounds present. No organomegaly or mass.  EXTREMITIES: No pedal edema, cyanosis, or clubbing.  NEUROLOGIC: Cranial nerves II through XII are intact. Muscle strength 5/5 in all extremities. Sensation intact. Gait not checked.  PSYCHIATRIC: The patient is alert and oriented x 3.  SKIN: No obvious rash, lesion, or ulcer.  DATA REVIEW:   CBC  Recent Labs Lab 07/20/15 0520  WBC 20.9*  HGB 11.6*  HCT 34.1*  PLT 278    Chemistries  Recent Labs Lab 07/21/15 0459  NA 133*  K 5.4*  CL 93*  CO2 30  GLUCOSE 220*  BUN 34*  CREATININE 0.77  CALCIUM 8.2*    Cardiac Enzymes No results for input(s): TROPONINI in the last 168 hours.  Microbiology Results  Results for orders placed or performed during the hospital encounter of 07/17/15  Blood culture (routine x 2)     Status: None (Preliminary result)   Collection Time: 07/17/15 12:02 PM  Result Value Ref Range Status   Specimen Description BLOOD LEFT FOREARM  Final   Special Requests BOTTLES DRAWN AEROBIC AND ANAEROBIC  3CC  Final   Culture NO GROWTH 4 DAYS  Final   Report Status PENDING  Incomplete  Blood culture (routine x 2)     Status: None (Preliminary result)   Collection Time: 07/17/15 12:07 PM  Result Value Ref Range Status   Specimen Description BLOOD RIGHT ASSIST CONTROL  Final   Special Requests BOTTLES DRAWN AEROBIC AND  ANAEROBIC  4CC  Final   Culture NO GROWTH 4 DAYS  Final   Report Status PENDING  Incomplete    Management plans discussed with the patient, family and they are in agreement.  CODE STATUS: Full code  TOTAL TIME TAKING CARE OF THIS PATIENT: 55 minutes.    [facility], Bradden Tadros M.D on 07/21/2015 at 12:53 PM  Between 7am to 6pm - Pager - [phone]  After 6pm go to [email or web address] - password [email or web address] [facility]  Office  [phone]  CC: Primary care physician; Dicky Doe, MD Lavonia Dana, MD

## 2015-07-21 NOTE — Progress Notes (Signed)
Date: 07/21/2015,   MRN# [id number] [name] 1945-11-30 Code Status:     Code Status Orders        Start     Ordered   07/17/15 1650  Full code   Continuous     07/17/15 1649     Hosp day:@LENGTHOFSTAYDAYS @ Referring MD: @       HPI: Felling better, o2 down to 2 liters, less sob, wheezes, no coughs. No chest pain, leg pain, swelling, fever or chills. She on her way home .   PMHX:   Past Medical History  Diagnosis Date  . COPD (chronic obstructive pulmonary disease) Jan 2014  . A-fib   . Coronary artery disease    Surgical Hx:  Past Surgical History  Procedure Laterality Date  . Spine surgery  1999  . Joint replacement  1994, 1996, 2000    hip  . Coronary stent placement  2007  . Hip surgery    . Back surgery     Family Hx:  Family History  Problem Relation Age of Onset  . Heart failure Father   . Stroke Mother   . Cancer Sister 28    breast   Social Hx:   Social History  Substance Use Topics  . Smoking status: Former Smoker -- 2.00 packs/day for 40 years    Types: Cigarettes  . Smokeless tobacco: Never Used  . Alcohol Use: No   Medication:    Home Medication:  Current Outpatient Rx  Name  Route  Sig  Dispense  Refill  . levofloxacin (LEVAQUIN) 750 MG tablet   Oral   Take 1 tablet (750 mg total) by mouth daily.   4 tablet   0   . predniSONE (STERAPRED UNI-PAK 21 TAB) 10 MG (21) TBPK tablet   Oral   Take 1 tablet (10 mg total) by mouth daily. Start at 60 mg daily, taper 10 MG daily until finished   21 tablet   0     Current Medication: @   Allergies:  Penicillins  Review of Systems: Gen:  Denies  fever, sweats, chills HEENT: Denies blurred vision, double vision, ear pain, eye pain, hearing loss, nose bleeds, sore throat Cvc:  No dizziness, chest pain or heaviness Resp: on 2 liters, comfortable, min cough, wheeze, less sob, no pleurisy or calf pain   Gi: Denies swallowing difficulty, stomach pain, nausea or vomiting,  diarrhea, constipation, bowel incontinence Gu:  Denies bladder incontinence, burning urine Ext:   No Joint pain, stiffness or swelling Skin: No skin rash, easy bruising or bleeding or hives Endoc:  No polyuria, polydipsia , polyphagia or weight change Psych: No depression, insomnia or hallucinations  Other:  All other systems negative  Physical Examination:   VS: BP 138/64 mmHg  Pulse 109  Temp(Src) 97.8 F (36.6 C) (Oral)  Resp 17  Ht  (1.549 m)  Wt 161 lb 6 oz (73.199 kg)  BMI 30.51 kg/m2  SpO2 91%  General Appearance: No distress  Neuro: without focal findings, mental status, speech normal, alert and oriented, cranial nerves 2-12 intact, reflexes normal and symmetric, sensation grossly normal  HEENT: PERRLA, EOM intact, no ptosis, no other lesions noticed Pulmonary:.rare wheezing, No rales  No use of accessory muscles   Cardiovascular:  Normal S1,S2.  No m/r/g.    Abdomen:Benign, Soft, non-tender, No masses, hepatosplenomegaly, No lymphadenopathy Endoc: No evident thyromegaly, no signs of acromegaly or Cushing features Skin:   warm, no rashes, no ecchymosis  Extremities: normal, no  cyanosis, clubbing, no edema, warm with normal capillary refill. Other findings:   Labs results:   Recent Labs     07/19/15  0432  07/20/15  0520  07/21/15  0459  HGB   --   11.6*   --   HCT   --   34.1*   --   MCV   --   94.3   --   WBC   --   20.9*   --   BUN  26*  29*  34*  CREATININE  0.77  0.74  0.77  GLUCOSE  180*  199*  220*  CALCIUM  8.1*  8.6*  8.2*  INR  3.51  2.91  2.80  ,      EKG:     Other:   Assessment and Plan: 1 Bilateral pneumonia cap, clinically slowly improving levaquin X 10 DAYS Repeat cxr in one week  2 Bronchiectasis, progressive ? MAI  As per above Flutter valve  3 Late stage II copd , improved, still on high fi2 Resume pre admit regimen pred taper 0xygen at 2 liters See me in one week at [facility]  4 Bilateral hilar and mediastinal adenopathy,  ??? 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  The Patient requires high complexity decision making for assessment and support, frequent evaluation and titration of therapies, application of advanced monitoring technologies and extensive interpretation of multiple databases.   Dema Timmons,M.D. Pulmonary & Critical care Medicine [facility]

## 2015-07-21 NOTE — Progress Notes (Signed)
Follow up  per M.D. Request concerning Na Lab draw at 1905 resulted in 133. Dr. Wynelle Link made aware, no new orders at this time. Will continue to monitor per orders and unit protocol.

## 2015-07-21 NOTE — Care Management (Signed)
Notified Feliberto Gottron of  [facility] of patient discharge today.

## 2015-07-22 ENCOUNTER — Telehealth: Payer: Self-pay | Admitting: Family Medicine

## 2015-07-22 DIAGNOSIS — D689 Coagulation defect, unspecified: Secondary | ICD-10-CM

## 2015-07-22 LAB — CULTURE, BLOOD (ROUTINE X 2)
CULTURE: NO GROWTH
Culture: NO GROWTH

## 2015-07-22 MED ORDER — WARFARIN SODIUM 2 MG PO TABS: 2.0000 mg | ORAL_TABLET | Freq: Every day | ORAL | Status: DC

## 2015-07-22 NOTE — Telephone Encounter (Signed)
Patient is unsure what dose of Warfarin she should take and hospital said they should ask PCP. The hosp had her on . She also said Sugar is up 176. I advised that it is from the prednisone and she agreed that she has had elevations since then. I advised her to call if she gets a very high reading in 200s but as far as this number it is not that critical. She will await order for Warfarin.[facility]

## 2015-07-22 NOTE — Telephone Encounter (Signed)
Stay on the dose of Warfarin that she was on in the hospital.  Need a INR next week to check   Cont. 2 mg. Until then.  Agree with watching BS until off prednisone.-jh

## 2015-07-22 NOTE — Telephone Encounter (Signed)
Pt was just released from [facility].  She said her sugar has been running high and has questions about warfarin.  Please call [phone]

## 2015-07-22 NOTE — Telephone Encounter (Signed)
advised

## 2015-07-27 ENCOUNTER — Telehealth: Payer: Self-pay | Admitting: Family Medicine

## 2015-07-27 NOTE — Telephone Encounter (Signed)
Noted-jh 

## 2015-07-27 NOTE — Telephone Encounter (Signed)
Mandy from [facility] called to let Dr. Juanetta Gosling know that pt refused home care.  Mandy's call back number is [phone]

## 2015-07-28 ENCOUNTER — Ambulatory Visit (INDEPENDENT_AMBULATORY_CARE_PROVIDER_SITE_OTHER): Payer: Medicare Other | Admitting: Family Medicine

## 2015-07-28 ENCOUNTER — Encounter: Payer: Self-pay | Admitting: Family Medicine

## 2015-07-28 VITALS — BP 127/75 | HR 93 | Temp 98.2°F | Resp 16 | Ht 62.0 in | Wt 159.8 lb

## 2015-07-28 DIAGNOSIS — Z23 Encounter for immunization: Secondary | ICD-10-CM | POA: Diagnosis not present

## 2015-07-28 DIAGNOSIS — I10 Essential (primary) hypertension: Secondary | ICD-10-CM | POA: Diagnosis not present

## 2015-07-28 DIAGNOSIS — J431 Panlobular emphysema: Secondary | ICD-10-CM

## 2015-07-28 DIAGNOSIS — J189 Pneumonia, unspecified organism: Secondary | ICD-10-CM

## 2015-07-28 DIAGNOSIS — I481 Persistent atrial fibrillation: Secondary | ICD-10-CM

## 2015-07-28 DIAGNOSIS — I2510 Atherosclerotic heart disease of native coronary artery without angina pectoris: Secondary | ICD-10-CM | POA: Diagnosis not present

## 2015-07-28 DIAGNOSIS — N2889 Other specified disorders of kidney and ureter: Secondary | ICD-10-CM | POA: Diagnosis not present

## 2015-07-28 DIAGNOSIS — E119 Type 2 diabetes mellitus without complications: Secondary | ICD-10-CM | POA: Diagnosis not present

## 2015-07-28 DIAGNOSIS — I4819 Other persistent atrial fibrillation: Secondary | ICD-10-CM

## 2015-07-28 MED ORDER — FUROSEMIDE 20 MG PO TABS: ORAL_TABLET | ORAL | Status: DC

## 2015-07-28 NOTE — Progress Notes (Signed)
Name: [name]   MRN: [id number]    DOB: [birth year]   Date:07/28/2015       Progress Note  Subjective  Chief Complaint  Chief Complaint  Patient presents with  . Hospitalization Follow-up    Pneumonia f/u: pt still coughing pink colored mucus she is on 3L with exertion.    HPI Here for f/u of hospitalization for pneumonia and COPD.  She is having more pedal edema since she was in hosp.  Has finished her Levaquin and higher dose of prednisone.  Back on 5 mg.d maintenance.  BSs a home 90-110.  Breathing is so-so.  No problem-specific assessment & plan notes found for this encounter.   Past Medical History  Diagnosis Date  . COPD (chronic obstructive pulmonary disease) Jan 2014  . A-fib   . Coronary artery disease     Past Surgical History  Procedure Laterality Date  . Spine surgery  1999  . Joint replacement  1994, 1996, 2000    hip  . Coronary stent placement  2007  . Hip surgery    . Back surgery      Family History  Problem Relation Age of Onset  . Heart failure Father   . Stroke Mother   . Cancer Sister 29    breast    Social History   Social History  . Marital Status: Divorced    Spouse Name: N/A  . Number of Children: N/A  . Years of Education: N/A   Occupational History  . Not on file.   Social History Main Topics  . Smoking status: Former Smoker -- 2.00 packs/day for 40 years    Types: Cigarettes  . Smokeless tobacco: Never Used  . Alcohol Use: No  . Drug Use: No  . Sexual Activity: Not on file   Other Topics Concern  . Not on file   Social History Narrative     Current outpatient prescriptions:  .  ACCU-CHEK AVIVA PLUS test strip, 1 each by Other route daily. , Disp: , Rfl:  .  albuterol (PROVENTIL) (2.5 MG/3ML) 0.083% nebulizer solution, Take 2.5 mg by nebulization every 6 (six) hours as needed for wheezing., Disp: , Rfl:  .  budesonide-formoterol (SYMBICORT) 160-4.5 MCG/ACT inhaler, Inhale 2 puffs into the lungs 2 (two) times daily. ,  Disp: , Rfl:  .  diltiazem (CARDIZEM CD) 180 MG 24 hr capsule, TAKE ONE (1) CAPSULE EACH DAY, Disp: , Rfl: 3 .  furosemide (LASIX) 20 MG tablet, Take 20 mg by mouth daily. , Disp: , Rfl:  .  lisinopril (PRINIVIL,ZESTRIL) 40 MG tablet, Take 40 mg by mouth daily. , Disp: , Rfl:  .  metFORMIN (GLUCOPHAGE) 500 MG tablet, Take 500 mg by mouth daily. , Disp: , Rfl:  .  metoprolol (LOPRESSOR) 100 MG tablet, Take 1 tablet (100 mg total) by mouth 2 (two) times daily., Disp: 180 tablet, Rfl: 3 .  OXYGEN-HELIUM IN, Inhale 2 L into the lungs continuous. 2 liters of oxygen., Disp: , Rfl:  .  potassium chloride SA (K-DUR,KLOR-CON) 20 MEQ tablet, Take 20 mEq by mouth 2 (two) times daily. , Disp: , Rfl:  .  pravastatin (PRAVACHOL) 80 MG tablet, Take 80 mg by mouth daily. , Disp: , Rfl:  .  predniSONE (STERAPRED UNI-PAK 21 TAB) 10 MG (21) TBPK tablet, Take 1 tablet (10 mg total) by mouth daily. Start at 60 mg daily, taper 10 MG daily until finished (Patient taking differently: Take 5 mg by mouth daily. Start  at 60 mg daily, taper 10 MG daily until finished), Disp: 21 tablet, Rfl: 0 .  tiotropium (SPIRIVA) 18 MCG inhalation capsule, Place 18 mcg into inhaler and inhale daily. , Disp: , Rfl:  .  warfarin (COUMADIN) 2 MG tablet, Take 1 tablet (2 mg total) by mouth daily., Disp: 30 tablet, Rfl: 0 .  levofloxacin (LEVAQUIN) 750 MG tablet, Take 1 tablet (750 mg total) by mouth daily. (Patient not taking: Reported on 07/28/2015), Disp: 4 tablet, Rfl: 0  Allergies  Allergen Reactions  . Penicillins Rash     Review of Systems  Constitutional: Positive for malaise/fatigue. Negative for fever, chills and weight loss.  HENT: Negative for hearing loss.   Eyes: Negative for blurred vision and double vision.  Respiratory: Positive for cough (getting b etter), sputum production and shortness of breath. Negative for wheezing.   Cardiovascular: Positive for leg swelling. Negative for chest pain, palpitations and orthopnea.   Gastrointestinal: Negative for heartburn, nausea, vomiting, abdominal pain and diarrhea.  Genitourinary: Negative for dysuria, urgency and frequency.  Skin: Negative for rash.  Neurological: Positive for weakness. Negative for dizziness, sensory change, focal weakness and headaches.      Objective  Filed Vitals:   07/28/15 1053  BP: 127/75  Pulse: 93  Temp: 98.2 F (36.8 C)  TempSrc: Oral  Resp: 16  Height: 5' 2"  (1.575 m)  Weight: 159 lb 12.8 oz (72.485 kg)  SpO2: 90%    Physical Exam  Constitutional: She is oriented to person, place, and time and well-developed, well-nourished, and in no distress. No distress.  wearing O2 nasal canula  HENT:  Head: Normocephalic and atraumatic.  Neck: Normal range of motion. Neck supple. Carotid bruit is not present. No thyromegaly present.  Cardiovascular: Normal rate.  An irregular rhythm present.  Murmur heard.  Systolic murmur is present with a grade of 2/6  throughout  Pulmonary/Chest: Accessory muscle usage present. She is in respiratory distress (mild). She has decreased breath sounds in the right upper field, the right middle field, the right lower field, the left upper field, the left middle field and the left lower field.  Musculoskeletal: She exhibits edema (1+ pitting edema of bilat. feet and ankles).  Lymphadenopathy:    She has no cervical adenopathy.  Neurological: She is alert and oriented to person, place, and time.  Vitals reviewed.      Recent Results (from the past 2160 hour(s))  INR/PT     Status: Abnormal   Collection Time: 06/07/15  9:55 AM  Result Value Ref Range   INR 2.6 (H) 0.8 - 1.2    Comment: Reference interval is for non-anticoagulated patients. Suggested INR therapeutic range for Vitamin K antagonist therapy:    Standard Dose (moderate intensity                   therapeutic range):       2.0 - 3.0    Higher intensity therapeutic range       2.5 - 3.5    Prothrombin Time 26.9 (H) 9.1 - 12.0  sec  Basic metabolic panel     Status: Abnormal   Collection Time: 07/17/15 11:31 AM  Result Value Ref Range   Sodium 124 (L) 135 - 145 mmol/L   Potassium 4.7 3.5 - 5.1 mmol/L   Chloride 86 (L) 101 - 111 mmol/L   CO2 29 22 - 32 mmol/L   Glucose, Bld 139 (H) 65 - 99 mg/dL   BUN 15 6 - 20  mg/dL   Creatinine, Ser 0.59 0.44 - 1.00 mg/dL   Calcium 8.4 (L) 8.9 - 10.3 mg/dL   GFR calc non Af Amer >60 >60 mL/min   GFR calc Af Amer >60 >60 mL/min    Comment: (NOTE) The eGFR has been calculated using the CKD EPI equation. This calculation has not been validated in all clinical situations. eGFR's persistently <60 mL/min signify possible Chronic Kidney Disease.    Anion gap 9 5 - 15  CBC     Status: Abnormal   Collection Time: 07/17/15 11:31 AM  Result Value Ref Range   WBC 14.2 (H) 3.6 - 11.0 K/uL   RBC 3.92 3.80 - 5.20 MIL/uL   Hemoglobin 12.2 12.0 - 16.0 g/dL   HCT 36.2 35.0 - 47.0 %   MCV 92.2 80.0 - 100.0 fL   MCH 31.2 26.0 - 34.0 pg   MCHC 33.8 32.0 - 36.0 g/dL   RDW 14.3 11.5 - 14.5 %   Platelets 274 150 - 440 K/uL  Protime-INR     Status: Abnormal   Collection Time: 07/17/15 11:42 AM  Result Value Ref Range   Prothrombin Time 33.1 (H) 11.4 - 15.0 seconds   INR 3.24   Blood culture (routine x 2)     Status: None   Collection Time: 07/17/15 12:02 PM  Result Value Ref Range   Specimen Description BLOOD LEFT FOREARM    Special Requests BOTTLES DRAWN AEROBIC AND ANAEROBIC  3CC    Culture NO GROWTH 5 DAYS    Report Status 07/22/2015 FINAL   Blood culture (routine x 2)     Status: None   Collection Time: 07/17/15 12:07 PM  Result Value Ref Range   Specimen Description BLOOD RIGHT ASSIST CONTROL    Special Requests BOTTLES DRAWN AEROBIC AND ANAEROBIC  4CC    Culture NO GROWTH 5 DAYS    Report Status 07/22/2015 FINAL   Lactic acid, plasma     Status: None   Collection Time: 07/17/15 12:45 PM  Result Value Ref Range   Lactic Acid, Venous 1.3 0.5 - 2.0 mmol/L  Lactic acid,  plasma     Status: None   Collection Time: 07/17/15  5:16 PM  Result Value Ref Range   Lactic Acid, Venous 1.3 0.5 - 2.0 mmol/L  Glucose, capillary     Status: Abnormal   Collection Time: 07/17/15 10:39 PM  Result Value Ref Range   Glucose-Capillary 156 (H) 65 - 99 mg/dL   Comment 1 Notify RN   Basic metabolic panel     Status: Abnormal   Collection Time: 07/18/15  4:59 AM  Result Value Ref Range   Sodium 122 (L) 135 - 145 mmol/L   Potassium 4.9 3.5 - 5.1 mmol/L   Chloride 89 (L) 101 - 111 mmol/L   CO2 24 22 - 32 mmol/L   Glucose, Bld 153 (H) 65 - 99 mg/dL   BUN 20 6 - 20 mg/dL   Creatinine, Ser 0.53 0.44 - 1.00 mg/dL   Calcium 8.2 (L) 8.9 - 10.3 mg/dL   GFR calc non Af Amer >60 >60 mL/min   GFR calc Af Amer >60 >60 mL/min    Comment: (NOTE) The eGFR has been calculated using the CKD EPI equation. This calculation has not been validated in all clinical situations. eGFR's persistently <60 mL/min signify possible Chronic Kidney Disease.    Anion gap 9 5 - 15  CBC     Status: Abnormal   Collection Time: 07/18/15  4:59 AM  Result Value Ref Range   WBC 11.9 (H) 3.6 - 11.0 K/uL   RBC 3.75 (L) 3.80 - 5.20 MIL/uL   Hemoglobin 12.0 12.0 - 16.0 g/dL   HCT 34.7 (L) 35.0 - 47.0 %   MCV 92.7 80.0 - 100.0 fL   MCH 32.0 26.0 - 34.0 pg   MCHC 34.5 32.0 - 36.0 g/dL   RDW 14.1 11.5 - 14.5 %   Platelets 287 150 - 440 K/uL  Protime-INR     Status: Abnormal   Collection Time: 07/18/15  4:59 AM  Result Value Ref Range   Prothrombin Time 33.5 (H) 11.4 - 15.0 seconds   INR 3.29   Glucose, capillary     Status: Abnormal   Collection Time: 07/18/15  7:48 AM  Result Value Ref Range   Glucose-Capillary 174 (H) 65 - 99 mg/dL   Comment 1 Notify RN   Glucose, capillary     Status: Abnormal   Collection Time: 07/18/15 11:32 AM  Result Value Ref Range   Glucose-Capillary 274 (H) 65 - 99 mg/dL   Comment 1 Notify RN   Glucose, capillary     Status: Abnormal   Collection Time: 07/18/15  4:35 PM   Result Value Ref Range   Glucose-Capillary 148 (H) 65 - 99 mg/dL   Comment 1 Notify RN   Glucose, capillary     Status: Abnormal   Collection Time: 07/18/15  9:33 PM  Result Value Ref Range   Glucose-Capillary 175 (H) 65 - 99 mg/dL  Protime-INR     Status: Abnormal   Collection Time: 07/19/15  4:32 AM  Result Value Ref Range   Prothrombin Time 35.2 (H) 11.4 - 15.0 seconds   INR 7.67   Basic metabolic panel     Status: Abnormal   Collection Time: 07/19/15  4:32 AM  Result Value Ref Range   Sodium 121 (L) 135 - 145 mmol/L   Potassium 5.4 (H) 3.5 - 5.1 mmol/L   Chloride 86 (L) 101 - 111 mmol/L   CO2 29 22 - 32 mmol/L   Glucose, Bld 180 (H) 65 - 99 mg/dL   BUN 26 (H) 6 - 20 mg/dL   Creatinine, Ser 0.77 0.44 - 1.00 mg/dL   Calcium 8.1 (L) 8.9 - 10.3 mg/dL   GFR calc non Af Amer >60 >60 mL/min   GFR calc Af Amer >60 >60 mL/min    Comment: (NOTE) The eGFR has been calculated using the CKD EPI equation. This calculation has not been validated in all clinical situations. eGFR's persistently <60 mL/min signify possible Chronic Kidney Disease.    Anion gap 6 5 - 15  Glucose, capillary     Status: Abnormal   Collection Time: 07/19/15  7:31 AM  Result Value Ref Range   Glucose-Capillary 177 (H) 65 - 99 mg/dL  Glucose, capillary     Status: Abnormal   Collection Time: 07/19/15 11:29 AM  Result Value Ref Range   Glucose-Capillary 243 (H) 65 - 99 mg/dL  Sodium     Status: Abnormal   Collection Time: 07/19/15  1:48 PM  Result Value Ref Range   Sodium 117 (LL) 135 - 145 mmol/L    Comment: CRITICAL RESULT CALLED TO, READ BACK BY AND VERIFIED WITH STEVEN SYKES@1415  ON 07/19/15 BY HP   Osmolality     Status: Abnormal   Collection Time: 07/19/15  1:48 PM  Result Value Ref Range   Osmolality 267 (L) 275 - 300 mOsm/kg  Sodium,  urine, random     Status: None   Collection Time: 07/19/15  3:48 PM  Result Value Ref Range   Sodium, Ur <10 mmol/L  Osmolality, urine     Status: None    Collection Time: 07/19/15  3:48 PM  Result Value Ref Range   Osmolality, Ur 442 390 - 1090 mOsm/kg  Glucose, capillary     Status: Abnormal   Collection Time: 07/19/15  4:30 PM  Result Value Ref Range   Glucose-Capillary 203 (H) 65 - 99 mg/dL  Sodium     Status: Abnormal   Collection Time: 07/19/15  7:43 PM  Result Value Ref Range   Sodium 118 (LL) 135 - 145 mmol/L    Comment: CRITICAL RESULT CALLED TO, READ BACK BY AND VERIFIED WITH  DAWN SONGSTER AT 2037 07/19/15 SDR   Glucose, capillary     Status: Abnormal   Collection Time: 07/19/15  9:40 PM  Result Value Ref Range   Glucose-Capillary 194 (H) 65 - 99 mg/dL   Comment 1 Notify RN   Sodium     Status: Abnormal   Collection Time: 07/20/15  1:23 AM  Result Value Ref Range   Sodium 127 (L) 135 - 145 mmol/L  Protime-INR     Status: Abnormal   Collection Time: 07/20/15  5:20 AM  Result Value Ref Range   Prothrombin Time 30.5 (H) 11.4 - 15.0 seconds   INR 0.96   Basic metabolic panel     Status: Abnormal   Collection Time: 07/20/15  5:20 AM  Result Value Ref Range   Sodium 133 (L) 135 - 145 mmol/L   Potassium 5.8 (H) 3.5 - 5.1 mmol/L   Chloride 97 (L) 101 - 111 mmol/L   CO2 31 22 - 32 mmol/L   Glucose, Bld 199 (H) 65 - 99 mg/dL   BUN 29 (H) 6 - 20 mg/dL   Creatinine, Ser 0.74 0.44 - 1.00 mg/dL   Calcium 8.6 (L) 8.9 - 10.3 mg/dL   GFR calc non Af Amer >60 >60 mL/min   GFR calc Af Amer >60 >60 mL/min    Comment: (NOTE) The eGFR has been calculated using the CKD EPI equation. This calculation has not been validated in all clinical situations. eGFR's persistently <60 mL/min signify possible Chronic Kidney Disease.    Anion gap 5 5 - 15  CBC     Status: Abnormal   Collection Time: 07/20/15  5:20 AM  Result Value Ref Range   WBC 20.9 (H) 3.6 - 11.0 K/uL   RBC 3.61 (L) 3.80 - 5.20 MIL/uL   Hemoglobin 11.6 (L) 12.0 - 16.0 g/dL   HCT 34.1 (L) 35.0 - 47.0 %   MCV 94.3 80.0 - 100.0 fL   MCH 32.0 26.0 - 34.0 pg   MCHC 34.0 32.0  - 36.0 g/dL   RDW 14.3 11.5 - 14.5 %   Platelets 278 150 - 440 K/uL  Glucose, capillary     Status: Abnormal   Collection Time: 07/20/15  7:32 AM  Result Value Ref Range   Glucose-Capillary 189 (H) 65 - 99 mg/dL  Glucose, capillary     Status: Abnormal   Collection Time: 07/20/15 11:55 AM  Result Value Ref Range   Glucose-Capillary 283 (H) 65 - 99 mg/dL  serum sodium     Status: Abnormal   Collection Time: 07/20/15 12:43 PM  Result Value Ref Range   Sodium 133 (L) 135 - 145 mmol/L  Glucose, capillary     Status:  Abnormal   Collection Time: 07/20/15  4:33 PM  Result Value Ref Range   Glucose-Capillary 212 (H) 65 - 99 mg/dL  Sodium     Status: Abnormal   Collection Time: 07/20/15  7:05 PM  Result Value Ref Range   Sodium 133 (L) 135 - 145 mmol/L  Glucose, capillary     Status: Abnormal   Collection Time: 07/20/15  9:48 PM  Result Value Ref Range   Glucose-Capillary 168 (H) 65 - 99 mg/dL   Comment 1 Notify RN   Protime-INR     Status: Abnormal   Collection Time: 07/21/15  4:59 AM  Result Value Ref Range   Prothrombin Time 29.6 (H) 11.4 - 15.0 seconds   INR 2.80   Renal function panel     Status: Abnormal   Collection Time: 07/21/15  4:59 AM  Result Value Ref Range   Sodium 133 (L) 135 - 145 mmol/L   Potassium 5.4 (H) 3.5 - 5.1 mmol/L   Chloride 93 (L) 101 - 111 mmol/L   CO2 30 22 - 32 mmol/L   Glucose, Bld 220 (H) 65 - 99 mg/dL   BUN 34 (H) 6 - 20 mg/dL   Creatinine, Ser 0.77 0.44 - 1.00 mg/dL   Calcium 8.2 (L) 8.9 - 10.3 mg/dL   Phosphorus 3.2 2.5 - 4.6 mg/dL   Albumin 2.5 (L) 3.5 - 5.0 g/dL   GFR calc non Af Amer >60 >60 mL/min   GFR calc Af Amer >60 >60 mL/min    Comment: (NOTE) The eGFR has been calculated using the CKD EPI equation. This calculation has not been validated in all clinical situations. eGFR's persistently <60 mL/min signify possible Chronic Kidney Disease.    Anion gap 10 5 - 15  Glucose, capillary     Status: Abnormal   Collection Time:  07/21/15  7:34 AM  Result Value Ref Range   Glucose-Capillary 205 (H) 65 - 99 mg/dL   Comment 1 Notify RN   Glucose, capillary     Status: Abnormal   Collection Time: 07/21/15 11:17 AM  Result Value Ref Range   Glucose-Capillary 277 (H) 65 - 99 mg/dL   Comment 1 Notify RN      Assessment & Plan  Problem List Items Addressed This Visit      Cardiovascular and Mediastinum   Coronary atherosclerosis   A-fib   Renal vascular disease   BP (high blood pressure)     Respiratory   Chronic airway obstruction - Primary     Endocrine   Diabetes mellitus, type 2      No orders of the defined types were placed in this encounter.   1. Panlobular emphysema   2. Persistent atrial fibrillation  - INR/PT  3. Atherosclerosis of native coronary artery of native heart without angina pectoris  - Ambulatory referral to Cardiology  4. Essential hypertension   5. Renal vascular disease  - furosemide (LASIX) 20 MG tablet; Take 2 tablets by mouth each morning.  Dispense: 60 tablet; Refill: 12  6. Type 2 diabetes mellitus without complication  - Comprehensive Metabolic Panel (CMET) - HgB A1c  7. Need for influenza vaccination  - Flu vaccine HIGH DOSE PF (Fluzone High dose)  8. Pneumonia due to organism  - CBC with Differential

## 2015-07-28 NOTE — Patient Instructions (Signed)
Keep appt. With Dr. Meredeth Ide tomorrow.  Get labs as directed.  Need appt. With Dr. Juliann Pares.  Cont. curent meds as directed except for increase in Furosemide.

## 2015-07-29 DIAGNOSIS — R0609 Other forms of dyspnea: Secondary | ICD-10-CM | POA: Diagnosis not present

## 2015-07-29 DIAGNOSIS — R0902 Hypoxemia: Secondary | ICD-10-CM | POA: Diagnosis not present

## 2015-07-29 DIAGNOSIS — J432 Centrilobular emphysema: Secondary | ICD-10-CM | POA: Diagnosis not present

## 2015-07-29 DIAGNOSIS — R05 Cough: Secondary | ICD-10-CM | POA: Diagnosis not present

## 2015-08-01 DIAGNOSIS — I481 Persistent atrial fibrillation: Secondary | ICD-10-CM | POA: Diagnosis not present

## 2015-08-01 DIAGNOSIS — E119 Type 2 diabetes mellitus without complications: Secondary | ICD-10-CM | POA: Diagnosis not present

## 2015-08-01 DIAGNOSIS — J189 Pneumonia, unspecified organism: Secondary | ICD-10-CM | POA: Diagnosis not present

## 2015-08-01 LAB — CBC WITH DIFFERENTIAL/PLATELET
BASOS ABS: 0 10*3/uL (ref 0.0–0.2)
Basos: 0 %
EOS (ABSOLUTE): 0.1 10*3/uL (ref 0.0–0.4)
Eos: 0 %
Hematocrit: 32.6 % — ABNORMAL LOW (ref 34.0–46.6)
Hemoglobin: 11 g/dL — ABNORMAL LOW (ref 11.1–15.9)
IMMATURE GRANS (ABS): 0 10*3/uL (ref 0.0–0.1)
Immature Granulocytes: 0 %
LYMPHS ABS: 0.8 10*3/uL (ref 0.7–3.1)
LYMPHS: 4 %
MCH: 32 pg (ref 26.6–33.0)
MCHC: 33.7 g/dL (ref 31.5–35.7)
MCV: 95 fL (ref 79–97)
MONOCYTES: 4 %
Monocytes Absolute: 0.8 10*3/uL (ref 0.1–0.9)
NEUTROS ABS: 17 10*3/uL — AB (ref 1.4–7.0)
Neutrophils: 92 %
PLATELETS: 241 10*3/uL (ref 150–379)
RBC: 3.44 x10E6/uL — AB (ref 3.77–5.28)
RDW: 14.7 % (ref 12.3–15.4)
WBC: 18.7 10*3/uL — AB (ref 3.4–10.8)

## 2015-08-01 LAB — PROTIME-INR
INR: 1.1 (ref 0.8–1.2)
PROTHROMBIN TIME: 11.1 s (ref 9.1–12.0)

## 2015-08-02 LAB — COMPREHENSIVE METABOLIC PANEL
A/G RATIO: 1.5 (ref 1.1–2.5)
ALBUMIN: 3.6 g/dL (ref 3.5–4.8)
ALT: 19 IU/L (ref 0–32)
AST: 18 IU/L (ref 0–40)
Alkaline Phosphatase: 72 IU/L (ref 39–117)
BUN / CREAT RATIO: 26 (ref 11–26)
BUN: 20 mg/dL (ref 8–27)
Bilirubin Total: 0.7 mg/dL (ref 0.0–1.2)
CALCIUM: 8.9 mg/dL (ref 8.7–10.3)
CO2: 26 mmol/L (ref 18–29)
Chloride: 96 mmol/L — ABNORMAL LOW (ref 97–108)
Creatinine, Ser: 0.76 mg/dL (ref 0.57–1.00)
GFR, EST AFRICAN AMERICAN: 92 mL/min/{1.73_m2} (ref >59)
GFR, EST NON AFRICAN AMERICAN: 80 mL/min/{1.73_m2} (ref >59)
GLOBULIN, TOTAL: 2.4 g/dL (ref 1.5–4.5)
Glucose: 153 mg/dL — ABNORMAL HIGH (ref 65–99)
POTASSIUM: 4.9 mmol/L (ref 3.5–5.2)
SODIUM: 139 mmol/L (ref 134–144)
Total Protein: 6 g/dL (ref 6.0–8.5)

## 2015-08-02 LAB — HEMOGLOBIN A1C: Hgb A1c MFr Bld: 7.2 % — ABNORMAL HIGH (ref 4.8–5.6)

## 2015-08-03 NOTE — Progress Notes (Signed)
Advised 

## 2015-08-08 ENCOUNTER — Telehealth: Payer: Self-pay | Admitting: Family Medicine

## 2015-08-08 DIAGNOSIS — I482 Chronic atrial fibrillation, unspecified: Secondary | ICD-10-CM

## 2015-08-08 DIAGNOSIS — Z7901 Long term (current) use of anticoagulants: Principal | ICD-10-CM

## 2015-08-08 DIAGNOSIS — Z5181 Encounter for therapeutic drug level monitoring: Secondary | ICD-10-CM

## 2015-08-08 NOTE — Telephone Encounter (Signed)
Pt called needing a lab order states that she was going  Wednesday to  have labs. Pt call back  # is  [phone]

## 2015-08-08 NOTE — Telephone Encounter (Signed)
Order entered

## 2015-08-10 DIAGNOSIS — R600 Localized edema: Secondary | ICD-10-CM | POA: Diagnosis not present

## 2015-08-10 DIAGNOSIS — R0902 Hypoxemia: Secondary | ICD-10-CM | POA: Diagnosis not present

## 2015-08-10 DIAGNOSIS — R0602 Shortness of breath: Secondary | ICD-10-CM | POA: Diagnosis not present

## 2015-08-10 DIAGNOSIS — I482 Chronic atrial fibrillation: Secondary | ICD-10-CM | POA: Diagnosis not present

## 2015-08-10 DIAGNOSIS — J449 Chronic obstructive pulmonary disease, unspecified: Secondary | ICD-10-CM | POA: Diagnosis not present

## 2015-08-10 DIAGNOSIS — I509 Heart failure, unspecified: Secondary | ICD-10-CM | POA: Diagnosis not present

## 2015-08-11 LAB — PROTIME-INR
INR: 1.6 — ABNORMAL HIGH (ref 0.8–1.2)
PROTHROMBIN TIME: 16.2 s — AB (ref 9.1–12.0)

## 2015-08-15 ENCOUNTER — Other Ambulatory Visit: Payer: Self-pay

## 2015-08-15 ENCOUNTER — Telehealth: Payer: Self-pay | Admitting: Family Medicine

## 2015-08-15 ENCOUNTER — Other Ambulatory Visit: Payer: Self-pay | Admitting: Family Medicine

## 2015-08-15 DIAGNOSIS — D689 Coagulation defect, unspecified: Secondary | ICD-10-CM

## 2015-08-15 MED ORDER — WARFARIN SODIUM 2 MG PO TABS: ORAL_TABLET | ORAL | Status: DC

## 2015-08-15 NOTE — Telephone Encounter (Signed)
The result is on your desk. [name]

## 2015-08-15 NOTE — Telephone Encounter (Signed)
Note about increasing Warfarin already on paperwork (in chart).

## 2015-08-15 NOTE — Telephone Encounter (Signed)
Pt called wanting results of her labs, pt also states she was out of Warfarin. Pt call back # is  [phone]

## 2015-08-16 ENCOUNTER — Other Ambulatory Visit: Payer: Self-pay

## 2015-08-16 DIAGNOSIS — D689 Coagulation defect, unspecified: Secondary | ICD-10-CM

## 2015-08-16 DIAGNOSIS — Z7901 Long term (current) use of anticoagulants: Secondary | ICD-10-CM

## 2015-08-16 DIAGNOSIS — Z5181 Encounter for therapeutic drug level monitoring: Secondary | ICD-10-CM

## 2015-08-16 NOTE — Telephone Encounter (Signed)
Advised.JH  

## 2015-08-22 DIAGNOSIS — D689 Coagulation defect, unspecified: Secondary | ICD-10-CM | POA: Diagnosis not present

## 2015-08-23 DIAGNOSIS — R0602 Shortness of breath: Secondary | ICD-10-CM | POA: Diagnosis not present

## 2015-08-23 LAB — PROTIME-INR
INR: 1.6 — AB (ref 0.8–1.2)
PROTHROMBIN TIME: 16.6 s — AB (ref 9.1–12.0)

## 2015-08-23 NOTE — Progress Notes (Signed)
Duplicate Message SEE SAME DATED MESSAGE!!!!!!!!!!!!!!!!!!!!!

## 2015-08-23 NOTE — Progress Notes (Signed)
Will take the 

## 2015-08-26 ENCOUNTER — Ambulatory Visit: Payer: Medicare Other | Admitting: Family Medicine

## 2015-08-30 ENCOUNTER — Other Ambulatory Visit: Payer: Self-pay | Admitting: Family Medicine

## 2015-09-02 ENCOUNTER — Ambulatory Visit (INDEPENDENT_AMBULATORY_CARE_PROVIDER_SITE_OTHER): Payer: Medicare Other | Admitting: Family Medicine

## 2015-09-02 ENCOUNTER — Encounter: Payer: Self-pay | Admitting: Family Medicine

## 2015-09-02 VITALS — BP 120/73 | HR 76 | Resp 16 | Ht 62.0 in | Wt 156.0 lb

## 2015-09-02 DIAGNOSIS — E785 Hyperlipidemia, unspecified: Secondary | ICD-10-CM

## 2015-09-02 DIAGNOSIS — J449 Chronic obstructive pulmonary disease, unspecified: Secondary | ICD-10-CM | POA: Diagnosis not present

## 2015-09-02 DIAGNOSIS — I10 Essential (primary) hypertension: Secondary | ICD-10-CM

## 2015-09-02 DIAGNOSIS — E119 Type 2 diabetes mellitus without complications: Secondary | ICD-10-CM

## 2015-09-02 DIAGNOSIS — I2510 Atherosclerotic heart disease of native coronary artery without angina pectoris: Secondary | ICD-10-CM | POA: Diagnosis not present

## 2015-09-02 DIAGNOSIS — Z9229 Personal history of other drug therapy: Secondary | ICD-10-CM

## 2015-09-02 DIAGNOSIS — J302 Other seasonal allergic rhinitis: Secondary | ICD-10-CM

## 2015-09-02 DIAGNOSIS — I481 Persistent atrial fibrillation: Secondary | ICD-10-CM

## 2015-09-02 DIAGNOSIS — I4819 Other persistent atrial fibrillation: Secondary | ICD-10-CM

## 2015-09-02 MED ORDER — DILTIAZEM HCL ER COATED BEADS 180 MG PO CP24: 180.0000 mg | ORAL_CAPSULE | Freq: Every day | ORAL | Status: DC

## 2015-09-02 MED ORDER — CETIRIZINE HCL 10 MG PO TABS: 10.0000 mg | ORAL_TABLET | Freq: Every day | ORAL | Status: DC

## 2015-09-02 MED ORDER — LISINOPRIL 40 MG PO TABS: 40.0000 mg | ORAL_TABLET | Freq: Every day | ORAL | Status: AC

## 2015-09-02 MED ORDER — ALBUTEROL SULFATE (2.5 MG/3ML) 0.083% IN NEBU
2.5000 mg | INHALATION_SOLUTION | Freq: Once | RESPIRATORY_TRACT | Status: AC
  Administered 2015-09-02: 2.5 mg via RESPIRATORY_TRACT

## 2015-09-02 MED ORDER — SIMVASTATIN 80 MG PO TABS: 80.0000 mg | ORAL_TABLET | Freq: Every day | ORAL | Status: DC

## 2015-09-02 NOTE — Progress Notes (Signed)
Name: [name]   MRN: [id number]    DOB: 1945-12-09   Date:09/02/2015       Progress Note  Subjective  Chief Complaint  Chief Complaint  Patient presents with  . COPD    HPI States here for f/u of COPD, but follows Dr. Raul Del for that.  Seen recently and has f/u appt. With him in 2 weeks.  Really here for f/u of HBP and Atr fib and anticoagulation, and DM.  BSs at home 100-120.  O2 was low (80) when arriving at office.  With rest,, increased to 89.  After Nebulized albuterol treatment decreased again to 86.  She is in no distress.  Has some productive cough.  No problem-specific assessment & plan notes found for this encounter.   Past Medical History  Diagnosis Date  . COPD (chronic obstructive pulmonary disease) ([place]) Jan 2014  . A-fib ([place])   . Coronary artery disease     Social History  Substance Use Topics  . Smoking status: Former Smoker -- 2.00 packs/day for 40 years    Types: Cigarettes  . Smokeless tobacco: Never Used  . Alcohol Use: No     Current outpatient prescriptions:  .  ACCU-CHEK AVIVA PLUS test strip, 1 each by Other route daily. , Disp: , Rfl:  .  albuterol (PROVENTIL) (2.5 MG/3ML) 0.083% nebulizer solution, Take 2.5 mg by nebulization every 6 (six) hours as needed for wheezing., Disp: , Rfl:  .  budesonide-formoterol (SYMBICORT) 160-4.5 MCG/ACT inhaler, Inhale 2 puffs into the lungs 2 (two) times daily. , Disp: , Rfl:  .  diltiazem (CARDIZEM CD) 180 MG 24 hr capsule, TAKE ONE (1) CAPSULE EACH DAY, Disp: 90 capsule, Rfl: 3 .  furosemide (LASIX) 40 MG tablet, Take by mouth., Disp: , Rfl:  .  lisinopril (PRINIVIL,ZESTRIL) 40 MG tablet, TAKE 1 TABLET BY MOUTH EACH DAY, Disp: 90 tablet, Rfl: 3 .  metFORMIN (GLUCOPHAGE) 500 MG tablet, Take 500 mg by mouth daily. , Disp: , Rfl:  .  metoprolol (LOPRESSOR) 100 MG tablet, Take 1 tablet (100 mg total) by mouth 2 (two) times daily., Disp: 180 tablet, Rfl: 3 .  OXYGEN-HELIUM IN, Inhale 2 L into the lungs  continuous. 2 liters of oxygen., Disp: , Rfl:  .  potassium chloride SA (K-DUR,KLOR-CON) 20 MEQ tablet, Take 20 mEq by mouth 2 (two) times daily. , Disp: , Rfl:  .  pravastatin (PRAVACHOL) 80 MG tablet, Take 80 mg by mouth daily. , Disp: , Rfl:  .  predniSONE (DELTASONE) 5 MG tablet, Take 5 mg by mouth daily., Disp: , Rfl: 2 .  simvastatin (ZOCOR) 80 MG tablet, TAKE ONE TABLET DAILY FOR CHOLESTEROL, Disp: 90 tablet, Rfl: 3 .  tiotropium (SPIRIVA) 18 MCG inhalation capsule, Place 18 mcg into inhaler and inhale daily. , Disp: , Rfl:  .  warfarin (COUMADIN) 2 MG tablet, Pt needs to take 1.5 of her 2 mg tablet. (Patient taking differently: Take 2 mg by mouth daily at 6 PM. Patient taking 2, 2 mg tablets each evening.), Disp: 45 tablet, Rfl: 0  Allergies  Allergen Reactions  . Penicillins Rash    Review of Systems  Constitutional: Negative for fever, chills, weight loss and malaise/fatigue.  Respiratory: Positive for cough, shortness of breath and wheezing.   Cardiovascular: Negative for chest pain, palpitations and leg swelling.  Gastrointestinal: Negative for heartburn, abdominal pain and blood in stool.  Genitourinary: Negative for dysuria, urgency and frequency.  Musculoskeletal: Negative for myalgias, joint pain and  falls.  Neurological: Negative for dizziness, tremors and weakness.      Objective  Filed Vitals:   09/02/15 1047 09/02/15 1133 09/02/15 1134  BP: 120/73    Pulse: 76    Resp: 16    Height: _0  (1.575 m)    Weight: 156 lb (70.761 kg)    SpO2: 82% 89% 84%     Physical Exam  Constitutional: She is oriented to person, place, and time and well-developed, well-nourished, and in no distress. No distress.  HENT:  Head: Normocephalic and atraumatic.  Eyes: Conjunctivae and EOM are normal. Pupils are equal, round, and reactive to light. No scleral icterus.  Neck: Normal range of motion. Neck supple. No thyromegaly present.  Cardiovascular: Normal rate and normal heart  sounds.  An irregularly irregular rhythm present. Exam reveals no gallop and no friction rub.   No murmur heard. Pulmonary/Chest: No respiratory distress. She has decreased breath sounds in the right upper field, the right middle field, the right lower field, the left upper field, the left middle field and the left lower field. She has no wheezes. She has no rales.  In n o distress.  Has productive cough  Abdominal: Soft. There is no tenderness.  Musculoskeletal: She exhibits no edema.  Lymphadenopathy:    She has no cervical adenopathy.  Neurological: She is alert and oriented to person, place, and time.      Recent Results (from the past 2160 hour(s))  INR/PT     Status: Abnormal   Collection Time: 06/07/15  9:55 AM  Result Value Ref Range   INR 2.6 (H) 0.8 - 1.2    Comment: Reference interval is for non-anticoagulated patients. Suggested INR therapeutic range for Vitamin K antagonist therapy:    Standard Dose (moderate intensity                   therapeutic range):       2.0 - 3.0    Higher intensity therapeutic range       2.5 - 3.5    Prothrombin Time 26.9 (H) 9.1 - 12.0 sec  Basic metabolic panel     Status: Abnormal   Collection Time: 07/17/15 11:31 AM  Result Value Ref Range   Sodium 124 (L) 135 - 145 mmol/L   Potassium 4.7 3.5 - 5.1 mmol/L   Chloride 86 (L) 101 - 111 mmol/L   CO2 29 22 - 32 mmol/L   Glucose, Bld 139 (H) 65 - 99 mg/dL   BUN 15 6 - 20 mg/dL   Creatinine, Ser 0.59 0.44 - 1.00 mg/dL   Calcium 8.4 (L) 8.9 - 10.3 mg/dL   GFR calc non Af Amer >60 >60 mL/min   GFR calc Af Amer >60 >60 mL/min    Comment: (NOTE) The eGFR has been calculated using the CKD EPI equation. This calculation has not been validated in all clinical situations. eGFR's persistently <60 mL/min signify possible Chronic Kidney Disease.    Anion gap 9 5 - 15  CBC     Status: Abnormal   Collection Time: 07/17/15 11:31 AM  Result Value Ref Range   WBC 14.2 (H) 3.6 - 11.0 K/uL   RBC  3.92 3.80 - 5.20 MIL/uL   Hemoglobin 12.2 12.0 - 16.0 g/dL   HCT 36.2 35.0 - 47.0 %   MCV 92.2 80.0 - 100.0 fL   MCH 31.2 26.0 - 34.0 pg   MCHC 33.8 32.0 - 36.0 g/dL   RDW 14.3 11.5 - 14.5 %  Platelets 274 150 - 440 K/uL  Protime-INR     Status: Abnormal   Collection Time: 07/17/15 11:42 AM  Result Value Ref Range   Prothrombin Time 33.1 (H) 11.4 - 15.0 seconds   INR 3.24   Blood culture (routine x 2)     Status: None   Collection Time: 07/17/15 12:02 PM  Result Value Ref Range   Specimen Description BLOOD LEFT FOREARM    Special Requests BOTTLES DRAWN AEROBIC AND ANAEROBIC  3CC    Culture NO GROWTH 5 DAYS    Report Status 07/22/2015 FINAL   Blood culture (routine x 2)     Status: None   Collection Time: 07/17/15 12:07 PM  Result Value Ref Range   Specimen Description BLOOD RIGHT ASSIST CONTROL    Special Requests BOTTLES DRAWN AEROBIC AND ANAEROBIC  4CC    Culture NO GROWTH 5 DAYS    Report Status 07/22/2015 FINAL   Lactic acid, plasma     Status: None   Collection Time: 07/17/15 12:45 PM  Result Value Ref Range   Lactic Acid, Venous 1.3 0.5 - 2.0 mmol/L  Lactic acid, plasma     Status: None   Collection Time: 07/17/15  5:16 PM  Result Value Ref Range   Lactic Acid, Venous 1.3 0.5 - 2.0 mmol/L  Glucose, capillary     Status: Abnormal   Collection Time: 07/17/15 10:39 PM  Result Value Ref Range   Glucose-Capillary 156 (H) 65 - 99 mg/dL   Comment 1 Notify RN   Basic metabolic panel     Status: Abnormal   Collection Time: 07/18/15  4:59 AM  Result Value Ref Range   Sodium 122 (L) 135 - 145 mmol/L   Potassium 4.9 3.5 - 5.1 mmol/L   Chloride 89 (L) 101 - 111 mmol/L   CO2 24 22 - 32 mmol/L   Glucose, Bld 153 (H) 65 - 99 mg/dL   BUN 20 6 - 20 mg/dL   Creatinine, Ser 0.53 0.44 - 1.00 mg/dL   Calcium 8.2 (L) 8.9 - 10.3 mg/dL   GFR calc non Af Amer >60 >60 mL/min   GFR calc Af Amer >60 >60 mL/min    Comment: (NOTE) The eGFR has been calculated using the CKD EPI  equation. This calculation has not been validated in all clinical situations. eGFR's persistently <60 mL/min signify possible Chronic Kidney Disease.    Anion gap 9 5 - 15  CBC     Status: Abnormal   Collection Time: 07/18/15  4:59 AM  Result Value Ref Range   WBC 11.9 (H) 3.6 - 11.0 K/uL   RBC 3.75 (L) 3.80 - 5.20 MIL/uL   Hemoglobin 12.0 12.0 - 16.0 g/dL   HCT 34.7 (L) 35.0 - 47.0 %   MCV 92.7 80.0 - 100.0 fL   MCH 32.0 26.0 - 34.0 pg   MCHC 34.5 32.0 - 36.0 g/dL   RDW 14.1 11.5 - 14.5 %   Platelets 287 150 - 440 K/uL  Protime-INR     Status: Abnormal   Collection Time: 07/18/15  4:59 AM  Result Value Ref Range   Prothrombin Time 33.5 (H) 11.4 - 15.0 seconds   INR 3.29   Glucose, capillary     Status: Abnormal   Collection Time: 07/18/15  7:48 AM  Result Value Ref Range   Glucose-Capillary 174 (H) 65 - 99 mg/dL   Comment 1 Notify RN   Glucose, capillary     Status: Abnormal   Collection  Time: 07/18/15 11:32 AM  Result Value Ref Range   Glucose-Capillary 274 (H) 65 - 99 mg/dL   Comment 1 Notify RN   Glucose, capillary     Status: Abnormal   Collection Time: 07/18/15  4:35 PM  Result Value Ref Range   Glucose-Capillary 148 (H) 65 - 99 mg/dL   Comment 1 Notify RN   Glucose, capillary     Status: Abnormal   Collection Time: 07/18/15  9:33 PM  Result Value Ref Range   Glucose-Capillary 175 (H) 65 - 99 mg/dL  Protime-INR     Status: Abnormal   Collection Time: 07/19/15  4:32 AM  Result Value Ref Range   Prothrombin Time 35.2 (H) 11.4 - 15.0 seconds   INR 2.09   Basic metabolic panel     Status: Abnormal   Collection Time: 07/19/15  4:32 AM  Result Value Ref Range   Sodium 121 (L) 135 - 145 mmol/L   Potassium 5.4 (H) 3.5 - 5.1 mmol/L   Chloride 86 (L) 101 - 111 mmol/L   CO2 29 22 - 32 mmol/L   Glucose, Bld 180 (H) 65 - 99 mg/dL   BUN 26 (H) 6 - 20 mg/dL   Creatinine, Ser 0.77 0.44 - 1.00 mg/dL   Calcium 8.1 (L) 8.9 - 10.3 mg/dL   GFR calc non Af Amer >60 >60  mL/min   GFR calc Af Amer >60 >60 mL/min    Comment: (NOTE) The eGFR has been calculated using the CKD EPI equation. This calculation has not been validated in all clinical situations. eGFR's persistently <60 mL/min signify possible Chronic Kidney Disease.    Anion gap 6 5 - 15  Glucose, capillary     Status: Abnormal   Collection Time: 07/19/15  7:31 AM  Result Value Ref Range   Glucose-Capillary 177 (H) 65 - 99 mg/dL  Glucose, capillary     Status: Abnormal   Collection Time: 07/19/15 11:29 AM  Result Value Ref Range   Glucose-Capillary 243 (H) 65 - 99 mg/dL  Sodium     Status: Abnormal   Collection Time: 07/19/15  1:48 PM  Result Value Ref Range   Sodium 117 (LL) 135 - 145 mmol/L    Comment: CRITICAL RESULT CALLED TO, READ BACK BY AND VERIFIED WITH STEVEN SYKES_0  ON 07/19/15 BY HP   Osmolality     Status: Abnormal   Collection Time: 07/19/15  1:48 PM  Result Value Ref Range   Osmolality 267 (L) 275 - 300 mOsm/kg  Sodium, urine, random     Status: None   Collection Time: 07/19/15  3:48 PM  Result Value Ref Range   Sodium, Ur <10 mmol/L  Osmolality, urine     Status: None   Collection Time: 07/19/15  3:48 PM  Result Value Ref Range   Osmolality, Ur 442 390 - 1090 mOsm/kg  Glucose, capillary     Status: Abnormal   Collection Time: 07/19/15  4:30 PM  Result Value Ref Range   Glucose-Capillary 203 (H) 65 - 99 mg/dL  Sodium     Status: Abnormal   Collection Time: 07/19/15  7:43 PM  Result Value Ref Range   Sodium 118 (LL) 135 - 145 mmol/L    Comment: CRITICAL RESULT CALLED TO, READ BACK BY AND VERIFIED WITH  DAWN SONGSTER AT 2037 07/19/15 SDR   Glucose, capillary     Status: Abnormal   Collection Time: 07/19/15  9:40 PM  Result Value Ref Range   Glucose-Capillary 194 (H)  65 - 99 mg/dL   Comment 1 Notify RN   Sodium     Status: Abnormal   Collection Time: 07/20/15  1:23 AM  Result Value Ref Range   Sodium 127 (L) 135 - 145 mmol/L  Protime-INR     Status: Abnormal    Collection Time: 07/20/15  5:20 AM  Result Value Ref Range   Prothrombin Time 30.5 (H) 11.4 - 15.0 seconds   INR 2.56   Basic metabolic panel     Status: Abnormal   Collection Time: 07/20/15  5:20 AM  Result Value Ref Range   Sodium 133 (L) 135 - 145 mmol/L   Potassium 5.8 (H) 3.5 - 5.1 mmol/L   Chloride 97 (L) 101 - 111 mmol/L   CO2 31 22 - 32 mmol/L   Glucose, Bld 199 (H) 65 - 99 mg/dL   BUN 29 (H) 6 - 20 mg/dL   Creatinine, Ser 0.74 0.44 - 1.00 mg/dL   Calcium 8.6 (L) 8.9 - 10.3 mg/dL   GFR calc non Af Amer >60 >60 mL/min   GFR calc Af Amer >60 >60 mL/min    Comment: (NOTE) The eGFR has been calculated using the CKD EPI equation. This calculation has not been validated in all clinical situations. eGFR's persistently <60 mL/min signify possible Chronic Kidney Disease.    Anion gap 5 5 - 15  CBC     Status: Abnormal   Collection Time: 07/20/15  5:20 AM  Result Value Ref Range   WBC 20.9 (H) 3.6 - 11.0 K/uL   RBC 3.61 (L) 3.80 - 5.20 MIL/uL   Hemoglobin 11.6 (L) 12.0 - 16.0 g/dL   HCT 34.1 (L) 35.0 - 47.0 %   MCV 94.3 80.0 - 100.0 fL   MCH 32.0 26.0 - 34.0 pg   MCHC 34.0 32.0 - 36.0 g/dL   RDW 14.3 11.5 - 14.5 %   Platelets 278 150 - 440 K/uL  Glucose, capillary     Status: Abnormal   Collection Time: 07/20/15  7:32 AM  Result Value Ref Range   Glucose-Capillary 189 (H) 65 - 99 mg/dL  Glucose, capillary     Status: Abnormal   Collection Time: 07/20/15 11:55 AM  Result Value Ref Range   Glucose-Capillary 283 (H) 65 - 99 mg/dL  serum sodium     Status: Abnormal   Collection Time: 07/20/15 12:43 PM  Result Value Ref Range   Sodium 133 (L) 135 - 145 mmol/L  Glucose, capillary     Status: Abnormal   Collection Time: 07/20/15  4:33 PM  Result Value Ref Range   Glucose-Capillary 212 (H) 65 - 99 mg/dL  Sodium     Status: Abnormal   Collection Time: 07/20/15  7:05 PM  Result Value Ref Range   Sodium 133 (L) 135 - 145 mmol/L  Glucose, capillary     Status: Abnormal    Collection Time: 07/20/15  9:48 PM  Result Value Ref Range   Glucose-Capillary 168 (H) 65 - 99 mg/dL   Comment 1 Notify RN   Protime-INR     Status: Abnormal   Collection Time: 07/21/15  4:59 AM  Result Value Ref Range   Prothrombin Time 29.6 (H) 11.4 - 15.0 seconds   INR 2.80   Renal function panel     Status: Abnormal   Collection Time: 07/21/15  4:59 AM  Result Value Ref Range   Sodium 133 (L) 135 - 145 mmol/L   Potassium 5.4 (H) 3.5 - 5.1 mmol/L  Chloride 93 (L) 101 - 111 mmol/L   CO2 30 22 - 32 mmol/L   Glucose, Bld 220 (H) 65 - 99 mg/dL   BUN 34 (H) 6 - 20 mg/dL   Creatinine, Ser 0.77 0.44 - 1.00 mg/dL   Calcium 8.2 (L) 8.9 - 10.3 mg/dL   Phosphorus 3.2 2.5 - 4.6 mg/dL   Albumin 2.5 (L) 3.5 - 5.0 g/dL   GFR calc non Af Amer >60 >60 mL/min   GFR calc Af Amer >60 >60 mL/min    Comment: (NOTE) The eGFR has been calculated using the CKD EPI equation. This calculation has not been validated in all clinical situations. eGFR's persistently <60 mL/min signify possible Chronic Kidney Disease.    Anion gap 10 5 - 15  Glucose, capillary     Status: Abnormal   Collection Time: 07/21/15  7:34 AM  Result Value Ref Range   Glucose-Capillary 205 (H) 65 - 99 mg/dL   Comment 1 Notify RN   Glucose, capillary     Status: Abnormal   Collection Time: 07/21/15 11:17 AM  Result Value Ref Range   Glucose-Capillary 277 (H) 65 - 99 mg/dL   Comment 1 Notify RN   Comprehensive Metabolic Panel (CMET)     Status: Abnormal   Collection Time: 08/01/15 10:44 AM  Result Value Ref Range   Glucose 153 (H) 65 - 99 mg/dL   BUN 20 8 - 27 mg/dL   Creatinine, Ser 0.76 0.57 - 1.00 mg/dL   GFR calc non Af Amer 80 >59 mL/min/1.73   GFR calc Af Amer 92 >59 mL/min/1.73   BUN/Creatinine Ratio 26 11 - 26   Sodium 139 134 - 144 mmol/L   Potassium 4.9 3.5 - 5.2 mmol/L   Chloride 96 (L) 97 - 108 mmol/L   CO2 26 18 - 29 mmol/L   Calcium 8.9 8.7 - 10.3 mg/dL   Total Protein 6.0 6.0 - 8.5 g/dL   Albumin 3.6  3.5 - 4.8 g/dL   Globulin, Total 2.4 1.5 - 4.5 g/dL   Albumin/Globulin Ratio 1.5 1.1 - 2.5   Bilirubin Total 0.7 0.0 - 1.2 mg/dL   Alkaline Phosphatase 72 39 - 117 IU/L   AST 18 0 - 40 IU/L   ALT 19 0 - 32 IU/L  CBC with Differential     Status: Abnormal   Collection Time: 08/01/15 10:44 AM  Result Value Ref Range   WBC 18.7 (H) 3.4 - 10.8 x10E3/uL   RBC 3.44 (L) 3.77 - 5.28 x10E6/uL   Hemoglobin 11.0 (L) 11.1 - 15.9 g/dL   Hematocrit 32.6 (L) 34.0 - 46.6 %   MCV 95 79 - 97 fL   MCH 32.0 26.6 - 33.0 pg   MCHC 33.7 31.5 - 35.7 g/dL   RDW 14.7 12.3 - 15.4 %   Platelets 241 150 - 379 x10E3/uL   Neutrophils 92 %   Lymphs 4 %   Monocytes 4 %   Eos 0 %   Basos 0 %   Neutrophils Absolute 17.0 (H) 1.4 - 7.0 x10E3/uL   Lymphocytes Absolute 0.8 0.7 - 3.1 x10E3/uL   Monocytes Absolute 0.8 0.1 - 0.9 x10E3/uL   EOS (ABSOLUTE) 0.1 0.0 - 0.4 x10E3/uL   Basophils Absolute 0.0 0.0 - 0.2 x10E3/uL   Immature Granulocytes 0 %   Immature Grans (Abs) 0.0 0.0 - 0.1 x10E3/uL  INR/PT     Status: None   Collection Time: 08/01/15 10:44 AM  Result Value Ref Range   INR  1.1 0.8 - 1.2    Comment: Reference interval is for non-anticoagulated patients. Suggested INR therapeutic range for Vitamin K antagonist therapy:    Standard Dose (moderate intensity                   therapeutic range):       2.0 - 3.0    Higher intensity therapeutic range       2.5 - 3.5    Prothrombin Time 11.1 9.1 - 12.0 sec  HgB A1c     Status: Abnormal   Collection Time: 08/01/15 10:44 AM  Result Value Ref Range   Hgb A1c MFr Bld 7.2 (H) 4.8 - 5.6 %  INR/PT     Status: Abnormal   Collection Time: 08/10/15 11:41 AM  Result Value Ref Range   INR 1.6 (H) 0.8 - 1.2    Comment: Reference interval is for non-anticoagulated patients. Suggested INR therapeutic range for Vitamin K antagonist therapy:    Standard Dose (moderate intensity                   therapeutic range):       2.0 - 3.0    Higher intensity therapeutic  range       2.5 - 3.5    Prothrombin Time 16.2 (H) 9.1 - 12.0 sec  INR/.diagmPT     Status: Abnormal   Collection Time: 08/22/15 10:14 AM  Result Value Ref Range   INR 1.6 (H) 0.8 - 1.2    Comment: Reference interval is for non-anticoagulated patients. Suggested INR therapeutic range for Vitamin K antagonist therapy:    Standard Dose (moderate intensity                   therapeutic range):       2.0 - 3.0    Higher intensity therapeutic range       2.5 - 3.5    Prothrombin Time 16.6 (H) 9.1 - 12.0 sec     Assessment & Plan  1. Chronic obstructive pulmonary disease, unspecified COPD type (HCC)  - albuterol (PROVENTIL) (2.5 MG/3ML) 0.083% nebulizer solution 2.5 mg; Take 3 mLs (2.5 mg total) by nebulization once.  2. Persistent atrial fibrillation ([facility])  - INR/PT  3. Essential (primary) hypertension  - diltiazem (CARDIZEM CD) 180 MG 24 hr capsule; Take 1 capsule (180 mg total) by mouth daily.  Dispense: 90 capsule; Refill: 3 - lisinopril (PRINIVIL,ZESTRIL) 40 MG tablet; Take 1 tablet (40 mg total) by mouth daily.  Dispense: 90 tablet; Refill: 3  4. Single vessel coronary artery disease   5. Type 2 diabetes mellitus without complication, without long-term current use of insulin ([place])   6. History of anticoagulant therapy  - INR/PT  7. Hyperlipidemia  - simvastatin (ZOCOR) 80 MG tablet; Take 1 tablet (80 mg total) by mouth daily at 6 PM.  Dispense: 90 tablet; Refill: 3  8. Seasonal allergies  - cetirizine (ZYRTEC) 10 MG tablet; Take 1 tablet (10 mg total) by mouth daily.  Dispense: 90 tablet; Refill: 3

## 2015-09-02 NOTE — Patient Instructions (Signed)
Cont. All current meds at current doses.  Cont. To f/u with Dr. Meredeth IdeFleming.  Use O2 as directed.

## 2015-09-05 DIAGNOSIS — Z9229 Personal history of other drug therapy: Secondary | ICD-10-CM | POA: Diagnosis not present

## 2015-09-05 DIAGNOSIS — I481 Persistent atrial fibrillation: Secondary | ICD-10-CM | POA: Diagnosis not present

## 2015-09-05 LAB — PROTIME-INR
INR: 2.5 — ABNORMAL HIGH (ref 0.8–1.2)
Prothrombin Time: 25.8 s — ABNORMAL HIGH (ref 9.1–12.0)

## 2015-09-06 ENCOUNTER — Other Ambulatory Visit: Payer: Self-pay | Admitting: Family Medicine

## 2015-09-06 DIAGNOSIS — Z7901 Long term (current) use of anticoagulants: Principal | ICD-10-CM

## 2015-09-06 DIAGNOSIS — Z5181 Encounter for therapeutic drug level monitoring: Secondary | ICD-10-CM

## 2015-09-08 DIAGNOSIS — H722X1 Other marginal perforations of tympanic membrane, right ear: Secondary | ICD-10-CM | POA: Diagnosis not present

## 2015-09-08 DIAGNOSIS — H6123 Impacted cerumen, bilateral: Secondary | ICD-10-CM | POA: Diagnosis not present

## 2015-09-08 DIAGNOSIS — H903 Sensorineural hearing loss, bilateral: Secondary | ICD-10-CM | POA: Diagnosis not present

## 2015-09-09 DIAGNOSIS — J449 Chronic obstructive pulmonary disease, unspecified: Secondary | ICD-10-CM | POA: Diagnosis not present

## 2015-09-14 DIAGNOSIS — R05 Cough: Secondary | ICD-10-CM | POA: Diagnosis not present

## 2015-09-14 DIAGNOSIS — R0609 Other forms of dyspnea: Secondary | ICD-10-CM | POA: Diagnosis not present

## 2015-09-14 DIAGNOSIS — R0902 Hypoxemia: Secondary | ICD-10-CM | POA: Diagnosis not present

## 2015-09-14 DIAGNOSIS — J432 Centrilobular emphysema: Secondary | ICD-10-CM | POA: Diagnosis not present

## 2015-09-19 ENCOUNTER — Other Ambulatory Visit: Payer: Self-pay | Admitting: Family Medicine

## 2015-09-19 DIAGNOSIS — D689 Coagulation defect, unspecified: Secondary | ICD-10-CM

## 2015-09-19 MED ORDER — WARFARIN SODIUM 2 MG PO TABS
4.0000 mg | ORAL_TABLET | Freq: Every day | ORAL | Status: DC
Start: 2015-09-19 — End: 2016-02-21

## 2015-09-19 NOTE — Telephone Encounter (Signed)
Pt needs a refill on warfarin 4 mg sent to [facility].  Her call back number is [phone]

## 2015-10-10 DIAGNOSIS — J449 Chronic obstructive pulmonary disease, unspecified: Secondary | ICD-10-CM | POA: Diagnosis not present

## 2015-10-13 ENCOUNTER — Other Ambulatory Visit: Payer: Self-pay | Admitting: Family Medicine

## 2015-10-17 ENCOUNTER — Telehealth: Payer: Self-pay | Admitting: Family Medicine

## 2015-10-17 NOTE — Telephone Encounter (Signed)
Pt called requesting  INR  ( [facility]) pt   Will  Go  Wednesday   Pt . Call back # is  [phone]

## 2015-10-19 DIAGNOSIS — Z5181 Encounter for therapeutic drug level monitoring: Secondary | ICD-10-CM | POA: Diagnosis not present

## 2015-10-19 DIAGNOSIS — Z7901 Long term (current) use of anticoagulants: Secondary | ICD-10-CM | POA: Diagnosis not present

## 2015-10-19 LAB — PROTIME-INR
INR: 4.5 — ABNORMAL HIGH (ref 0.8–1.2)
Prothrombin Time: 45.6 s — ABNORMAL HIGH (ref 9.1–12.0)

## 2015-10-19 NOTE — Telephone Encounter (Signed)
Advised per Dr Rexene EdisonH to take no Warfarin x 2 days then do one of her 2mg  tabs every evening for a week and recheck. [facility]

## 2015-10-19 NOTE — Progress Notes (Signed)
Advised 

## 2015-10-24 ENCOUNTER — Telehealth: Payer: Self-pay | Admitting: Family Medicine

## 2015-10-24 ENCOUNTER — Other Ambulatory Visit: Payer: Self-pay | Admitting: Family Medicine

## 2015-10-24 DIAGNOSIS — Z5181 Encounter for therapeutic drug level monitoring: Secondary | ICD-10-CM

## 2015-10-24 DIAGNOSIS — Z7901 Long term (current) use of anticoagulants: Principal | ICD-10-CM

## 2015-10-24 NOTE — Telephone Encounter (Signed)
Pt wants to do her labs for PT on Wednesday.

## 2015-10-26 DIAGNOSIS — Z5181 Encounter for therapeutic drug level monitoring: Secondary | ICD-10-CM | POA: Diagnosis not present

## 2015-10-26 DIAGNOSIS — Z7901 Long term (current) use of anticoagulants: Secondary | ICD-10-CM | POA: Diagnosis not present

## 2015-10-27 ENCOUNTER — Other Ambulatory Visit: Payer: Self-pay | Admitting: Family Medicine

## 2015-10-27 DIAGNOSIS — Z7901 Long term (current) use of anticoagulants: Principal | ICD-10-CM

## 2015-10-27 DIAGNOSIS — Z5181 Encounter for therapeutic drug level monitoring: Secondary | ICD-10-CM

## 2015-10-27 LAB — PROTIME-INR
INR: 1.2 (ref 0.8–1.2)
PROTHROMBIN TIME: 12.2 s — AB (ref 9.1–12.0)

## 2015-11-03 DIAGNOSIS — Z5181 Encounter for therapeutic drug level monitoring: Secondary | ICD-10-CM | POA: Diagnosis not present

## 2015-11-03 DIAGNOSIS — Z7901 Long term (current) use of anticoagulants: Secondary | ICD-10-CM | POA: Diagnosis not present

## 2015-11-03 LAB — PROTIME-INR
INR: 1.3 — AB (ref 0.8–1.2)
Prothrombin Time: 13.3 s — ABNORMAL HIGH (ref 9.1–12.0)

## 2015-11-09 DIAGNOSIS — J449 Chronic obstructive pulmonary disease, unspecified: Secondary | ICD-10-CM | POA: Diagnosis not present

## 2015-11-15 ENCOUNTER — Telehealth: Payer: Self-pay | Admitting: Family Medicine

## 2015-11-15 NOTE — Telephone Encounter (Signed)
Labs were previously ordered 10/27/15.

## 2015-11-15 NOTE — Telephone Encounter (Signed)
Pt would like to do her lab work for PT on Wednesday.  Her call back number is [phone]

## 2015-11-16 DIAGNOSIS — Z7901 Long term (current) use of anticoagulants: Secondary | ICD-10-CM | POA: Diagnosis not present

## 2015-11-16 DIAGNOSIS — Z5181 Encounter for therapeutic drug level monitoring: Secondary | ICD-10-CM | POA: Diagnosis not present

## 2015-11-16 LAB — PROTIME-INR
INR: 1.5 — ABNORMAL HIGH (ref 0.8–1.2)
PROTHROMBIN TIME: 15.3 s — AB (ref 9.1–12.0)

## 2015-11-29 ENCOUNTER — Other Ambulatory Visit: Payer: Self-pay | Admitting: Family Medicine

## 2015-11-29 DIAGNOSIS — Z7901 Long term (current) use of anticoagulants: Secondary | ICD-10-CM | POA: Diagnosis not present

## 2015-11-29 DIAGNOSIS — Z5181 Encounter for therapeutic drug level monitoring: Secondary | ICD-10-CM | POA: Diagnosis not present

## 2015-11-29 LAB — PROTIME-INR
INR: 2.1 — AB (ref 0.8–1.2)
Prothrombin Time: 21.8 s — ABNORMAL HIGH (ref 9.1–12.0)

## 2015-11-30 ENCOUNTER — Other Ambulatory Visit: Payer: Self-pay | Admitting: Family Medicine

## 2015-11-30 DIAGNOSIS — Z5181 Encounter for therapeutic drug level monitoring: Secondary | ICD-10-CM

## 2015-11-30 DIAGNOSIS — Z7901 Long term (current) use of anticoagulants: Principal | ICD-10-CM

## 2015-12-06 ENCOUNTER — Encounter: Payer: Self-pay | Admitting: Family Medicine

## 2015-12-06 ENCOUNTER — Ambulatory Visit (INDEPENDENT_AMBULATORY_CARE_PROVIDER_SITE_OTHER): Payer: Medicare Other | Admitting: Family Medicine

## 2015-12-06 VITALS — BP 132/77 | HR 80 | Temp 98.2°F | Resp 16 | Ht 62.0 in | Wt 156.4 lb

## 2015-12-06 DIAGNOSIS — J431 Panlobular emphysema: Secondary | ICD-10-CM | POA: Diagnosis not present

## 2015-12-06 DIAGNOSIS — Z9229 Personal history of other drug therapy: Secondary | ICD-10-CM | POA: Diagnosis not present

## 2015-12-06 DIAGNOSIS — I481 Persistent atrial fibrillation: Secondary | ICD-10-CM

## 2015-12-06 DIAGNOSIS — I4819 Other persistent atrial fibrillation: Secondary | ICD-10-CM

## 2015-12-06 DIAGNOSIS — I10 Essential (primary) hypertension: Secondary | ICD-10-CM | POA: Diagnosis not present

## 2015-12-06 DIAGNOSIS — E119 Type 2 diabetes mellitus without complications: Secondary | ICD-10-CM

## 2015-12-06 DIAGNOSIS — I2510 Atherosclerotic heart disease of native coronary artery without angina pectoris: Secondary | ICD-10-CM | POA: Diagnosis not present

## 2015-12-06 NOTE — Progress Notes (Signed)
Name: [name]   MRN: [id number]    DOB: 1946/05/22   Date:12/06/2015       Progress Note  Subjective  Chief Complaint  Chief Complaint  Patient presents with  . Hypertension    HPI Here for f/u of HBP and DM.  She has COPD and uses O2 continuously.  Also with atrial fib and on chronic Warfarin therapy.  Says she is feeling well overall and at her baseline  No problem-specific assessment & plan notes found for this encounter.   Past Medical History  Diagnosis Date  . COPD (chronic obstructive pulmonary disease) (HCC) Jan 2014  . A-fib (HCC)   . Coronary artery disease     Past Surgical History  Procedure Laterality Date  . Spine surgery  1999  . Joint replacement  1994, 1996, 2000    hip  . Coronary stent placement  2007  . Hip surgery    . Back surgery      Family History  Problem Relation Age of Onset  . Heart failure Father   . Stroke Mother   . Cancer Sister 68    breast    Social History   Social History  . Marital Status: Divorced    Spouse Name: N/A  . Number of Children: N/A  . Years of Education: N/A   Occupational History  . Not on file.   Social History Main Topics  . Smoking status: Former Smoker -- 2.00 packs/day for 40 years    Types: Cigarettes  . Smokeless tobacco: Never Used  . Alcohol Use: No  . Drug Use: No  . Sexual Activity: Not on file   Other Topics Concern  . Not on file   Social History Narrative     Current outpatient prescriptions:  .  ACCU-CHEK AVIVA PLUS test strip, 1 each by Other route daily. , Disp: , Rfl:  .  albuterol (PROVENTIL) (2.5 MG/3ML) 0.083% nebulizer solution, Take 2.5 mg by nebulization every 6 (six) hours as needed for wheezing., Disp: , Rfl:  .  budesonide-formoterol (SYMBICORT) 160-4.5 MCG/ACT inhaler, Inhale 2 puffs into the lungs 2 (two) times daily. , Disp: , Rfl:  .  cetirizine (ZYRTEC) 10 MG tablet, Take 1 tablet (10 mg total) by mouth daily., Disp: 90 tablet, Rfl: 3 .  diltiazem (CARDIZEM  CD) 180 MG 24 hr capsule, Take 1 capsule (180 mg total) by mouth daily., Disp: 90 capsule, Rfl: 3 .  furosemide (LASIX) 40 MG tablet, Take by mouth., Disp: , Rfl:  .  lisinopril (PRINIVIL,ZESTRIL) 40 MG tablet, Take 1 tablet (40 mg total) by mouth daily., Disp: 90 tablet, Rfl: 3 .  metFORMIN (GLUCOPHAGE) 500 MG tablet, Take 500 mg by mouth daily. , Disp: , Rfl:  .  metoprolol (LOPRESSOR) 100 MG tablet, Take 1 tablet (100 mg total) by mouth 2 (two) times daily., Disp: 180 tablet, Rfl: 3 .  OXYGEN-HELIUM IN, Inhale 2 L into the lungs continuous. 2 liters of oxygen., Disp: , Rfl:  .  potassium chloride SA (K-DUR,KLOR-CON) 20 MEQ tablet, Take 20 mEq by mouth 2 (two) times daily. , Disp: , Rfl:  .  predniSONE (DELTASONE) 5 MG tablet, Take 5 mg by mouth daily., Disp: , Rfl: 2 .  simvastatin (ZOCOR) 80 MG tablet, Take 1 tablet (80 mg total) by mouth daily at 6 PM., Disp: 90 tablet, Rfl: 3 .  tiotropium (SPIRIVA) 18 MCG inhalation capsule, Place 18 mcg into inhaler and inhale daily. , Disp: , Rfl:  .  warfarin (COUMADIN) 2 MG tablet, Take 2 tablets (4 mg total) by mouth daily at 6 PM., Disp: 45 tablet, Rfl: 6 .  ofloxacin (OCUFLOX) 0.3 % ophthalmic solution, Place 5 drops into both ears as needed., Disp: , Rfl: 0  Allergies  Allergen Reactions  . Penicillins Rash     Review of Systems  Constitutional: Negative for fever, chills, weight loss and malaise/fatigue.  HENT: Negative for hearing loss.   Eyes: Negative for blurred vision and double vision.  Respiratory: Negative for cough, shortness of breath and wheezing.   Cardiovascular: Negative for chest pain, palpitations and leg swelling.  Gastrointestinal: Negative for heartburn, abdominal pain and blood in stool.  Genitourinary: Negative for dysuria, urgency and frequency.  Musculoskeletal: Negative for myalgias and joint pain.  Skin: Negative for rash.  Neurological: Negative for dizziness, tremors, weakness and headaches.       Objective  Filed Vitals:   12/06/15 1304  BP: 132/77  Pulse: 80  Temp: 98.2 F (36.8 C)  TempSrc: Oral  Resp: 16  Height:  (1.575 m)  Weight: 156 lb 6.4 oz (70.943 kg)    Physical Exam  Constitutional: She is oriented to person, place, and time and well-developed, well-nourished, and in no distress. No distress.  HENT:  Head: Normocephalic and atraumatic.  Eyes: Conjunctivae are normal. Pupils are equal, round, and reactive to light. No scleral icterus.  Neck: Normal range of motion. Carotid bruit is not present. No thyromegaly present.  Cardiovascular: Normal rate and normal heart sounds.  Exam reveals no gallop and no friction rub.   No murmur heard. Pulmonary/Chest: Effort normal and breath sounds normal. No respiratory distress. She has no wheezes. She has no rales.  Abdominal: Soft. Bowel sounds are normal. She exhibits no distension, no abdominal bruit and no mass. There is no tenderness.  Musculoskeletal: She exhibits no edema.  Lymphadenopathy:    She has no cervical adenopathy.  Neurological: She is alert and oriented to person, place, and time.  Vitals reviewed.      Recent Results (from the past 2160 hour(s))  INR/PT     Status: Abnormal   Collection Time: 10/19/15 10:40 AM  Result Value Ref Range   INR 4.5 (H) 0.8 - 1.2    Comment: Reference interval is for non-anticoagulated patients. Suggested INR therapeutic range for Vitamin K antagonist therapy:    Standard Dose (moderate intensity                   therapeutic range):       2.0 - 3.0    Higher intensity therapeutic range       2.5 - 3.5    Prothrombin Time 45.6 (H) 9.1 - 12.0 sec  INR/PT     Status: Abnormal   Collection Time: 10/26/15 10:06 AM  Result Value Ref Range   INR 1.2 0.8 - 1.2    Comment: Reference interval is for non-anticoagulated patients. Suggested INR therapeutic range for Vitamin K antagonist therapy:    Standard Dose (moderate intensity                    therapeutic range):       2.0 - 3.0    Higher intensity therapeutic range       2.5 - 3.5    Prothrombin Time 12.2 (H) 9.1 - 12.0 sec  INR/PT     Status: Abnormal   Collection Time: 11/03/15  9:42 AM  Result Value Ref Range  INR 1.3 (H) 0.8 - 1.2    Comment: Reference interval is for non-anticoagulated patients. Suggested INR therapeutic range for Vitamin K antagonist therapy:    Standard Dose (moderate intensity                   therapeutic range):       2.0 - 3.0    Higher intensity therapeutic range       2.5 - 3.5    Prothrombin Time 13.3 (H) 9.1 - 12.0 sec  INR/PT     Status: Abnormal   Collection Time: 11/16/15  9:56 AM  Result Value Ref Range   INR 1.5 (H) 0.8 - 1.2    Comment: Reference interval is for non-anticoagulated patients. Suggested INR therapeutic range for Vitamin K antagonist therapy:    Standard Dose (moderate intensity                   therapeutic range):       2.0 - 3.0    Higher intensity therapeutic range       2.5 - 3.5    Prothrombin Time 15.3 (H) 9.1 - 12.0 sec  INR/PT     Status: Abnormal   Collection Time: 11/29/15 10:32 AM  Result Value Ref Range   INR 2.1 (H) 0.8 - 1.2    Comment: Reference interval is for non-anticoagulated patients. Suggested INR therapeutic range for Vitamin K antagonist therapy:    Standard Dose (moderate intensity                   therapeutic range):       2.0 - 3.0    Higher intensity therapeutic range       2.5 - 3.5    Prothrombin Time 21.8 (H) 9.1 - 12.0 sec     Assessment & Plan  Problem List Items Addressed This Visit      Cardiovascular and Mediastinum   Coronary atherosclerosis   A-fib (HCC)   BP (high blood pressure)     Respiratory   Chronic airway obstruction (HCC)     Endocrine   Diabetes mellitus, type 2 (HCC) - Primary   Relevant Orders   POCT HgB A1C     Other   History of anticoagulant therapy      Meds ordered this encounter  Medications  . ofloxacin (OCUFLOX) 0.3 % ophthalmic  solution    Sig: Place 5 drops into both ears as needed.    Refill:  0   1. Type 2 diabetes mellitus without complication, without long-term current use of insulin (HCC) Cont. meds - POCT HgB A1C-6.3  2. Essential hypertension Cont. meds  3. Persistent atrial fibrillation (HCC) Cont meds  4. Atherosclerosis of native coronary artery of native heart without angina pectoris Cont. meds  5. Panlobular emphysema (HCC) Cont. meds  6. History of anticoagulant therapy Cont. meds

## 2015-12-07 LAB — POCT GLYCOSYLATED HEMOGLOBIN (HGB A1C)

## 2015-12-10 DIAGNOSIS — J449 Chronic obstructive pulmonary disease, unspecified: Secondary | ICD-10-CM | POA: Diagnosis not present

## 2015-12-16 DIAGNOSIS — J439 Emphysema, unspecified: Secondary | ICD-10-CM | POA: Diagnosis not present

## 2015-12-16 DIAGNOSIS — R0902 Hypoxemia: Secondary | ICD-10-CM | POA: Diagnosis not present

## 2015-12-16 DIAGNOSIS — R0609 Other forms of dyspnea: Secondary | ICD-10-CM | POA: Diagnosis not present

## 2015-12-20 ENCOUNTER — Other Ambulatory Visit: Payer: Self-pay

## 2015-12-20 ENCOUNTER — Telehealth: Payer: Self-pay | Admitting: Family Medicine

## 2015-12-20 DIAGNOSIS — E119 Type 2 diabetes mellitus without complications: Secondary | ICD-10-CM

## 2015-12-20 MED ORDER — METFORMIN HCL 500 MG PO TABS: 500.0000 mg | ORAL_TABLET | Freq: Every day | ORAL | Status: DC

## 2015-12-20 NOTE — Telephone Encounter (Signed)
Pt needs a refill on metformin sent to [facility].  She asked for a 3 month supply.

## 2015-12-29 ENCOUNTER — Other Ambulatory Visit: Payer: Self-pay | Admitting: *Deleted

## 2015-12-29 DIAGNOSIS — Z7901 Long term (current) use of anticoagulants: Principal | ICD-10-CM

## 2015-12-29 DIAGNOSIS — Z5181 Encounter for therapeutic drug level monitoring: Secondary | ICD-10-CM

## 2015-12-30 LAB — PROTIME-INR
INR: 3.4 — ABNORMAL HIGH (ref 0.8–1.2)
Prothrombin Time: 34 s — ABNORMAL HIGH (ref 9.1–12.0)

## 2016-01-09 ENCOUNTER — Telehealth: Payer: Self-pay | Admitting: Family Medicine

## 2016-01-09 DIAGNOSIS — Z5181 Encounter for therapeutic drug level monitoring: Secondary | ICD-10-CM

## 2016-01-09 DIAGNOSIS — Z7901 Long term (current) use of anticoagulants: Principal | ICD-10-CM

## 2016-01-09 NOTE — Telephone Encounter (Signed)
Pt called requesting lab order for  [facility] on [address] [phone]

## 2016-01-09 NOTE — Telephone Encounter (Signed)
Order already in.[place]

## 2016-01-10 DIAGNOSIS — J449 Chronic obstructive pulmonary disease, unspecified: Secondary | ICD-10-CM | POA: Diagnosis not present

## 2016-01-11 DIAGNOSIS — Z5181 Encounter for therapeutic drug level monitoring: Secondary | ICD-10-CM | POA: Diagnosis not present

## 2016-01-11 DIAGNOSIS — Z7901 Long term (current) use of anticoagulants: Secondary | ICD-10-CM | POA: Diagnosis not present

## 2016-01-11 LAB — PROTIME-INR
INR: 2.3 — ABNORMAL HIGH (ref 0.8–1.2)
PROTHROMBIN TIME: 23.6 s — AB (ref 9.1–12.0)

## 2016-01-12 ENCOUNTER — Other Ambulatory Visit: Payer: Self-pay | Admitting: Family Medicine

## 2016-01-12 DIAGNOSIS — Z7901 Long term (current) use of anticoagulants: Principal | ICD-10-CM

## 2016-01-12 DIAGNOSIS — Z5181 Encounter for therapeutic drug level monitoring: Secondary | ICD-10-CM

## 2016-01-19 DIAGNOSIS — J449 Chronic obstructive pulmonary disease, unspecified: Secondary | ICD-10-CM | POA: Diagnosis not present

## 2016-01-19 DIAGNOSIS — R0902 Hypoxemia: Secondary | ICD-10-CM | POA: Diagnosis not present

## 2016-01-19 DIAGNOSIS — R600 Localized edema: Secondary | ICD-10-CM | POA: Diagnosis not present

## 2016-01-19 DIAGNOSIS — E119 Type 2 diabetes mellitus without complications: Secondary | ICD-10-CM | POA: Diagnosis not present

## 2016-01-19 DIAGNOSIS — R0602 Shortness of breath: Secondary | ICD-10-CM | POA: Diagnosis not present

## 2016-02-03 DIAGNOSIS — I701 Atherosclerosis of renal artery: Secondary | ICD-10-CM | POA: Diagnosis not present

## 2016-02-03 DIAGNOSIS — I739 Peripheral vascular disease, unspecified: Secondary | ICD-10-CM | POA: Diagnosis not present

## 2016-02-03 DIAGNOSIS — I10 Essential (primary) hypertension: Secondary | ICD-10-CM | POA: Diagnosis not present

## 2016-02-07 DIAGNOSIS — J449 Chronic obstructive pulmonary disease, unspecified: Secondary | ICD-10-CM | POA: Diagnosis not present

## 2016-02-14 ENCOUNTER — Other Ambulatory Visit: Payer: Self-pay | Admitting: *Deleted

## 2016-02-14 MED ORDER — METOPROLOL TARTRATE 100 MG PO TABS: 100.0000 mg | ORAL_TABLET | Freq: Two times a day (BID) | ORAL | Status: DC

## 2016-02-16 ENCOUNTER — Other Ambulatory Visit: Payer: Self-pay | Admitting: Family Medicine

## 2016-02-16 DIAGNOSIS — Z5181 Encounter for therapeutic drug level monitoring: Secondary | ICD-10-CM

## 2016-02-16 DIAGNOSIS — Z7901 Long term (current) use of anticoagulants: Principal | ICD-10-CM

## 2016-02-20 DIAGNOSIS — Z7901 Long term (current) use of anticoagulants: Secondary | ICD-10-CM | POA: Diagnosis not present

## 2016-02-20 DIAGNOSIS — Z5181 Encounter for therapeutic drug level monitoring: Secondary | ICD-10-CM | POA: Diagnosis not present

## 2016-02-20 LAB — PROTIME-INR
INR: 1.7 — AB (ref 0.8–1.2)
PROTHROMBIN TIME: 17.7 s — AB (ref 9.1–12.0)

## 2016-02-21 ENCOUNTER — Other Ambulatory Visit: Payer: Self-pay | Admitting: *Deleted

## 2016-02-21 DIAGNOSIS — D689 Coagulation defect, unspecified: Secondary | ICD-10-CM

## 2016-02-21 MED ORDER — WARFARIN SODIUM 2 MG PO TABS
ORAL_TABLET | ORAL | Status: DC
Start: 2016-02-21 — End: 2016-02-21

## 2016-02-21 MED ORDER — WARFARIN SODIUM 2 MG PO TABS: ORAL_TABLET | ORAL | Status: DC

## 2016-02-21 NOTE — Addendum Note (Signed)
Addended by: Alease FrameARTER, SONYA S on: 02/21/2016 10:37 AM   Modules accepted: Orders

## 2016-02-28 ENCOUNTER — Other Ambulatory Visit: Payer: Self-pay | Admitting: Family Medicine

## 2016-02-28 MED ORDER — METOPROLOL TARTRATE 100 MG PO TABS
100.0000 mg | ORAL_TABLET | Freq: Two times a day (BID) | ORAL | Status: AC
Start: 2016-02-28 — End: ?

## 2016-03-01 ENCOUNTER — Other Ambulatory Visit: Payer: Self-pay | Admitting: Family Medicine

## 2016-03-01 DIAGNOSIS — E119 Type 2 diabetes mellitus without complications: Secondary | ICD-10-CM

## 2016-03-01 DIAGNOSIS — E785 Hyperlipidemia, unspecified: Secondary | ICD-10-CM

## 2016-03-01 DIAGNOSIS — I10 Essential (primary) hypertension: Secondary | ICD-10-CM

## 2016-03-01 MED ORDER — SIMVASTATIN 80 MG PO TABS: 80.0000 mg | ORAL_TABLET | Freq: Every day | ORAL | Status: DC

## 2016-03-01 MED ORDER — METFORMIN HCL 500 MG PO TABS: 500.0000 mg | ORAL_TABLET | Freq: Every day | ORAL | Status: AC

## 2016-03-01 MED ORDER — DILTIAZEM HCL ER COATED BEADS 180 MG PO CP24: 180.0000 mg | ORAL_CAPSULE | Freq: Every day | ORAL | Status: AC

## 2016-03-05 DIAGNOSIS — H6123 Impacted cerumen, bilateral: Secondary | ICD-10-CM | POA: Diagnosis not present

## 2016-03-05 DIAGNOSIS — D689 Coagulation defect, unspecified: Secondary | ICD-10-CM | POA: Diagnosis not present

## 2016-03-05 DIAGNOSIS — H903 Sensorineural hearing loss, bilateral: Secondary | ICD-10-CM | POA: Diagnosis not present

## 2016-03-05 DIAGNOSIS — H722X1 Other marginal perforations of tympanic membrane, right ear: Secondary | ICD-10-CM | POA: Diagnosis not present

## 2016-03-06 LAB — PROTIME-INR
INR: 2.5 — ABNORMAL HIGH (ref 0.8–1.2)
Prothrombin Time: 25.5 s — ABNORMAL HIGH (ref 9.1–12.0)

## 2016-03-08 ENCOUNTER — Ambulatory Visit (INDEPENDENT_AMBULATORY_CARE_PROVIDER_SITE_OTHER): Payer: Medicare Other | Admitting: Family Medicine

## 2016-03-08 ENCOUNTER — Encounter: Payer: Self-pay | Admitting: Family Medicine

## 2016-03-08 VITALS — BP 130/75 | HR 97 | Temp 98.0°F | Resp 16 | Ht 62.0 in | Wt 154.0 lb

## 2016-03-08 DIAGNOSIS — N189 Chronic kidney disease, unspecified: Secondary | ICD-10-CM

## 2016-03-08 DIAGNOSIS — I10 Essential (primary) hypertension: Secondary | ICD-10-CM | POA: Diagnosis not present

## 2016-03-08 DIAGNOSIS — I2510 Atherosclerotic heart disease of native coronary artery without angina pectoris: Secondary | ICD-10-CM | POA: Diagnosis not present

## 2016-03-08 DIAGNOSIS — J431 Panlobular emphysema: Secondary | ICD-10-CM

## 2016-03-08 DIAGNOSIS — E119 Type 2 diabetes mellitus without complications: Secondary | ICD-10-CM | POA: Diagnosis not present

## 2016-03-08 DIAGNOSIS — D631 Anemia in chronic kidney disease: Secondary | ICD-10-CM | POA: Diagnosis not present

## 2016-03-08 DIAGNOSIS — Z7901 Long term (current) use of anticoagulants: Secondary | ICD-10-CM | POA: Diagnosis not present

## 2016-03-08 DIAGNOSIS — I4819 Other persistent atrial fibrillation: Secondary | ICD-10-CM

## 2016-03-08 DIAGNOSIS — I481 Persistent atrial fibrillation: Secondary | ICD-10-CM

## 2016-03-08 NOTE — Progress Notes (Signed)
Name: [name]   MRN: [id number]    DOB: 1946-03-17   Date:03/08/2016       Progress Note  Subjective  Chief Complaint  Chief Complaint  Patient presents with  . Diabetes    HPI  Here for f/u of DM and HBP.  Sees Pul for COPD and Card  For a.fib.  Uses O2 continuous at 3L/m.  Feeling fair.  BSs at home run 95-115 usually.   No problem-specific assessment & plan notes found for this encounter.   Past Medical History  Diagnosis Date  . COPD (chronic obstructive pulmonary disease) (HCC) Jan 2014  . A-fib (HCC)   . Coronary artery disease     Past Surgical History  Procedure Laterality Date  . Spine surgery  1999  . Joint replacement  1994, 1996, 2000    hip  . Coronary stent placement  2007  . Hip surgery    . Back surgery      Family History  Problem Relation Age of Onset  . Heart failure Father   . Stroke Mother   . Cancer Sister 39    breast    Social History   Social History  . Marital Status: Divorced    Spouse Name: N/A  . Number of Children: N/A  . Years of Education: N/A   Occupational History  . Not on file.   Social History Main Topics  . Smoking status: Former Smoker -- 2.00 packs/day for 40 years    Types: Cigarettes  . Smokeless tobacco: Never Used  . Alcohol Use: No  . Drug Use: No  . Sexual Activity: Not on file   Other Topics Concern  . Not on file   Social History Narrative     Current outpatient prescriptions:  .  ACCU-CHEK AVIVA PLUS test strip, 1 each by Other route daily. , Disp: , Rfl:  .  albuterol (PROVENTIL) (2.5 MG/3ML) 0.083% nebulizer solution, Take 2.5 mg by nebulization every 6 (six) hours as needed for wheezing., Disp: , Rfl:  .  budesonide-formoterol (SYMBICORT) 160-4.5 MCG/ACT inhaler, Inhale 2 puffs into the lungs 2 (two) times daily. , Disp: , Rfl:  .  cetirizine (ZYRTEC) 10 MG tablet, Take 1 tablet (10 mg total) by mouth daily., Disp: 90 tablet, Rfl: 3 .  diltiazem (CARDIZEM CD) 180 MG 24 hr capsule, Take 1  capsule (180 mg total) by mouth daily., Disp: 90 capsule, Rfl: 3 .  furosemide (LASIX) 40 MG tablet, Take 20 mg by mouth. , Disp: , Rfl:  .  lisinopril (PRINIVIL,ZESTRIL) 40 MG tablet, Take 1 tablet (40 mg total) by mouth daily., Disp: 90 tablet, Rfl: 3 .  metFORMIN (GLUCOPHAGE) 500 MG tablet, Take 1 tablet (500 mg total) by mouth daily., Disp: 90 tablet, Rfl: 3 .  metoprolol (LOPRESSOR) 100 MG tablet, Take 1 tablet (100 mg total) by mouth 2 (two) times daily., Disp: 180 tablet, Rfl: 3 .  ofloxacin (OCUFLOX) 0.3 % ophthalmic solution, Place 5 drops into both ears as needed., Disp: , Rfl: 0 .  OXYGEN-HELIUM IN, Inhale 2 L into the lungs continuous. 2 liters of oxygen., Disp: , Rfl:  .  potassium chloride SA (K-DUR,KLOR-CON) 20 MEQ tablet, Take 20 mEq by mouth 2 (two) times daily. , Disp: , Rfl:  .  predniSONE (DELTASONE) 5 MG tablet, Take 5 mg by mouth daily., Disp: , Rfl: 2 .  simvastatin (ZOCOR) 80 MG tablet, Take 1 tablet (80 mg total) by mouth daily at 6 PM., Disp:  90 tablet, Rfl: 3 .  tiotropium (SPIRIVA) 18 MCG inhalation capsule, Place 18 mcg into inhaler and inhale daily. , Disp: , Rfl:  .  warfarin (COUMADIN) 2 MG tablet, 2 , 2 mg tabs on Tu, Th, Sat, Sun and take 1.5 tab on M, W, F., Disp: 45 tablet, Rfl: 6  Allergies  Allergen Reactions  . Penicillins Rash     Review of Systems  Constitutional: Positive for malaise/fatigue. Negative for fever, chills and weight loss.  HENT: Negative for hearing loss.   Eyes: Negative for blurred vision and double vision.  Respiratory: Positive for cough, sputum production, shortness of breath and wheezing.   Cardiovascular: Positive for leg swelling. Negative for chest pain and palpitations.  Gastrointestinal: Negative for heartburn, abdominal pain and blood in stool.  Genitourinary: Negative for dysuria, urgency and frequency.  Skin: Negative for rash.  Neurological: Negative for dizziness, tremors, weakness and headaches.       Objective  Filed Vitals:   03/08/16 0955 03/08/16 1102 03/08/16 1113  BP: 150/85  130/75  Pulse: 97    Temp: 98 F (36.7 C)    TempSrc: Oral    Resp: 16    Height:  (1.575 m)    Weight: 154 lb (69.854 kg)    SpO2: 80% 93%     Physical Exam  Constitutional: She is oriented to person, place, and time and well-developed, well-nourished, and in no distress. No distress.  HENT:  Head: Normocephalic and atraumatic.  Eyes: Conjunctivae and EOM are normal. Pupils are equal, round, and reactive to light. No scleral icterus.  Neck: Normal range of motion. Neck supple. Carotid bruit is not present. No thyromegaly present.  Cardiovascular: Normal rate and normal heart sounds.  An irregularly irregular rhythm present. Exam reveals no gallop and no friction rub.   No murmur heard. Pulmonary/Chest: Breath sounds normal. She is in respiratory distress. She has no wheezes. She has no rales.  Uses O2 cont.  Decreased breath sounds throughout.  Musculoskeletal: She exhibits edema (1+ pedal edema bilaterally).  Lymphadenopathy:    She has no cervical adenopathy.  Neurological: She is alert and oriented to person, place, and time.  Vitals reviewed.      Recent Results (from the past 2160 hour(s))  INR/PT     Status: Abnormal   Collection Time: 12/29/15 11:11 AM  Result Value Ref Range   INR 3.4 (H) 0.8 - 1.2    Comment: Reference interval is for non-anticoagulated patients. Suggested INR therapeutic range for Vitamin K antagonist therapy:    Standard Dose (moderate intensity                   therapeutic range):       2.0 - 3.0    Higher intensity therapeutic range       2.5 - 3.5    Prothrombin Time 34.0 (H) 9.1 - 12.0 sec  INR/PT     Status: Abnormal   Collection Time: 01/11/16  9:57 AM  Result Value Ref Range   INR 2.3 (H) 0.8 - 1.2    Comment: Reference interval is for non-anticoagulated patients. Suggested INR therapeutic range for Vitamin K antagonist therapy:     Standard Dose (moderate intensity                   therapeutic range):       2.0 - 3.0    Higher intensity therapeutic range       2.5 - 3.5  Prothrombin Time 23.6 (H) 9.1 - 12.0 sec  INR/PT     Status: Abnormal   Collection Time: 02/20/16 10:17 AM  Result Value Ref Range   INR 1.7 (H) 0.8 - 1.2    Comment: Reference interval is for non-anticoagulated patients. Suggested INR therapeutic range for Vitamin K antagonist therapy:    Standard Dose (moderate intensity                   therapeutic range):       2.0 - 3.0    Higher intensity therapeutic range       2.5 - 3.5    Prothrombin Time 17.7 (H) 9.1 - 12.0 sec  INR/PT     Status: Abnormal   Collection Time: 03/05/16  2:42 PM  Result Value Ref Range   INR 2.5 (H) 0.8 - 1.2    Comment: Reference interval is for non-anticoagulated patients. Suggested INR therapeutic range for Vitamin K antagonist therapy:    Standard Dose (moderate intensity                   therapeutic range):       2.0 - 3.0    Higher intensity therapeutic range       2.5 - 3.5    Prothrombin Time 25.5 (H) 9.1 - 12.0 sec     Assessment & Plan  Problem List Items Addressed This Visit      Cardiovascular and Mediastinum   Coronary atherosclerosis   Relevant Orders   Lipid Profile   A-fib (HCC)   Essential (primary) hypertension     Respiratory   Chronic airway obstruction (HCC)     Endocrine   Diabetes mellitus, type 2 (HCC) - Primary   Relevant Orders   POCT HgB A1C   [facility] DIABETES FOOT EXAM   Comprehensive Metabolic Panel (CMET)     Other   Anemia   Relevant Orders   CBC with Differential   Encounter for current long-term use of anticoagulants      No orders of the defined types were placed in this encounter.   1. Type 2 diabetes mellitus without complication, without long-term current use of insulin (HCC) Cont med - POCT HgB A1C-6.8- [facility] DIABETES FOOT EXAM; Standing - [facility] DIABETES FOOT EXAM - Comprehensive Metabolic Panel  (CMET)  2. Essential (primary) hypertension Cont meds  3. Persistent atrial fibrillation (HCC) Cont meds  4. Panlobular emphysema (HCC) Cont meds  5. Encounter for current long-term use of anticoagulants   6. Anemia in chronic renal disease  - CBC with Differential  7. Atherosclerosis of native coronary artery of native heart without angina pectoris Cont meds - Lipid Profile

## 2016-03-09 DIAGNOSIS — J449 Chronic obstructive pulmonary disease, unspecified: Secondary | ICD-10-CM | POA: Diagnosis not present

## 2016-03-15 DIAGNOSIS — E119 Type 2 diabetes mellitus without complications: Secondary | ICD-10-CM | POA: Diagnosis not present

## 2016-03-15 DIAGNOSIS — I2510 Atherosclerotic heart disease of native coronary artery without angina pectoris: Secondary | ICD-10-CM | POA: Diagnosis not present

## 2016-03-15 DIAGNOSIS — D631 Anemia in chronic kidney disease: Secondary | ICD-10-CM | POA: Diagnosis not present

## 2016-03-15 DIAGNOSIS — N189 Chronic kidney disease, unspecified: Secondary | ICD-10-CM | POA: Diagnosis not present

## 2016-03-16 LAB — CBC WITH DIFFERENTIAL/PLATELET
BASOS: 0 %
Basophils Absolute: 0 10*3/uL (ref 0.0–0.2)
EOS (ABSOLUTE): 0.1 10*3/uL (ref 0.0–0.4)
Eos: 1 %
Hematocrit: 40.3 % (ref 34.0–46.6)
Hemoglobin: 13.7 g/dL (ref 11.1–15.9)
Immature Grans (Abs): 0 10*3/uL (ref 0.0–0.1)
Immature Granulocytes: 0 %
LYMPHS ABS: 1 10*3/uL (ref 0.7–3.1)
Lymphs: 8 %
MCH: 32.3 pg (ref 26.6–33.0)
MCHC: 34 g/dL (ref 31.5–35.7)
MCV: 95 fL (ref 79–97)
MONOS ABS: 0.6 10*3/uL (ref 0.1–0.9)
Monocytes: 5 %
NEUTROS ABS: 10.6 10*3/uL — AB (ref 1.4–7.0)
NEUTROS PCT: 86 %
PLATELETS: 272 10*3/uL (ref 150–379)
RBC: 4.24 x10E6/uL (ref 3.77–5.28)
RDW: 14.5 % (ref 12.3–15.4)
WBC: 12.4 10*3/uL — ABNORMAL HIGH (ref 3.4–10.8)

## 2016-03-16 LAB — LIPID PANEL
Chol/HDL Ratio: 2 ratio units (ref 0.0–4.4)
Cholesterol, Total: 107 mg/dL (ref 100–199)
HDL: 54 mg/dL (ref >39)
LDL Calculated: 34 mg/dL (ref 0–99)
Triglycerides: 96 mg/dL (ref 0–149)
VLDL Cholesterol Cal: 19 mg/dL (ref 5–40)

## 2016-03-16 LAB — COMPREHENSIVE METABOLIC PANEL
A/G RATIO: 1.4 (ref 1.2–2.2)
ALT: 15 IU/L (ref 0–32)
AST: 16 IU/L (ref 0–40)
Albumin: 4.1 g/dL (ref 3.5–4.8)
Alkaline Phosphatase: 68 IU/L (ref 39–117)
BUN/Creatinine Ratio: 19 (ref 12–28)
BUN: 16 mg/dL (ref 8–27)
Bilirubin Total: 0.7 mg/dL (ref 0.0–1.2)
CALCIUM: 9.3 mg/dL (ref 8.7–10.3)
CO2: 28 mmol/L (ref 18–29)
Chloride: 95 mmol/L — ABNORMAL LOW (ref 96–106)
Creatinine, Ser: 0.86 mg/dL (ref 0.57–1.00)
GFR, EST AFRICAN AMERICAN: 79 mL/min/{1.73_m2} (ref >59)
GFR, EST NON AFRICAN AMERICAN: 69 mL/min/{1.73_m2} (ref >59)
GLOBULIN, TOTAL: 2.9 g/dL (ref 1.5–4.5)
Glucose: 144 mg/dL — ABNORMAL HIGH (ref 65–99)
POTASSIUM: 4.2 mmol/L (ref 3.5–5.2)
SODIUM: 140 mmol/L (ref 134–144)
Total Protein: 7 g/dL (ref 6.0–8.5)

## 2016-03-19 LAB — POCT GLYCOSYLATED HEMOGLOBIN (HGB A1C): Hemoglobin A1C: 6.8

## 2016-03-29 DIAGNOSIS — E119 Type 2 diabetes mellitus without complications: Secondary | ICD-10-CM | POA: Diagnosis not present

## 2016-03-29 DIAGNOSIS — H401130 Primary open-angle glaucoma, bilateral, stage unspecified: Secondary | ICD-10-CM | POA: Diagnosis not present

## 2016-03-29 DIAGNOSIS — H2513 Age-related nuclear cataract, bilateral: Secondary | ICD-10-CM | POA: Diagnosis not present

## 2016-04-04 ENCOUNTER — Ambulatory Visit (INDEPENDENT_AMBULATORY_CARE_PROVIDER_SITE_OTHER): Payer: Medicare Other | Admitting: Family Medicine

## 2016-04-04 VITALS — BP 127/64 | HR 80 | Temp 98.3°F | Resp 16 | Ht 62.0 in | Wt 157.0 lb

## 2016-04-04 DIAGNOSIS — K149 Disease of tongue, unspecified: Secondary | ICD-10-CM

## 2016-04-04 DIAGNOSIS — S81802A Unspecified open wound, left lower leg, initial encounter: Secondary | ICD-10-CM

## 2016-04-04 MED ORDER — MAGIC MOUTHWASH: 5.0000 mL | Freq: Three times a day (TID) | ORAL | Status: AC | PRN

## 2016-04-04 MED ORDER — CEPHALEXIN 500 MG PO CAPS
500.0000 mg | ORAL_CAPSULE | Freq: Two times a day (BID) | ORAL | Status: DC
Start: 2016-04-04 — End: 2016-05-10

## 2016-04-04 NOTE — Patient Instructions (Addendum)
[facility] at 8am. Until that time take Keflex twice daily. Apply non-stick dressing to the wound to keep it covered. Also apply anti-biotic ointment. Go to the ER if you have severe swelling, redness, pain, or develop a fever.

## 2016-04-04 NOTE — Progress Notes (Signed)
Subjective:    Patient ID: [name], female    DOB: 09-15-1946, 70 y.o.   MRN: [id number]  HPI: [name] is a 70 y.o. female presenting on 04/04/2016 for Leg Pain   HPI  Pt presents for leg wound. Hit her leg on bed rail about 3-4 weeks ago. Wound is on L lateral side of calf. Black scabbing. Bleed some when she hit it. Some of the skin rolled up and sloughed off- described wound as very deep. Drained bloody, serous drainage. Now it's covered in black scabbing with blisters. Throbbing and painful. Getting more red around the side. Pt has a history of diabetes and CAD.    Tongue is sore- has been sore for a while. Feels like a pizza burn. Has not been eating a lot acidic food. Gets dry and feels fuzzy. Thinks it is inhaler related. Does rinse mouth after inhalers.   Past Medical History  Diagnosis Date  . COPD (chronic obstructive pulmonary disease) (HCC) Jan 2014  . A-fib (HCC)   . Coronary artery disease     Current Outpatient Prescriptions on File Prior to Visit  Medication Sig  . ACCU-CHEK AVIVA PLUS test strip 1 each by Other route daily.   Marland Kitchen albuterol (PROVENTIL) (2.5 MG/3ML) 0.083% nebulizer solution Take 2.5 mg by nebulization every 6 (six) hours as needed for wheezing.  . budesonide-formoterol (SYMBICORT) 160-4.5 MCG/ACT inhaler Inhale 2 puffs into the lungs 2 (two) times daily.   . cetirizine (ZYRTEC) 10 MG tablet Take 1 tablet (10 mg total) by mouth daily.  Marland Kitchen diltiazem (CARDIZEM CD) 180 MG 24 hr capsule Take 1 capsule (180 mg total) by mouth daily.  . furosemide (LASIX) 40 MG tablet Take 20 mg by mouth.   Marland Kitchen lisinopril (PRINIVIL,ZESTRIL) 40 MG tablet Take 1 tablet (40 mg total) by mouth daily.  . metFORMIN (GLUCOPHAGE) 500 MG tablet Take 1 tablet (500 mg total) by mouth daily.  . metoprolol (LOPRESSOR) 100 MG tablet Take 1 tablet (100 mg total) by mouth 2 (two) times daily.  Marland Kitchen ofloxacin (OCUFLOX) 0.3 % ophthalmic solution Place 5 drops into both ears as needed.  .  OXYGEN-HELIUM IN Inhale 2 L into the lungs continuous. 2 liters of oxygen.  . potassium chloride SA (K-DUR,KLOR-CON) 20 MEQ tablet Take 20 mEq by mouth 2 (two) times daily.   . predniSONE (DELTASONE) 5 MG tablet Take 5 mg by mouth daily.  . simvastatin (ZOCOR) 80 MG tablet Take 1 tablet (80 mg total) by mouth daily at 6 PM.  . tiotropium (SPIRIVA) 18 MCG inhalation capsule Place 18 mcg into inhaler and inhale daily.   Marland Kitchen warfarin (COUMADIN) 2 MG tablet 2 , 2 mg tabs on Tu, Th, Sat, Sun and take 1.5 tab on M, W, F.   No current facility-administered medications on file prior to visit.    Review of Systems  Constitutional: Negative for fever and chills.  HENT: Negative for drooling and trouble swallowing.        Tongue irritation  Respiratory: Positive for shortness of breath (baseline- severe COPD). Negative for chest tightness and wheezing.   Cardiovascular: Negative for chest pain, palpitations and leg swelling.  Gastrointestinal: Negative for nausea and vomiting.  Skin: Positive for color change and wound.  Neurological: Negative for dizziness, syncope and numbness.   Per HPI unless specifically indicated above     Objective:    BP 127/64 mmHg  Pulse 80  Temp(Src) 98.3 F (36.8 C) (Oral)  Resp 16  Ht  (1.575 m)  Wt 157 lb (71.215 kg)  BMI 28.71 kg/m2  SpO2 90%  Wt Readings from Last 3 Encounters:  04/04/16 157 lb (71.215 kg)  03/08/16 154 lb (69.854 kg)  12/06/15 156 lb 6.4 oz (70.943 kg)    Physical Exam  Constitutional: She is oriented to person, place, and time. She appears well-developed and well-nourished.  HENT:  Head: Normocephalic and atraumatic.  Mouth/Throat: Uvula is midline and oropharynx is clear and moist.    Neck: Normal range of motion. Neck supple.  Cardiovascular: Normal rate.  Exam reveals no gallop and no friction rub.   No murmur heard. Pulmonary/Chest: Effort normal. No respiratory distress. She has no rales.  Neurological: She is alert  and oriented to person, place, and time.  Skin: Skin is warm and dry. No rash noted. She is not diaphoretic. There is erythema. No pallor.      Results for orders placed or performed in visit on 03/08/16  Comprehensive Metabolic Panel (CMET)  Result Value Ref Range   Glucose 144 (H) 65 - 99 mg/dL   BUN 16 8 - 27 mg/dL   Creatinine, Ser 1.61 0.57 - 1.00 mg/dL   GFR calc non Af Amer 69 >59 mL/min/1.73   GFR calc Af Amer 79 >59 mL/min/1.73   BUN/Creatinine Ratio 19 12 - 28   Sodium 140 134 - 144 mmol/L   Potassium 4.2 3.5 - 5.2 mmol/L   Chloride 95 (L) 96 - 106 mmol/L   CO2 28 18 - 29 mmol/L   Calcium 9.3 8.7 - 10.3 mg/dL   Total Protein 7.0 6.0 - 8.5 g/dL   Albumin 4.1 3.5 - 4.8 g/dL   Globulin, Total 2.9 1.5 - 4.5 g/dL   Albumin/Globulin Ratio 1.4 1.2 - 2.2   Bilirubin Total 0.7 0.0 - 1.2 mg/dL   Alkaline Phosphatase 68 39 - 117 IU/L   AST 16 0 - 40 IU/L   ALT 15 0 - 32 IU/L  CBC with Differential  Result Value Ref Range   WBC 12.4 (H) 3.4 - 10.8 x10E3/uL   RBC 4.24 3.77 - 5.28 x10E6/uL   Hemoglobin 13.7 11.1 - 15.9 g/dL   Hematocrit 09.6 04.5 - 46.6 %   MCV 95 79 - 97 fL   MCH 32.3 26.6 - 33.0 pg   MCHC 34.0 31.5 - 35.7 g/dL   RDW 40.9 81.1 - 91.4 %   Platelets 272 150 - 379 x10E3/uL   Neutrophils 86 %   Lymphs 8 %   Monocytes 5 %   Eos 1 %   Basos 0 %   Neutrophils Absolute 10.6 (H) 1.4 - 7.0 x10E3/uL   Lymphocytes Absolute 1.0 0.7 - 3.1 x10E3/uL   Monocytes Absolute 0.6 0.1 - 0.9 x10E3/uL   EOS (ABSOLUTE) 0.1 0.0 - 0.4 x10E3/uL   Basophils Absolute 0.0 0.0 - 0.2 x10E3/uL   Immature Granulocytes 0 %   Immature Grans (Abs) 0.0 0.0 - 0.1 x10E3/uL  Lipid Profile  Result Value Ref Range   Cholesterol, Total 107 100 - 199 mg/dL   Triglycerides 96 0 - 149 mg/dL   HDL 54 >78 mg/dL   VLDL Cholesterol Cal 19 5 - 40 mg/dL   LDL Calculated 34 0 - 99 mg/dL   Chol/HDL Ratio 2.0 0.0 - 4.4 ratio units  POCT HgB A1C  Result Value Ref Range   Hemoglobin A1C 6.8%         Assessment & Plan:   Problem List Items Addressed  This Visit    None    Visit Diagnoses    Tongue irritation    -  Primary    Trial of magic mouthwash to help with symptoms. Encouraged adequate hydration daily. Return if not improving.     Relevant Medications    magic mouthwash SOLN    Non-healing wound of lower extremity, left, initial encounter        Refer to wound care due to DM and non-healing. Wound likely needs to be debrided. Non-stick dressing, keflex for possible wound infection. Alarm symptoms of sweelling, fever, purulent drainage or other concerning symptoms reviewed. Pt to ER if present. Appt for woundcare on 5/30.     Relevant Orders    Ambulatory referral to Wound Clinic       Meds ordered this encounter  Medications  . magic mouthwash SOLN    Sig: Take 5 mLs by mouth 3 (three) times daily as needed for mouth pain.    Dispense:  100 mL    Refill:  0    Order Specific Question:  Supervising Provider    Answer:  Janeann ForehandHAWKINS JR, JAMES H [[id number]]  . cephALEXin (KEFLEX) 500 MG capsule    Sig: Take 1 capsule (500 mg total) by mouth 2 (two) times daily.    Dispense:  14 capsule    Refill:  0    Order Specific Question:  Supervising Provider    Answer:  Janeann ForehandHAWKINS JR, JAMES H [[id number]]      Follow up plan: Return if symptoms worsen or fail to improve, for Please follow-up with wound center.

## 2016-04-08 DIAGNOSIS — J449 Chronic obstructive pulmonary disease, unspecified: Secondary | ICD-10-CM | POA: Diagnosis not present

## 2016-04-10 ENCOUNTER — Encounter: Payer: Medicare Other | Attending: Internal Medicine | Admitting: Internal Medicine

## 2016-04-10 DIAGNOSIS — S81802A Unspecified open wound, left lower leg, initial encounter: Secondary | ICD-10-CM | POA: Diagnosis not present

## 2016-04-10 DIAGNOSIS — J449 Chronic obstructive pulmonary disease, unspecified: Secondary | ICD-10-CM | POA: Insufficient documentation

## 2016-04-10 DIAGNOSIS — E11622 Type 2 diabetes mellitus with other skin ulcer: Secondary | ICD-10-CM | POA: Diagnosis not present

## 2016-04-10 DIAGNOSIS — Z87891 Personal history of nicotine dependence: Secondary | ICD-10-CM | POA: Diagnosis not present

## 2016-04-10 DIAGNOSIS — X58XXXD Exposure to other specified factors, subsequent encounter: Secondary | ICD-10-CM | POA: Diagnosis not present

## 2016-04-10 DIAGNOSIS — S81832D Puncture wound without foreign body, left lower leg, subsequent encounter: Secondary | ICD-10-CM | POA: Diagnosis not present

## 2016-04-10 DIAGNOSIS — J45909 Unspecified asthma, uncomplicated: Secondary | ICD-10-CM | POA: Insufficient documentation

## 2016-04-10 DIAGNOSIS — I10 Essential (primary) hypertension: Secondary | ICD-10-CM | POA: Diagnosis not present

## 2016-04-10 DIAGNOSIS — I739 Peripheral vascular disease, unspecified: Secondary | ICD-10-CM | POA: Diagnosis not present

## 2016-04-10 DIAGNOSIS — Z9981 Dependence on supplemental oxygen: Secondary | ICD-10-CM | POA: Insufficient documentation

## 2016-04-10 DIAGNOSIS — H9190 Unspecified hearing loss, unspecified ear: Secondary | ICD-10-CM | POA: Insufficient documentation

## 2016-04-10 DIAGNOSIS — E1151 Type 2 diabetes mellitus with diabetic peripheral angiopathy without gangrene: Secondary | ICD-10-CM | POA: Diagnosis not present

## 2016-04-10 NOTE — Progress Notes (Signed)
[name], [name] ([id number]) Visit Report for 04/10/2016 Abuse/Suicide Risk Screen Details Patient Name: [name], [name] Date of Service: 04/10/2016 8:00 AM Medical Record Patient Account Number: [id number] Number: Treating RN: Clover MealyAfful, RN, BSN, Rita 1946/01/16 [phone](70 y.o. Other Clinician: Date of Birth/Sex: Female) Treating ROBSON, MICHAEL Primary Care Physician/Extender: Comer LocketG HAWKINS JR, JAMES Physician: Referring Physician: Jones BroomHAWKINS JR, JAMES Weeks in Treatment: 0 Abuse/Suicide Risk Screen Items Answer ABUSE/SUICIDE RISK SCREEN: Has anyone close to you tried to hurt or harm you recentlyo No Do you feel uncomfortable with anyone in your familyo No Has anyone forced you do things that you didnot want to doo No Do you have any thoughts of harming yourselfo No Patient displays signs or symptoms of abuse and/or neglect. No Electronic Signature(s) Signed: 04/10/2016 3:28:31 PM By: Elpidio EricAfful, Rita BSN, RN Entered By: Elpidio EricAfful, Rita on 04/10/2016 08:29:04 Swint, Meggan H. ([id number]) -------------------------------------------------------------------------------- Activities of Daily Living Details Patient Name: [name], [name] Date of Service: 04/10/2016 8:00 AM Medical Record Patient Account Number: [id number] Number: Treating RN: Clover MealyAfful, RN, BSN, Rita 1946/01/16 [phone](70 y.o. Other Clinician: Date of Birth/Sex: Female) Treating ROBSON, MICHAEL Primary Care Physician/Extender: Comer LocketG HAWKINS JR, JAMES Physician: Referring Physician: Jones BroomHAWKINS JR, JAMES Weeks in Treatment: 0 Activities of Daily Living Items Answer Activities of Daily Living (Please select one for each item) Drive Automobile Need Assistance Take Medications Completely Able Use Telephone Completely Able Care for Appearance Completely Able Use Toilet Completely Able Bath / Shower Completely Able Dress Self Completely Able Feed Self Completely Able Walk Completely Able Get In / Out Bed Completely Able Housework Completely  Able Prepare Meals Completely Able Handle Money Completely Able Shop for Self Completely Able Electronic Signature(s) Signed: 04/10/2016 3:28:31 PM By: Elpidio EricAfful, Rita BSN, RN Entered By: Elpidio EricAfful, Rita on 04/10/2016 08:29:28 Abdullah, Ashaunte H. ([id number]) -------------------------------------------------------------------------------- Education Assessment Details Patient Name: [name], [name] Date of Service: 04/10/2016 8:00 AM Medical Record Patient Account Number: [id number] Number: Treating RN: Clover MealyAfful, RN, BSN, Rita 1946/01/16 [phone](70 y.o. Other Clinician: Date of Birth/Sex: Female) Treating ROBSON, MICHAEL Primary Care Physician/Extender: Comer LocketG HAWKINS JR, JAMES Physician: Referring Physician: Jones BroomHAWKINS JR, JAMES Weeks in Treatment: 0 Primary Learner Assessed: Patient Learning Preferences/Education Level/Primary Language Learning Preference: Explanation Highest Education Level: Grade School Preferred Language: English Cognitive Barrier Assessment/Beliefs Language Barrier: No Physical Barrier Assessment Impaired Vision: Yes Glasses Impaired Hearing: Yes Decreased Hand dexterity: No Knowledge/Comprehension Assessment Knowledge Level: High Comprehension Level: High Ability to understand written High instructions: Ability to understand verbal High instructions: Motivation Assessment Anxiety Level: Calm Cooperation: Cooperative Education Importance: Acknowledges Need Interest in Health Problems: Asks Questions Perception: Coherent Willingness to Engage in Self- High Management Activities: Readiness to Engage in Self- High Management Activities: Electronic Signature(s) Signed: 04/10/2016 3:28:31 PM By: Elpidio EricAfful, Rita BSN, RN Julin, Aydan H. ([id number]) Entered By: Elpidio EricAfful, Rita on 04/10/2016 08:29:54 Straus, Karysa H. ([id number]) -------------------------------------------------------------------------------- Fall Risk Assessment Details Patient Name: Ord, [name] Date of Service:  04/10/2016 8:00 AM Medical Record Patient Account Number: [id number] Number: Treating RN: Clover MealyAfful, RN, BSN, Rita 1946/01/16 [phone](70 y.o. Other Clinician: Date of Birth/Sex: Female) Treating ROBSON, MICHAEL Primary Care Physician/Extender: Comer LocketG HAWKINS JR, JAMES Physician: Referring Physician: Jones BroomHAWKINS JR, JAMES Weeks in Treatment: 0 Fall Risk Assessment Items Have you had 2 or more falls in the last 12 monthso 0 No Have you had any fall that resulted in injury in the last 12 monthso 0 No FALL RISK ASSESSMENT: History of falling - immediate or within 3 months 0 No Secondary diagnosis 0 No  Ambulatory aid None/bed rest/wheelchair/nurse 0 Yes Crutches/cane/walker 0 No Furniture 0 No IV Access/Saline Lock 0 No Gait/Training Normal/bed rest/immobile 0 Yes Weak 0 No Impaired 0 No Mental Status Oriented to own ability 0 Yes Electronic Signature(s) Signed: 04/10/2016 3:28:31 PM By: Elpidio Eric BSN, RN Entered By: Elpidio Eric on 04/10/2016 08:30:04 Chamblin, Eilene H. ([id number]) -------------------------------------------------------------------------------- Foot Assessment Details Patient Name: Morioka, [name] Date of Service: 04/10/2016 8:00 AM Medical Record Patient Account Number: [id number] Number: Treating RN: Clover Mealy, RN, BSN, Rita Aug 09, 1946 [phone] y.o. Other Clinician: Date of Birth/Sex: Female) Treating ROBSON, MICHAEL Primary Care Physician/Extender: Comer Locket Physician: Referring Physician: Jones Broom in Treatment: 0 Foot Assessment Items Site Locations + = Sensation present, - = Sensation absent, C = Callus, U = Ulcer R = Redness, W = Warmth, M = Maceration, PU = Pre-ulcerative lesion F = Fissure, S = Swelling, D = Dryness Assessment Right: Left: Other Deformity: No No Prior Foot Ulcer: No No Prior Amputation: No No Charcot Joint: No No Ambulatory Status: Ambulatory Without Help Gait: Steady Electronic Signature(s) Signed: 04/10/2016  3:28:31 PM By: Elpidio Eric BSN, RN Entered By: Elpidio Eric on 04/10/2016 08:30:21 Facemire, Kadeshia H. ([id number]) Reade, Teran H. ([id number]) -------------------------------------------------------------------------------- Nutrition Risk Assessment Details Patient Name: Georgiou, Zoey H. Date of Service: 04/10/2016 8:00 AM Medical Record Patient Account Number: [id number] Number: Treating RN: Clover Mealy, RN, BSN, Rita 1946-04-09 [phone] y.o. Other Clinician: Date of Birth/Sex: Female) Treating ROBSON, MICHAEL Primary Care Physician/Extender: Comer Locket Physician: Referring Physician: Jones Broom in Treatment: 0 Height (in): 62 Weight (lbs): 157 Body Mass Index (BMI): 28.7 Nutrition Risk Assessment Items NUTRITION RISK SCREEN: I have an illness or condition that made me change the kind and/or 0 No amount of food I eat I eat fewer than two meals per day 0 No I eat few fruits and vegetables, or milk products 0 No I have three or more drinks of beer, liquor or wine almost every day 0 No I have tooth or mouth problems that make it hard for me to eat 0 No I don't always have enough money to buy the food I need 0 No I eat alone most of the time 0 No I take three or more different prescribed or over-the-counter drugs a 0 No day Without wanting to, I have lost or gained 10 pounds in the last six 0 No months I am not always physically able to shop, cook and/or feed myself 0 No Nutrition Protocols Good Risk Protocol 0 No interventions needed Moderate Risk Protocol Electronic Signature(s) Signed: 04/10/2016 3:28:31 PM By: Elpidio Eric BSN, RN Entered By: Elpidio Eric on 04/10/2016 08:30:14

## 2016-04-12 NOTE — Progress Notes (Signed)
SHARNA, GABRYS ([id number]) Visit Report for 04/10/2016 Allergy List Details Patient Name: [name], [name] Date of Service: 04/10/2016 8:00 AM Medical Record Patient Account Number: [id number] Number: Treating RN: Clover Mealy, RN, BSN, Rita 09-26-1946 [phone] y.o. Other Clinician: Date of Birth/Sex: Female) Treating ROBSON, MICHAEL Primary Care Physician: Fidel Levy Physician/Extender: G Referring Physician: Fidel Levy Weeks in Treatment: 0 Allergies Active Allergies PCN Allergy Notes Electronic Signature(s) Signed: 04/10/2016 3:28:31 PM By: Elpidio Eric BSN, RN Entered By: Elpidio Eric on 04/10/2016 08:27:40 [name], [name] ([id number]) -------------------------------------------------------------------------------- Arrival Information Details Patient Name: [name], [name] Date of Service: 04/10/2016 8:00 AM Medical Record Patient Account Number: [id number] Number: Treating RN: Clover Mealy, RN, BSN, Rita 06/06/1946 [phone] y.o. Other Clinician: Date of Birth/Sex: Female) Treating ROBSON, MICHAEL Primary Care Physician: Fidel Levy Physician/Extender: G Referring Physician: Jones Broom in Treatment: 0 Visit Information Patient Arrived: Ambulatory Arrival Time: 08:14 Accompanied By: sister Transfer Assistance: None Patient Identification Verified: Yes Secondary Verification Process Yes Completed: Patient Requires Transmission- No Based Precautions: Patient Has Alerts: Yes Patient Alerts: Patient on Blood Thinner Warfarin Electronic Signature(s) Signed: 04/10/2016 3:28:31 PM By: Elpidio Eric BSN, RN Entered By: Elpidio Eric on 04/10/2016 09:25:29 [name], [name] ([id number]) -------------------------------------------------------------------------------- Clinic Level of Care Assessment Details Patient Name: [name], [name] Date of Service: 04/10/2016 8:00 AM Medical Record Patient Account Number: [id number] Number: Treating RN: Clover Mealy, RN,  BSN, Rita Nov 06, 1946 [phone] y.o. Other Clinician: Date of Birth/Sex: Female) Treating ROBSON, MICHAEL Primary Care Physician: Fidel Levy Physician/Extender: G Referring Physician: Jones Broom in Treatment: 0 Clinic Level of Care Assessment Items TOOL 1 Quantity Score []  - Use when EandM and Procedure is performed on INITIAL visit 0 ASSESSMENTS - Nursing Assessment / Reassessment X - General Physical Exam (combine w/ comprehensive assessment (listed just 1 20 below) when performed on new pt. evals) X - Comprehensive Assessment (HX, ROS, Risk Assessments, Wounds Hx, etc.) 1 25 ASSESSMENTS - Wound and Skin Assessment / Reassessment []  - Dermatologic / Skin Assessment (not related to wound area) 0 ASSESSMENTS - Ostomy and/or Continence Assessment and Care []  - Incontinence Assessment and Management 0 []  - Ostomy Care Assessment and Management (repouching, etc.) 0 PROCESS - Coordination of Care X - Simple Patient / Family Education for ongoing care 1 15 []  - Complex (extensive) Patient / Family Education for ongoing care 0 X - Staff obtains Consents, Records, Test Results / Process Orders 1 10 []  - Staff telephones HHA, Nursing Homes / Clarify orders / etc 0 []  - Routine Transfer to another Facility (non-emergent condition) 0 []  - Routine Hospital Admission (non-emergent condition) 0 X - New Admissions / Manufacturing engineer / Ordering NPWT, Apligraf, etc. 1 15 []  - Emergency Hospital Admission (emergent condition) 0 PROCESS - Special Needs []  - Pediatric / Minor Patient Management 0 [name], [name] ([id number]) []  - Isolation Patient Management 0 []  - Hearing / Language / Visual special needs 0 []  - Assessment of Community assistance (transportation, D/C planning, etc.) 0 []  - Additional assistance / Altered mentation 0 []  - Support Surface(s) Assessment (bed, cushion, seat, etc.) 0 INTERVENTIONS - Miscellaneous []  - External ear exam 0 []  - Patient Transfer  (multiple staff / Nurse, adult / Similar devices) 0 []  - Simple Staple / Suture removal (25 or less) 0 []  - Complex Staple / Suture removal (26 or more) 0 []  - Hypo/Hyperglycemic Management (do not check if billed separately) 0 X - Ankle / Brachial Index (  ABI) - do not check if billed separately 1 15 Has the patient been seen at the hospital within the last three years: Yes Total Score: 100 Level Of Care: New/Established - Level 3 Electronic Signature(s) Signed: 04/10/2016 3:28:31 PM By: Elpidio Eric BSN, RN Entered By: Elpidio Eric on 04/10/2016 08:47:20 [name], [name] ([id number]) -------------------------------------------------------------------------------- Encounter Discharge Information Details Patient Name: [name], [name] Date of Service: 04/10/2016 8:00 AM Medical Record Patient Account Number: [id number] Number: Treating RN: Clover Mealy, RN, BSN, Rita 03/25/1946 [phone] y.o. Other Clinician: Date of Birth/Sex: Female) Treating ROBSON, MICHAEL Primary Care Physician: Fidel Levy Physician/Extender: G Referring Physician: Jones Broom in Treatment: 0 Encounter Discharge Information Items Discharge Pain Level: 0 Discharge Condition: Stable Ambulatory Status: Wheelchair Discharge Destination: Home Transportation: Private Auto Accompanied By: sister Schedule Follow-up Appointment: No Medication Reconciliation completed and provided to Patient/Care No [name] Scinto: Provided on Clinical Summary of Care: 04/10/2016 Form Type Recipient Paper Patient IA Electronic Signature(s) Signed: 04/10/2016 9:02:56 AM By: Gwenlyn Perking Entered By: Gwenlyn Perking on 04/10/2016 09:02:56 [name], [name] ([id number]) -------------------------------------------------------------------------------- Lower Extremity Assessment Details Patient Name: [name], [name] Date of Service: 04/10/2016 8:00 AM Medical Record Patient Account Number: [id number] Number: Treating RN: Clover Mealy, RN,  BSN, Rita 06-18-1946 [phone] y.o. Other Clinician: Date of Birth/Sex: Female) Treating ROBSON, MICHAEL Primary Care Physician: Fidel Levy Physician/Extender: G Referring Physician: Fidel Levy Weeks in Treatment: 0 Edema Assessment Assessed: [Left: No] [Right: No] E[Left: dema] [Right: :] Calf Left: Right: Point of Measurement: 30 cm From Medial Instep 32 cm 33 cm Ankle Left: Right: Point of Measurement: 9 cm From Medial Instep 20.5 cm 21 cm Vascular Assessment Claudication: Claudication Assessment [Left:None] [Right:None] Pulses: Posterior Tibial Palpable: [Left:Yes] [Right:Yes] Doppler: [Left:Monophasic] [Right:Monophasic] Dorsalis Pedis Palpable: [Left:Yes] [Right:Yes] Doppler: [Left:Monophasic] [Right:Monophasic] Extremity colors, hair growth, and conditions: Extremity Color: [Left:Normal] [Right:Normal] Hair Growth on Extremity: [Left:No] [Right:No] Temperature of Extremity: [Left:Warm] [Right:Warm] Capillary Refill: [Left:< 3 seconds] [Right:< 3 seconds] Blood Pressure: Brachial: [Left:138] [Right:136] Dorsalis Pedis: 124 [Left:Dorsalis Pedis: 120] Ankle: Posterior Tibial: 123 [Left:Posterior Tibial: 124 0.90] [Right:0.90] Toe Nail Assessment Left: Right: Gilman, Jamiyla H. ([id number]) Thick: Yes Yes Discolored: Yes Yes Deformed: No No Improper Length and Hygiene: Yes No Electronic Signature(s) Signed: 04/10/2016 3:28:31 PM By: Elpidio Eric BSN, RN Entered By: Elpidio Eric on 04/10/2016 08:27:30 Shams, Graelyn H. ([id number]) -------------------------------------------------------------------------------- Multi Wound Chart Details Patient Name: [name], [name] Date of Service: 04/10/2016 8:00 AM Medical Record Patient Account Number: [id number] Number: Treating RN: Clover Mealy, RN, BSN, Rita 07-27-1946 [phone] y.o. Other Clinician: Date of Birth/Sex: Female) Treating ROBSON, MICHAEL Primary Care Physician: Fidel Levy Physician/Extender: G Referring  Physician: Fidel Levy Weeks in Treatment: 0 Vital Signs Height(in): 62 Pulse(bpm): 87 Weight(lbs): 157 Blood Pressure 138/119 (mmHg): Body Mass Index(BMI): 29 Temperature(F): 97.8 Respiratory Rate 20 (breaths/min): Photos: [1:No Photos] [N/A:N/A] Wound Location: [1:Left Lower Leg - Lateral] [N/A:N/A] Wounding Event: [1:Trauma] [N/A:N/A] Primary Etiology: [1:Skin Tear] [N/A:N/A] Comorbid History: [1:Cataracts, Asthma, Chronic Obstructive Pulmonary Disease (COPD), Arrhythmia, Hypertension, Peripheral Arterial Disease, Peripheral Venous Disease, Type II Diabetes] [N/A:N/A] Date Acquired: [1:02/22/2016] [N/A:N/A] Weeks of Treatment: [1:0] [N/A:N/A] Wound Status: [1:Open] [N/A:N/A] Measurements L x W x D 4x2x0.1 [N/A:N/A] (cm) Area (cm) : [1:6.283] [N/A:N/A] Volume (cm) : [1:0.628] [N/A:N/A] Classification: [1:Partial Thickness] [N/A:N/A] HBO Classification: [1:Grade 1] [N/A:N/A] Exudate Amount: [1:Medium] [N/A:N/A] Exudate Type: [1:Serosanguineous] [N/A:N/A] Exudate Color: [1:red, brown] [N/A:N/A] Wound Margin: [1:Distinct, outline attached] [N/A:N/A] Granulation Amount: [1:Small (1-33%)] [N/A:N/A] Necrotic Amount: [1:Large (67-100%)] [N/A:N/A] Exposed Structures: [  N/A:N/A] Fascia: No Fat: No Tendon: No Muscle: No Joint: No Bone: No Limited to Skin Breakdown Epithelialization: None N/A N/A Periwound Skin Texture: Edema: Yes N/A N/A Excoriation: No Induration: No Callus: No Crepitus: No Fluctuance: No Friable: No Rash: No Scarring: No Periwound Skin Moist: Yes N/A N/A Moisture: Maceration: No Dry/Scaly: No Periwound Skin Color: Atrophie Blanche: No N/A N/A Cyanosis: No Ecchymosis: No Erythema: No Hemosiderin Staining: No Mottled: No Pallor: No Rubor: No Temperature: No Abnormality N/A N/A Tenderness on Yes N/A N/A Palpation: Wound Preparation: Ulcer Cleansing: N/A N/A Rinsed/Irrigated with Saline Topical Anesthetic Applied: Other:  lidocaine 4% Treatment Notes Electronic Signature(s) Signed: 04/10/2016 3:28:31 PM By: Elpidio EricAfful, Rita BSN, RN Entered By: Elpidio EricAfful, Rita on 04/10/2016 08:37:05 Stmartin, Balinda HMarland Kitchen. ([id number]) -------------------------------------------------------------------------------- Multi-Disciplinary Care Plan Details Patient Name: [name], [name] Date of Service: 04/10/2016 8:00 AM Medical Record Patient Account Number: [id number] Number: Treating RN: Clover MealyAfful, RN, BSN, Rita [birth year] [phone](70 y.o. Other Clinician: Date of Birth/Sex: Female) Treating ROBSON, MICHAEL Primary Care Physician: Fidel LevyHAWKINS JR, JAMES Physician/Extender: G Referring Physician: Jones BroomHAWKINS JR, JAMES Weeks in Treatment: 0 Active Inactive Orientation to the Wound Care Program Nursing Diagnoses: Knowledge deficit related to the wound healing center program Goals: Patient/caregiver will verbalize understanding of the Wound Healing Center Program Date Initiated: 04/10/2016 Goal Status: Active Interventions: Provide education on orientation to the wound center Notes: Venous Leg Ulcer Nursing Diagnoses: Knowledge deficit related to disease process and management Potential for venous Insuffiency (use before diagnosis confirmed) Goals: Patient will maintain optimal edema control Date Initiated: 04/10/2016 Goal Status: Active Patient/caregiver will verbalize understanding of disease process and disease management Date Initiated: 04/10/2016 Goal Status: Active Verify adequate tissue perfusion prior to therapeutic compression application Date Initiated: 04/10/2016 Goal Status: Active Interventions: Compression as ordered Provide education on venous insufficiency Stang, Minyon H. ([id number]) Notes: Wound/Skin Impairment Nursing Diagnoses: Impaired tissue integrity Knowledge deficit related to smoking impact on wound healing Knowledge deficit related to ulceration/compromised skin integrity Goals: Patient/caregiver will verbalize  understanding of skin care regimen Date Initiated: 04/10/2016 Goal Status: Active Ulcer/skin breakdown will have a volume reduction of 30% by week 4 Date Initiated: 04/10/2016 Goal Status: Active Ulcer/skin breakdown will have a volume reduction of 50% by week 8 Date Initiated: 04/10/2016 Goal Status: Active Ulcer/skin breakdown will have a volume reduction of 80% by week 12 Date Initiated: 04/10/2016 Goal Status: Active Ulcer/skin breakdown will heal within 14 weeks Date Initiated: 04/10/2016 Goal Status: Active Interventions: Assess patient/caregiver ability to obtain necessary supplies Assess ulceration(s) every visit Provide education on ulcer and skin care Treatment Activities: Skin care regimen initiated : 04/10/2016 Topical wound management initiated : 04/10/2016 Notes: Electronic Signature(s) Signed: 04/10/2016 3:28:31 PM By: Elpidio EricAfful, Rita BSN, RN Entered By: Elpidio EricAfful, Rita on 04/10/2016 08:36:54 Grabski, Medina H. ([id number]) -------------------------------------------------------------------------------- Pain Assessment Details Patient Name: [name], [name] Date of Service: 04/10/2016 8:00 AM Medical Record Patient Account Number: [id number] Number: Treating RN: Clover MealyAfful, RN, BSN, Rita [birth year] [phone](70 y.o. Other Clinician: Date of Birth/Sex: Female) Treating ROBSON, MICHAEL Primary Care Physician: Fidel LevyHAWKINS JR, JAMES Physician/Extender: G Referring Physician: Fidel LevyHAWKINS JR, JAMES Weeks in Treatment: 0 Active Problems Location of Pain Severity and Description of Pain Patient Has Paino No Site Locations With Dressing Change: No Pain Management and Medication Current Pain Management: Electronic Signature(s) Signed: 04/10/2016 3:28:31 PM By: Elpidio EricAfful, Rita BSN, RN Entered By: Elpidio EricAfful, Rita on 04/10/2016 08:15:59 [name], [name] ([id number]) -------------------------------------------------------------------------------- Patient/Caregiver Education Details Patient Name: [name], Savannah  H. Date of Service: 04/10/2016 8:00 AM Medical Record  Patient Account Number: [id number] Number: Treating RN: Clover Mealy, RN, BSN, Rita 06/20/1946 [phone] y.o. Other Clinician: Date of Birth/Gender: Female) Treating ROBSON, MICHAEL Primary Care Physician: Fidel Levy Physician/Extender: G Referring Physician: Jones Broom in Treatment: 0 Education Assessment Education Provided To: Patient Education Topics Provided Venous: Methods: Explain/Verbal Responses: State content correctly Welcome To The Wound Care Center: Methods: Explain/Verbal Responses: State content correctly Wound/Skin Impairment: Methods: Explain/Verbal Responses: State content correctly Electronic Signature(s) Signed: 04/10/2016 3:28:31 PM By: Elpidio Eric BSN, RN Entered By: Elpidio Eric on 04/10/2016 09:01:00 [name], [name] ([id number]) -------------------------------------------------------------------------------- Wound Assessment Details Patient Name: [name], [name] Date of Service: 04/10/2016 8:00 AM Medical Record Patient Account Number: [id number] Number: Treating RN: Clover Mealy, RN, BSN, Rita Oct 25, 1946 [phone] y.o. Other Clinician: Date of Birth/Sex: Female) Treating ROBSON, MICHAEL Primary Care Physician: Fidel Levy Physician/Extender: G Referring Physician: Fidel Levy Weeks in Treatment: 0 Wound Status Wound Number: 1 Primary Skin Tear Etiology: Wound Location: Left Lower Leg - Lateral Wound Open Wounding Event: Trauma Status: Date Acquired: 02/22/2016 Comorbid Cataracts, Asthma, Chronic Obstructive Weeks Of Treatment: 0 History: Pulmonary Disease (COPD), Arrhythmia, Clustered Wound: No Hypertension, Peripheral Arterial Disease, Peripheral Venous Disease, Type II Diabetes Photos Photo Uploaded By: Elpidio Eric on 04/10/2016 15:19:00 Wound Measurements Length: (cm) 4 Width: (cm) 2 Depth: (cm) 0.1 Area: (cm) 6.283 Volume: (cm) 0.628 % Reduction in  Area: % Reduction in Volume: Epithelialization: None Tunneling: No Undermining: No Wound Description Classification: Partial Thickness Diabetic Severity (Wagner): Grade 1 Wound Margin: Distinct, outline attach Exudate Amount: Medium Exudate Type: Serosanguineous Exudate Color: red, brown Macauley, [name] ([id number]) Foul Odor After Cleansing: No ed Wound Bed Granulation Amount: Small (1-33%) Exposed Structure Necrotic Amount: Large (67-100%) Fascia Exposed: No Necrotic Quality: Adherent Slough Fat Layer Exposed: No Tendon Exposed: No Muscle Exposed: No Joint Exposed: No Bone Exposed: No Limited to Skin Breakdown Periwound Skin Texture Texture Color No Abnormalities Noted: No No Abnormalities Noted: No Callus: No Atrophie Blanche: No Crepitus: No Cyanosis: No Excoriation: No Ecchymosis: No Fluctuance: No Erythema: No Friable: No Hemosiderin Staining: No Induration: No Mottled: No Localized Edema: Yes Pallor: No Rash: No Rubor: No Scarring: No Temperature / Pain Moisture Temperature: No Abnormality No Abnormalities Noted: No Tenderness on Palpation: Yes Dry / Scaly: No Maceration: No Moist: Yes Wound Preparation Ulcer Cleansing: Rinsed/Irrigated with Saline Topical Anesthetic Applied: Other: lidocaine 4%, Treatment Notes Wound #1 (Left, Lateral Lower Leg) 1. Cleansed with: Clean wound with Normal Saline 3. Peri-wound Care: Barrier cream 4. Dressing Applied: Prisma Ag 5. Secondary Dressing Applied Dry Gauze Kerlix/Conform 7. Secured with Self adhesive bandage TRUDIE, CERVANTES ([id number]) Electronic Signature(s) Signed: 04/10/2016 3:28:31 PM By: Elpidio Eric BSN, RN Entered By: Elpidio Eric on 04/10/2016 08:36:10 Rider, Dyanne HMarland Kitchen ([id number]) -------------------------------------------------------------------------------- Vitals Details Patient Name: Cardiff, Betsabe H. Date of Service: 04/10/2016 8:00 AM Medical Record Patient Account Number:  [id number] Number: Treating RN: Clover Mealy, RN, BSN, Rita 08-14-1946 [phone] y.o. Other Clinician: Date of Birth/Sex: Female) Treating ROBSON, MICHAEL Primary Care Physician: Fidel Levy Physician/Extender: G Referring Physician: Jones Broom in Treatment: 0 Vital Signs Time Taken: 08:16 Temperature (F): 97.8 Height (in): 62 Pulse (bpm): 87 Source: Stated Respiratory Rate (breaths/min): 20 Weight (lbs): 157 Blood Pressure (mmHg): 138/119 Source: Stated Reference Range: 80 - 120 mg / dl Body Mass Index (BMI): 28.7 Electronic Signature(s) Signed: 04/10/2016 3:28:31 PM By: Elpidio Eric BSN, RN Entered By: Elpidio Eric on 04/10/2016 08:20:12

## 2016-04-12 NOTE — Progress Notes (Signed)
HESSIE, VARONE ([id number]) Visit Report for 04/10/2016 Chief Complaint Document Details Patient Name: [name], [name] Date of Service: 04/10/2016 8:00 AM Medical Record Patient Account Number: [id number] Number: Treating RN: Savannah Mealy, RN, BSN, [name] Dec 29, 1945 [phone] y.o. Other Clinician: Date of Birth/Sex: Female) Treating [name] Primary Care Physician/Extender: [name] Physician: Referring Physician: Jones [name] in Treatment: 0 Information Obtained from: Patient Chief Complaint Patient is here for a nonhealing wound on her left lateral leg which is been present for a month Electronic Signature(s) Signed: 04/11/2016 5:25:36 PM By: Savannah Najjar MD Entered By: [name] on 04/10/2016 09:02:01 [name], [name] ([id number]) -------------------------------------------------------------------------------- Debridement Details Patient Name: [name], [name] Date of Service: 04/10/2016 8:00 AM Medical Record Patient Account Number: [id number] Number: Treating RN: Savannah Mealy, RN, BSN, [name] 1946-01-18 [phone] y.o. Other Clinician: Date of Birth/Sex: Female) Treating [name] Primary Care Physician/Extender: [name] Physician: Referring Physician: Jones [name] in Treatment: 0 Debridement Performed for Wound #1 Left,Lateral Lower Leg Assessment: Performed By: Physician Savannah Caul, MD Debridement: Debridement Pre-procedure Yes Verification/Time Out Taken: Start Time: 08:43 Pain Control: Lidocaine 4% Topical Solution Level: Skin/Subcutaneous Tissue Total Area Debrided (L x 4 (cm) x 2 (cm) = 8 (cm) W): Tissue and other Non-Viable, Exudate, Fibrin/Slough, Subcutaneous material debrided: Instrument: Curette Bleeding: Moderate Hemostasis Achieved: Pressure End Time: 08:48 Procedural Pain: 3 Post Procedural Pain: 3 Response to Treatment: Procedure was tolerated well Post Debridement Measurements of Total  Wound Length: (cm) 4 Width: (cm) 2 Depth: (cm) 0.1 Volume: (cm) 0.628 Post Procedure Diagnosis Same as Pre-procedure Electronic Signature(s) Signed: 04/10/2016 3:28:31 PM By: [name] BSN, RN Signed: 04/11/2016 5:25:36 PM By: Savannah Najjar MD Entered By: [name] on 04/10/2016 09:00:03 [name], [name] ([id number]) [name], [name] ([id number]) -------------------------------------------------------------------------------- HPI Details Patient Name: [name], [name] Date of Service: 04/10/2016 8:00 AM Medical Record Patient Account Number: [id number] Number: Treating RN: Savannah Mealy, RN, BSN, [name] May 14, 1946 [phone] y.o. Other Clinician: Date of Birth/Sex: Female) Treating [name] Primary Care Physician/Extender: [name] Physician: Referring Physician: Jones [name] in Treatment: 0 History of Present Illness HPI Description: 04/10/16; this is a 70 year old woman who lives in [place] next to a sister who helps care for her. She traumatized her left leg roughly a month ago on the bed frame of a hospital bed she has at home. I description predominantly of her sister it sounds as though she had a laceration with a skin flap. The skin did not hold together and it came off the surface of the wound. She was seen by her primary care physician last week who noted a black eschar on the top of the wound. Prescribed a topical cream as well as cephalexin for erythema around the wound. It sounds as though the black eschar came off the wound leaving an open wound on the left leg. She has been using Neosporin as a primary wound dressing for most of the last month. She does not have a prior wound history. The patient is followed by [name] and it sounds as though at some point she had a procedure on her left leg possibly a stent. ABI in this clinic was 0.9 on the left with monophasic waveforms. She is a diabetic with last hemoglobin A1c at 6.8 on  03/19/16. She does not have a known history of neuropathy Electronic Signature(s) Signed: 04/11/2016 5:25:36 PM By: Savannah Najjar MD Entered By: [name]  on 04/10/2016 09:04:49 [name] ([id number]) -------------------------------------------------------------------------------- Physical Exam Details Patient Name: [name], [name] Date of Service: 04/10/2016 8:00 AM Medical Record Patient Account Number: [id number] Number: Treating RN: Savannah Mealy, RN, BSN, [name] 1945/12/29 [phone] y.o. Other Clinician: Date of Birth/Sex: Female) Treating [name] Primary Care Physician/Extender: [name] Physician: Referring Physician: Jones [name] in Treatment: 0 Constitutional Patient is hypertensive.. Pulse regular and within target range for patient.Marland Kitchen Respirations regular, non-labored and within target range.. Temperature is normal and within the target range for the patient.. Patient's appearance is neat and clean. Appears in no acute distress. Well nourished and well developed.. Eyes Conjunctivae clear. No discharge. No scleral icterus. Respiratory Patient is on chronic oxygen for 8 years however her respiratory effort work of breathing seems normal.. Shallower energy but no crackles or wheezes no accessory muscle use. Cardiovascular Heart sounds are normal heart rate was actually regular in spite of the A. fib history. I could not feel femoral or popliteal pulses. Pedal pulses are palpable. Edema present in both extremities. This is mild and goes to the level of her ankle. Gastrointestinal (GI) Abdomen is soft and non-distended without masses or tenderness. Bowel sounds active in all quadrants.. No liver or spleen enlargement or tenderness.. Lymphatic None palpable in the left popliteal or inguinal. Integumentary (Hair, Skin) Skin in her lower legs seem very thin. Wound exam below. Neurological Exam was normal to the microfilament  test. Psychiatric No evidence of depression, anxiety, or agitation. Calm, cooperative, and communicative. Appropriate interactions and affect.. Notes Wound exam; the area in question is on her left lateral lower leg. Covered in a yellow gelatinous eschar. The debridement was difficult because of bleeding related to Coumadin. I think the depth of this is probably down into the fat layer of her leg. There is no surrounding erythema no surrounding infection and as mentioned earlier if there was eschar in this wound this is now removed Electronic Signature(s) Signed: 04/11/2016 5:25:36 PM By: Savannah Najjar MD [name], [name] Kitchen ([id number]) Entered By: [name] on 04/10/2016 09:10:25 Marzella, [name] Kitchen ([id number]) -------------------------------------------------------------------------------- Physician Orders Details Patient Name: Aultman, Aerial H. Date of Service: 04/10/2016 8:00 AM Medical Record Patient Account Number: [id number] Number: Treating RN: Savannah Mealy, RN, BSN, [name] [birth year] [phone] y.o. Other Clinician: Date of Birth/Sex: Female) Treating Giovanni Biby Primary Care Physician/Extender: [name] Physician: Referring Physician: Jones [name] in Treatment: 0 Verbal / Phone Orders: Yes Clinician: Afful, RN, BSN, [name] Read Back and Verified: Yes Diagnosis Coding Wound Cleansing Wound #1 Left,Lateral Lower Leg o Cleanse wound with mild soap and water o May Shower, gently pat wound dry prior to applying new dressing. o May shower with protection. Anesthetic Wound #1 Left,Lateral Lower Leg o Topical Lidocaine 4% cream applied to wound bed prior to debridement Skin Barriers/Peri-Wound Care Wound #1 Left,Lateral Lower Leg o Barrier cream Primary Wound Dressing Wound #1 Left,Lateral Lower Leg o Prisma Ag Secondary Dressing Wound #1 Left,Lateral Lower Leg o Conform/Kerlix - and coban Dressing Change Frequency Wound #1 Left,Lateral Lower  Leg o Change dressing every week Follow-up Appointments Wound #1 Left,Lateral Lower Leg o Return Appointment in 1 week. Edema Control Wound #1 Left,Lateral Lower Leg [name], [name] ([id number]) o Elevate legs to the level of the heart and pump ankles as often as possible Additional Orders / Instructions Wound #1 Left,Lateral Lower Leg o Increase protein intake. o OK to return to work with the following restrictions: o Activity as tolerated Oxygen  Administration Wound #1 Left,Lateral Lower Leg o While patient is in clinic, provide supplimental oxygen via nasal cannula at liters/min __ : - 2liters Electronic Signature(s) Signed: 04/10/2016 3:28:31 PM By: [name] BSN, RN Signed: 04/11/2016 5:25:36 PM By: Savannah Najjar MD Entered By: [name] on 04/10/2016 08:50:23 Chapel, Kynlei H. ([id number]) -------------------------------------------------------------------------------- Problem List Details Patient Name: Pizano, Liviana H. Date of Service: 04/10/2016 8:00 AM Medical Record Patient Account Number: [id number] Number: Treating RN: Savannah Mealy, RN, BSN, [name] 1946/08/04 [phone] y.o. 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  ([id number]) -------------------------------------------------------------------------------- Progress Note Details Patient Name: [name], [name] Date of Service: 04/10/2016 8:00 AM Medical Record Patient Account Number: [id number] Number: Treating RN: Savannah Mealy, RN, BSN, [name] Oct 12, 1946 [phone] y.o. Other Clinician: Date of Birth/Sex: Female) Treating Pierra Skora Primary Care Physician/Extender: [name] Physician: Referring Physician: Jones [name] in Treatment: 0 Subjective Chief Complaint Information obtained from Patient Patient is here for a nonhealing wound on her left lateral leg which is been present for a month History of Present Illness (HPI) 04/10/16; this is a 70 year old woman who lives in [place] next to a sister who helps care for her. She traumatized her left leg roughly a month ago on the bed frame of a hospital bed she has at home. I description predominantly of her sister it sounds as though she had a laceration with a skin flap. The skin did not hold together and it came off the surface of the wound. She was seen by her primary care physician last week who noted a black eschar on the top of the wound. Prescribed a topical cream as well as cephalexin for erythema around the wound. It sounds as though the black eschar came off the wound leaving an open wound on the left leg. She has been using Neosporin as a primary wound dressing for most of the last month. She does not have a prior wound history. The patient is followed by [name] and it sounds as though at some point she had a procedure on her left leg possibly a stent. ABI in this clinic was 0.9 on the left with monophasic waveforms. She is a diabetic with last hemoglobin A1c at 6.8 on 03/19/16. She does not have a known history of neuropathy Wound History Patient presents with 1 open wound that has been present for approximately 1 month. Patient has been treating wound  in the following manner: neosporin. Laboratory tests have not been performed in the last month. Patient reportedly has not tested positive for an antibiotic resistant organism. Patient reportedly has not tested positive for osteomyelitis. Patient reportedly has not had testing performed to evaluate circulation in the legs. Patient experiences the following problems associated with their wounds: swelling. Patient History Information obtained from Patient. Allergies PCN Family History Sponaugle, BARBEE MAMULA. ([id number]) Cancer - Siblings, Heart Disease - Mother, Father, Hypertension - Father, Mother, Stroke - Mother, Siblings, Thyroid Problems - Siblings, No family  history of Diabetes, Hereditary Spherocytosis, Kidney Disease, Lung Disease, Seizures, Tuberculosis. Social History Former smoker, Marital Status - Divorced, Alcohol Use - Never, Drug Use - No History, Caffeine Use - Moderate. Medical History Eyes Patient has history of Cataracts Ear/Nose/Mouth/Throat Denies history of Chronic sinus problems/congestion, Middle ear problems Hematologic/Lymphatic Denies history of Anemia, Hemophilia, Human Immunodeficiency Virus, Lymphedema, Sickle Cell Disease Respiratory Patient has history of Asthma, Chronic Obstructive Pulmonary Disease (COPD) Cardiovascular Patient has history of Arrhythmia, Hypertension, Peripheral Arterial Disease, Peripheral Venous Disease Gastrointestinal Denies history of Cirrhosis , Colitis, Crohn s, Hepatitis A, Hepatitis B, Hepatitis C Endocrine Patient has history of Type II Diabetes Denies history of Type I Diabetes Genitourinary Denies history of End Stage Renal Disease Immunological Denies history of Lupus Erythematosus, Raynaud s, Scleroderma Integumentary (Skin) Denies history of History of Burn, History of pressure wounds Musculoskeletal Denies history of Gout, Rheumatoid Arthritis, Osteoarthritis, Osteomyelitis Neurologic Denies history of Dementia,  Neuropathy, Quadriplegia, Paraplegia, Seizure Disorder Oncologic Denies history of Received Chemotherapy, Received Radiation Psychiatric Denies history of Anorexia/bulimia, Confinement Anxiety Blood sugar is tested. Blood sugar results noted at the following times: Breakfast - 118. Medical And Surgical History Notes Ear/Nose/Mouth/Throat Hard of Hearing Musculoskeletal left hip surgery Review of Systems (ROS) Constitutional Symptoms (General Health) The patient has no complaints or symptoms. Harwood, Emerie H. ([id number]) Eyes Complains or has symptoms of Glasses / Contacts. Ear/Nose/Mouth/Throat The patient has no complaints or symptoms. Hematologic/Lymphatic The patient has no complaints or symptoms. Respiratory The patient has no complaints or symptoms. Cardiovascular Complains or has symptoms of LE edema. Gastrointestinal The patient has no complaints or symptoms. Endocrine The patient has no complaints or symptoms. Genitourinary The patient has no complaints or symptoms. Immunological The patient has no complaints or symptoms. Integumentary (Skin) Complains or has symptoms of Wounds, Bleeding or bruising tendency, Breakdown, Swelling. Musculoskeletal The patient has no complaints or symptoms. Neurologic The patient has no complaints or symptoms. Oncologic The patient has no complaints or symptoms. Psychiatric The patient has no complaints or symptoms. Objective Constitutional Patient is hypertensive.. Pulse regular and within target range for patient.Marland Kitchen Respirations regular, non-labored and within target range.. Temperature is normal and within the target range for the patient.. Patient's appearance is neat and clean. Appears in no acute distress. Well nourished and well developed.. Vitals Time Taken: 8:16 AM, Height: 62 in, Source: Stated, Weight: 157 lbs, Source: Stated, BMI: 28.7, Temperature: 97.8 F, Pulse: 87 bpm, Respiratory Rate: 20 breaths/min, Blood  Pressure: 138/119 mmHg. Eyes Conjunctivae clear. No discharge. No scleral icterus. Respiratory Patient is on chronic oxygen for 8 years however her respiratory effort work of breathing seems normal.. Cork, Anasophia [name] Kitchen ([id number]) Shallower energy but no crackles or wheezes no accessory muscle use. Cardiovascular Heart sounds are normal heart rate was actually regular in spite of the A. fib history. I could not feel femoral or popliteal pulses. Pedal pulses are palpable. Edema present in both extremities. This is mild and goes to the level of her ankle. Gastrointestinal (GI) Abdomen is soft and non-distended without masses or tenderness. Bowel sounds active in all quadrants.. No liver or spleen enlargement or tenderness.. Lymphatic None palpable in the left popliteal or inguinal. Neurological Exam was normal to the microfilament test. Psychiatric No evidence of depression, anxiety, or agitation. Calm, cooperative, and communicative. Appropriate interactions and affect.. General Notes: Wound exam; the area in question is on her left lateral lower leg. Covered in a yellow gelatinous eschar. The debridement was difficult because of bleeding related to Coumadin. I  think the depth of this is probably down into the fat layer of her leg. There is no surrounding erythema no surrounding infection and as mentioned earlier if there was eschar in this wound this is now removed Integumentary (Hair, Skin) Skin in her lower legs seem very thin. Wound exam below. Wound #1 status is Open. Original cause of wound was Trauma. The wound is located on the Left,Lateral Lower Leg. The wound measures 4cm length x 2cm width x 0.1cm depth; 6.283cm^2 area and 0.628cm^3 volume. The wound is limited to skin breakdown. There is no tunneling or undermining noted. There is a medium amount of serosanguineous drainage noted. The wound margin is distinct with the outline attached to the wound base. There is small (1-33%)  granulation within the wound bed. There is a large (67-100%) amount of necrotic tissue within the wound bed including Adherent Slough. The periwound skin appearance exhibited: Localized Edema, Moist. The periwound skin appearance did not exhibit: Callus, Crepitus, Excoriation, Fluctuance, Friable, Induration, Rash, Scarring, Dry/Scaly, Maceration, Atrophie Blanche, Cyanosis, Ecchymosis, Hemosiderin Staining, Mottled, Pallor, Rubor, Erythema. Periwound temperature was noted as No Abnormality. The periwound has tenderness on palpation. Assessment Active Problems ICD-10 E11.622 - Type 2 diabetes mellitus with other skin ulcer [name], [name] 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 There was a Skin/Subcutaneous Tissue Debridement (40981-19147) debridement with total area of 8 sq cm performed by Savannah Caul, MD. with the following instrument(s): Curette to remove Non-Viable tissue/material including Exudate, Fibrin/Slough, and Subcutaneous after achieving pain control using Lidocaine 4% Topical Solution. A time out was conducted prior to the start of the procedure. A Moderate amount of bleeding was controlled with Pressure. The procedure was tolerated well with a pain level of 3 throughout and a pain level of 3 following the procedure. Post Debridement Measurements: 4cm length x 2cm width x 0.1cm depth; 0.628cm^3 volume. Post procedure Diagnosis Wound #1: Same as Pre-Procedure Plan Wound Cleansing: Wound #1 Left,Lateral Lower Leg: Cleanse wound with mild soap and water May Shower, gently pat wound dry prior to applying new dressing. May shower with protection. Anesthetic: Wound #1 Left,Lateral Lower Leg: Topical Lidocaine 4% cream applied to wound bed prior to  debridement Skin Barriers/Peri-Wound Care: Wound #1 Left,Lateral Lower Leg: Barrier cream Primary Wound Dressing: Wound #1 Left,Lateral Lower Leg: Prisma Ag Secondary Dressing: Wound #1 Left,Lateral Lower Leg: Conform/Kerlix - and coban Dressing Change Frequency: Wound #1 Left,Lateral Lower Leg: Change dressing every week Follow-up Appointments: Wound #1 Left,Lateral Lower Leg: [name], [name] ([id number]) Return Appointment in 1 week. Edema Control: Wound #1 Left,Lateral Lower Leg: Elevate legs to the level of the heart and pump ankles as often as possible Additional Orders / Instructions: Wound #1 Left,Lateral Lower Leg: Increase protein intake. OK to return to work with the following restrictions: Activity as tolerated Oxygen Administration: Wound #1 Left,Lateral Lower Leg: While patient is in clinic, provide supplimental oxygen via nasal cannula at liters/min __ : - 2liters #1 we applied silver collagen, Kerlix and Coban. #2 currently she has had recent ABIs/arterial Dopplers of vascular surgery will try to solicit these results however ABI in this clinic was 0.9 #3 I did not see her diastolic blood pressure and told she had already left the clinic, we'll try to contact her see if she has a home  monitor and/or to follow up at her primary doctor's office. #4 I did not feel that she needed any further antibiotics nor were any further cultures done there was no surrounding edema no soft tissue crepitus Electronic Signature(s) Signed: 04/11/2016 5:25:36 PM By: Savannah Najjarobson, Shyloh Krinke MD Entered By: Savannah Najjarobson, Camya Haydon on 04/10/2016 09:12:29 Leccese, [name] -------------------------------------------------------------------------------- ROS/PFSH Details Patient Name: Klinck, [name] H. Date of Service: 04/10/2016 8:00 AM Medical Record Patient Account Number: [id number] Number: Treating RN: Savannah MealyAfful, RN, BSN, [name] 1946-01-06 [phone](70 y.o. Other Clinician: Date of  Birth/Sex: Female) Treating Ilona Colley Primary Care Physician/Extender: Savannah LocketG HAWKINS JR, JAMES Physician: Referring Physician: Jones BroomHAWKINS JR, JAMES Weeks in Treatment: 0 Information Obtained From Patient Wound History Do you currently have one or more open woundso Yes How many open wounds do you currently haveo 1 Approximately how long have you had your woundso 1 month How have you been treating your wound(s) until nowo neosporin Has your wound(s) ever healed and then re-openedo No Have you had any lab work done in the past montho No Have you tested positive for an antibiotic resistant organism (MRSA, VRE)o No Have you tested positive for osteomyelitis (bone infection)o No Have you had any tests for circulation on your legso No Have you had other problems associated with your woundso Swelling Eyes Complaints and Symptoms: Positive for: Glasses / Contacts Medical History: Positive for: Cataracts Cardiovascular Complaints and Symptoms: Positive for: LE edema Medical History: Positive for: Arrhythmia; Hypertension; Peripheral Arterial Disease; Peripheral Venous Disease Integumentary (Skin) Complaints and Symptoms: Positive for: Wounds; Bleeding or bruising tendency; Breakdown; Swelling Medical History: Negative for: History of Burn; History of pressure wounds Mcguinness, Latissa H. ([id number]) Constitutional Symptoms (General Health) Complaints and Symptoms: No Complaints or Symptoms Ear/Nose/Mouth/Throat Complaints and Symptoms: No Complaints or Symptoms Medical History: Negative for: Chronic sinus problems/congestion; Middle ear problems Past Medical History Notes: Hard of Hearing Hematologic/Lymphatic Complaints and Symptoms: No Complaints or Symptoms Medical History: Negative for: Anemia; Hemophilia; Human Immunodeficiency Virus; Lymphedema; Sickle Cell Disease Respiratory Complaints and Symptoms: No Complaints or Symptoms Medical History: Positive for: Asthma; Chronic  Obstructive Pulmonary Disease (COPD) Gastrointestinal Complaints and Symptoms: No Complaints or Symptoms Medical History: Negative for: Cirrhosis ; Colitis; Crohnos; Hepatitis A; Hepatitis B; Hepatitis C Endocrine Complaints and Symptoms: No Complaints or Symptoms Medical History: Positive for: Type II Diabetes Negative for: Type I Diabetes Time with diabetes: 1 year Blood sugar tested every day: Yes Tested : Blood sugar testing results: Breakfast: 118 Beecher, Leana H. ([id number]) Genitourinary Complaints and Symptoms: No Complaints or Symptoms Medical History: Negative for: End Stage Renal Disease Immunological Complaints and Symptoms: No Complaints or Symptoms Medical History: Negative for: Lupus Erythematosus; Raynaudos; Scleroderma Musculoskeletal Complaints and Symptoms: No Complaints or Symptoms Medical History: Negative for: Gout; Rheumatoid Arthritis; Osteoarthritis; Osteomyelitis Past Medical History Notes: left hip surgery Neurologic Complaints and Symptoms: No Complaints or Symptoms Medical History: Negative for: Dementia; Neuropathy; Quadriplegia; Paraplegia; Seizure Disorder Oncologic Complaints and Symptoms: No Complaints or Symptoms Medical History: Negative for: Received Chemotherapy; Received Radiation Psychiatric Complaints and Symptoms: No Complaints or Symptoms Medical History: Negative for: Anorexia/bulimia; Confinement Anxiety HBO Extended History Items Mahoney, Aerilyn H. ([id number]) Eyes: Cataracts Family and Social History Cancer: Yes - Siblings; Diabetes: No; Heart Disease: Yes - Mother, Father; Hereditary Spherocytosis: No; Hypertension: Yes - Father, Mother; Kidney Disease: No; Lung Disease: No; Seizures: No; Stroke: Yes - Mother, Siblings; Thyroid Problems: Yes - Siblings; Tuberculosis: No; Former smoker; Marital Status - Divorced; Alcohol Use: Never; Drug Use: No History; Caffeine  Use: Moderate; Financial Concerns: No; Food, Clothing or  Shelter Needs: No; Support System Lacking: No; Transportation Concerns: No; Advanced Directives: No; Patient does not want information on Advanced Directives; Living Will: No Electronic Signature(s) Signed: 04/10/2016 3:28:31 PM By: [name] BSN, RN Signed: 04/11/2016 5:25:36 PM By: Savannah Najjar MD Entered By: [name] on 04/10/2016 08:44:42 Roop, Rashon H. ([id number]) -------------------------------------------------------------------------------- SuperBill Details Patient Name: Derenzo, Janiel H. Date of Service: 04/10/2016 Medical Record Patient Account Number: [id number] Number: Treating RN: Savannah Mealy, RN, BSN, [name] 01/28/1946 [phone] y.o. Other Clinician: Date of Birth/Sex: Female) Treating Thorin Starner Primary Care Physician/Extender: [name] Physician: Weeks in Treatment: 0 Referring Physician: Fidel Levy Diagnosis Coding ICD-10 Codes Code Description [phone] Type 2 diabetes mellitus with other skin ulcer E11.51 Type 2 diabetes mellitus with diabetic peripheral angiopathy without gangrene S81.832D Puncture wound without foreign body, left lower leg, subsequent encounter Facility Procedures CPT4 Code: 30865784 Description: 99213 - WOUND CARE VISIT-LEV 3 EST PT Modifier: Quantity: 1 CPT4 Code: 69629528 Description: 11042 - DEB SUBQ TISSUE 20 SQ CM/< ICD-10 Description Diagnosis E11.622 Type 2 diabetes mellitus with other skin ulcer Modifier: Quantity: 1 Physician Procedures CPT4 Code: 4132440 Description: WC PHYS LEVEL 3 o NEW PT ICD-10 Description Diagnosis E11.622 Type 2 diabetes mellitus with other skin ulcer Modifier: Quantity: 1 CPT4 Code: 1027253 Description: 11042 - WC PHYS SUBQ TISS 20 SQ CM ICD-10 Description Diagnosis E11.622 Type 2 diabetes mellitus with other skin ulcer Modifier: Quantity: 1 Electronic Signature(s) Signed: 04/11/2016 5:25:36 PM By: Savannah Najjar MD Entered By: [name] on 04/10/2016 09:13:13

## 2016-04-17 ENCOUNTER — Encounter: Payer: Medicare Other | Attending: Internal Medicine | Admitting: Internal Medicine

## 2016-04-17 ENCOUNTER — Other Ambulatory Visit: Payer: Self-pay | Admitting: Family Medicine

## 2016-04-17 DIAGNOSIS — J449 Chronic obstructive pulmonary disease, unspecified: Secondary | ICD-10-CM | POA: Insufficient documentation

## 2016-04-17 DIAGNOSIS — E11622 Type 2 diabetes mellitus with other skin ulcer: Secondary | ICD-10-CM | POA: Diagnosis not present

## 2016-04-17 DIAGNOSIS — E1151 Type 2 diabetes mellitus with diabetic peripheral angiopathy without gangrene: Secondary | ICD-10-CM | POA: Diagnosis not present

## 2016-04-17 DIAGNOSIS — Z87891 Personal history of nicotine dependence: Secondary | ICD-10-CM | POA: Diagnosis not present

## 2016-04-17 DIAGNOSIS — I10 Essential (primary) hypertension: Secondary | ICD-10-CM | POA: Diagnosis not present

## 2016-04-17 DIAGNOSIS — Z9981 Dependence on supplemental oxygen: Secondary | ICD-10-CM | POA: Diagnosis not present

## 2016-04-17 DIAGNOSIS — J45909 Unspecified asthma, uncomplicated: Secondary | ICD-10-CM | POA: Insufficient documentation

## 2016-04-17 DIAGNOSIS — S81832D Puncture wound without foreign body, left lower leg, subsequent encounter: Secondary | ICD-10-CM | POA: Diagnosis not present

## 2016-04-17 DIAGNOSIS — I739 Peripheral vascular disease, unspecified: Secondary | ICD-10-CM | POA: Diagnosis not present

## 2016-04-17 DIAGNOSIS — Z7901 Long term (current) use of anticoagulants: Secondary | ICD-10-CM

## 2016-04-17 DIAGNOSIS — S81812A Laceration without foreign body, left lower leg, initial encounter: Secondary | ICD-10-CM | POA: Diagnosis not present

## 2016-04-17 DIAGNOSIS — X58XXXD Exposure to other specified factors, subsequent encounter: Secondary | ICD-10-CM | POA: Diagnosis not present

## 2016-04-17 DIAGNOSIS — H9190 Unspecified hearing loss, unspecified ear: Secondary | ICD-10-CM | POA: Insufficient documentation

## 2016-04-17 LAB — PROTIME-INR
INR: 3.7 — AB (ref 0.8–1.2)
PROTHROMBIN TIME: 37.9 s — AB (ref 9.1–12.0)

## 2016-04-18 NOTE — Progress Notes (Signed)
[name], [name] ([id number]) Visit Report for 04/17/2016 Chief Complaint Document Details Patient Name: [name], [name] Date of Service: 04/17/2016 10:00 AM Medical Record Patient Account Number: [id number] Number: Treating RN: Afful, RN, BSN, Rita [birth year] [phone](70 y.o. Other Clinician: Date of Birth/Sex: Female) Treating ROBSON, MICHAEL Primary Care Physician/Extender: Comer LocketG HAWKINS JR, JAMES Physician: Referring Physician: Jones BroomHAWKINS JR, JAMES Weeks in Treatment: 1 Information Obtained from: Patient Chief Complaint Patient is here for a nonhealing wound on her left lateral leg which is been present for a month Electronic Signature(s) Signed: 04/18/2016 7:56:34 AM By: Baltazar Najjarobson, Michael MD Entered By: Baltazar Najjarobson, Michael on 04/17/2016 10:21:50 Gerstenberger, Leisa [name]. ([id number]) -------------------------------------------------------------------------------- Debridement Details Patient Name: [name], [name] Date of Service: 04/17/2016 10:00 AM Medical Record Patient Account Number: [id number] Number: Treating RN: Afful, RN, BSN, Rita [birth year] [phone](70 y.o. Other Clinician: Date of Birth/Sex: Female) Treating ROBSON, MICHAEL Primary Care Physician/Extender: Comer LocketG HAWKINS JR, JAMES Physician: Referring Physician: Jones BroomHAWKINS JR, JAMES Weeks in Treatment: 1 Debridement Performed for Wound #1 Left,Lateral Lower Leg Assessment: Performed By: Physician Maxwell CaulOBSON, MICHAEL G, MD Debridement: Debridement Pre-procedure Yes Verification/Time Out Taken: Start Time: 10:10 Pain Control: Lidocaine 4% Topical Solution Level: Skin/Subcutaneous Tissue Total Area Debrided (L x 3.8 (cm) x 2.4 (cm) = 9.12 (cm) W): Tissue and other Non-Viable, Exudate, Fibrin/Slough, Subcutaneous material debrided: Instrument: Curette Bleeding: Moderate Hemostasis Achieved: Silver Nitrate End Time: 10:15 Procedural Pain: 3 Post Procedural Pain: 4 Response to Treatment: Procedure was tolerated well Post Debridement Measurements of  Total Wound Length: (cm) 3.8 Width: (cm) 2.4 Depth: (cm) 0.2 Volume: (cm) 1.433 Post Procedure Diagnosis Same as Pre-procedure Electronic Signature(s) Signed: 04/17/2016 2:19:20 PM By: Elpidio EricAfful, Rita BSN, RN Signed: 04/18/2016 7:56:34 AM By: Baltazar Najjarobson, Michael MD Entered By: Baltazar Najjarobson, Michael on 04/17/2016 10:21:27 [name], [name] [name], [name] -------------------------------------------------------------------------------- HPI Details Patient Name: [name], [name] Date of Service: 04/17/2016 10:00 AM Medical Record Patient Account Number: [id number] Number: Treating RN: Afful, RN, BSN, Rita [birth year] [phone](70 y.o. Other Clinician: Date of Birth/Sex: Female) Treating ROBSON, MICHAEL Primary Care Physician/Extender: Comer LocketG HAWKINS JR, JAMES Physician: Referring Physician: Jones BroomHAWKINS JR, JAMES Weeks in Treatment: 1 History of Present Illness HPI Description: 04/10/16; this is a 70 year old woman who lives in [place] next to a sister who helps care for her. She traumatized her left leg roughly a month ago on the bed frame of a hospital bed she has at home. I description predominantly of her sister it sounds as though she had a laceration with a skin flap. The skin did not hold together and it came off the surface of the wound. She was seen by her primary care physician last week who noted a black eschar on the top of the wound. Prescribed a topical cream as well as cephalexin for erythema around the wound. It sounds as though the black eschar came off the wound leaving an open wound on the left leg. She has been using Neosporin as a primary wound dressing for most of the last month. She does not have a prior wound history. The patient is followed by Dr. Wyn Quakerew vascular surgery and it sounds as though at some point she had a procedure on her left leg possibly a stent. ABI in this clinic was 0.9 on the left with monophasic waveforms. She is a diabetic with last hemoglobin A1c at  6.8 on 03/19/16. She does not have a known history of neuropathy 04/17/16; no major change in the condition of this wound on her left  anterior lateral leg. There is mild erythema but no tenderness and I don't think there is any evidence of infection area we still don't have Dr. Kristeen Mans vascular surgery notes Electronic Signature(s) Signed: 04/18/2016 7:56:34 AM By: Baltazar Najjar MD Entered By: Baltazar Najjar on 04/17/2016 10:23:17 [name], [name] -------------------------------------------------------------------------------- Physical Exam Details Patient Name: [name], [name] Date of Service: 04/17/2016 10:00 AM Medical Record Patient Account Number: [id number] Number: Treating RN: Afful, RN, BSN, Rita [birth year] [phone] y.o. Other Clinician: Date of Birth/Sex: Female) Treating ROBSON, MICHAEL Primary Care Physician/Extender: Comer Locket Physician: Referring Physician: Jones Broom in Treatment: 1 Constitutional Sitting or standing Blood Pressure is within target range for patient.. Pulse regular and within target range for patient.Marland Kitchen Respirations regular, non-labored and within target range.. Temperature is normal and within the target range for the patient.. Patient's appearance is neat and clean. Appears in no acute distress. Well nourished and well developed.. Cardiovascular Pedal pulses palpable and strong bilaterally.. Edema is generally well controlled them;. Notes Wound exam; we are clearly down into subcutaneous fat. Debridement is very difficult secondary to bleeding. She is having her INR checked this afternoon. Erythema around the wound over no tenderness and no evidence of infection Electronic Signature(s) Signed: 04/18/2016 7:56:34 AM By: Baltazar Najjar MD Entered By: Baltazar Najjar on 04/17/2016 10:25:35 Cubit, Savannah HMarland Kitchen ([id number]) -------------------------------------------------------------------------------- Physician Orders  Details Patient Name: Copelin, Hebe [name]. Date of Service: 04/17/2016 10:00 AM Medical Record Patient Account Number: [id number] Number: Treating RN: Afful, RN, BSN, Rita 1946/08/13 [phone] y.o. Other Clinician: Date of Birth/Sex: Female) Treating ROBSON, MICHAEL Primary Care Physician/Extender: Comer Locket Physician: Referring Physician: Jones Broom in Treatment: 1 Verbal / Phone Orders: Yes Clinician: Afful, RN, BSN, Rita Read Back and Verified: Yes Diagnosis Coding Wound Cleansing Wound #1 Left,Lateral Lower Leg o May shower with protection. o No tub bath. o Other: - DO not remove dressing Anesthetic Wound #1 Left,Lateral Lower Leg o Topical Lidocaine 4% cream applied to wound bed prior to debridement Skin Barriers/Peri-Wound Care Wound #1 Left,Lateral Lower Leg o Barrier cream Primary Wound Dressing Wound #1 Left,Lateral Lower Leg o Prisma Ag Secondary Dressing Wound #1 Left,Lateral Lower Leg o Conform/Kerlix - and coban Dressing Change Frequency Wound #1 Left,Lateral Lower Leg o Change dressing every week Follow-up Appointments Wound #1 Left,Lateral Lower Leg o Return Appointment in 1 week. Edema Control Wound #1 Left,Lateral Lower Leg [name], [name] ([id number]) o Elevate legs to the level of the heart and pump ankles as often as possible Additional Orders / Instructions Wound #1 Left,Lateral Lower Leg o Increase protein intake. o OK to return to work with the following restrictions: o Activity as tolerated Oxygen Administration Wound #1 Left,Lateral Lower Leg o While patient is in clinic, provide supplimental oxygen via nasal cannula at liters/min __ : - 2liters Electronic Signature(s) Signed: 04/17/2016 2:19:20 PM By: Elpidio Eric BSN, RN Signed: 04/18/2016 7:56:34 AM By: Baltazar Najjar MD Entered By: Elpidio Eric on 04/17/2016 10:16:06 [name], [name]  ([id number]) -------------------------------------------------------------------------------- Problem List Details Patient Name: [name], [name] Date of Service: 04/17/2016 10:00 AM Medical Record Patient Account Number: [id number] Number: Treating RN: Afful, RN, BSN, Rita 01-13-1946 [phone] y.o. Other Clinician: Date of Birth/Sex: Female) Treating ROBSON, MICHAEL Primary Care Physician/Extender: Comer Locket Physician: Referring Physician: Jones Broom in Treatment: 1 Active Problems ICD-10 Encounter Code Description Active Date Diagnosis E11.622 Type 2 diabetes mellitus with other skin ulcer 04/10/2016 Yes E11.51  Type 2 diabetes mellitus with diabetic peripheral 04/10/2016 Yes angiopathy without gangrene S81.832D Puncture wound without foreign body, left lower leg, 04/10/2016 Yes subsequent encounter Inactive Problems Resolved Problems Electronic Signature(s) Signed: 04/18/2016 7:56:34 AM By: Baltazar Najjar MD Entered By: Baltazar Najjar on 04/17/2016 10:20:59 [name], [name] -------------------------------------------------------------------------------- Progress Note Details Patient Name: Flock, [name] Date of Service: 04/17/2016 10:00 AM Medical Record Patient Account Number: [id number] Number: Treating RN: Afful, RN, BSN, Rita 10-26-1946 [phone] y.o. Other Clinician: Date of Birth/Sex: Female) Treating ROBSON, MICHAEL Primary Care Physician/Extender: Comer Locket Physician: Referring Physician: Jones Broom in Treatment: 1 Subjective Chief Complaint Information obtained from Patient Patient is here for a nonhealing wound on her left lateral leg which is been present for a month History of Present Illness (HPI) 04/10/16; this is a 70 year old woman who lives in [place] next to a sister who helps care for her. She traumatized her left leg roughly a month ago on the bed frame of a hospital bed she has at home.  I description predominantly of her sister it sounds as though she had a laceration with a skin flap. The skin did not hold together and it came off the surface of the wound. She was seen by her primary care physician last week who noted a black eschar on the top of the wound. Prescribed a topical cream as well as cephalexin for erythema around the wound. It sounds as though the black eschar came off the wound leaving an open wound on the left leg. She has been using Neosporin as a primary wound dressing for most of the last month. She does not have a prior wound history. The patient is followed by Dr. Wyn Quaker vascular surgery and it sounds as though at some point she had a procedure on her left leg possibly a stent. ABI in this clinic was 0.9 on the left with monophasic waveforms. She is a diabetic with last hemoglobin A1c at 6.8 on 03/19/16. She does not have a known history of neuropathy 04/17/16; no major change in the condition of this wound on her left anterior lateral leg. There is mild erythema but no tenderness and I don't think there is any evidence of infection area we still don't have Dr. Kristeen Mans vascular surgery notes Objective Constitutional Sitting or standing Blood Pressure is within target range for patient.. Pulse regular and within target range for patient.Marland Kitchen Respirations regular, non-labored and within target range.. Temperature is normal and within the target range for the patient.. Patient's appearance is neat and clean. Appears in no acute distress. Well nourished and well developed.Pernell Dupre, Ayslin Rexene Edison ([id number]) Vitals Time Taken: 9:57 AM, Height: 62 in, Weight: 157 lbs, BMI: 28.7, Temperature: 98 F, Pulse: 79 bpm, Respiratory Rate: 20 breaths/min, Blood Pressure: 133/74 mmHg. Cardiovascular Pedal pulses palpable and strong bilaterally.. Edema is generally well controlled them;. General Notes: Wound exam; we are clearly down into subcutaneous fat. Debridement is very  difficult secondary to bleeding. She is having her INR checked this afternoon. Erythema around the wound over no tenderness and no evidence of infection Integumentary (Hair, Skin) Wound #1 status is Open. Original cause of wound was Trauma. The wound is located on the Left,Lateral Lower Leg. The wound measures 3.8cm length x 2.4cm width x 0.2cm depth; 7.163cm^2 area and 1.433cm^3 volume. The wound is limited to skin breakdown. There is no tunneling or undermining noted. There is a medium amount of serosanguineous drainage noted. The wound margin is distinct  with the outline attached to the wound base. There is medium (34-66%) granulation within the wound bed. There is a medium (34-66%) amount of necrotic tissue within the wound bed including Adherent Slough. The periwound skin appearance exhibited: Localized Edema, Moist, Mottled. The periwound skin appearance did not exhibit: Callus, Crepitus, Excoriation, Fluctuance, Friable, Induration, Rash, Scarring, Dry/Scaly, Maceration, Atrophie Blanche, Cyanosis, Ecchymosis, Hemosiderin Staining, Pallor, Rubor, Erythema. Periwound temperature was noted as No Abnormality. The periwound has tenderness on palpation. 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 There was a Skin/Subcutaneous Tissue Debridement ([id number]) debridement with total area of 9.12 sq cm performed by Maxwell Caul, MD. with the following instrument(s): Curette to remove Non-Viable tissue/material including Exudate, Fibrin/Slough, and Subcutaneous after achieving pain control using Lidocaine 4% Topical Solution. A time out was conducted prior to the start of the procedure. A  Moderate amount of bleeding was controlled with Conover, Raena [name]. ([id number]) Silver Nitrate. The procedure was tolerated well with a pain level of 3 throughout and a pain level of 4 following the procedure. Post Debridement Measurements: 3.8cm length x 2.4cm width x 0.2cm depth; 1.433cm^3 volume. Post procedure Diagnosis Wound #1: Same as Pre-Procedure Plan Wound Cleansing: Wound #1 Left,Lateral Lower Leg: May shower with protection. No tub bath. Other: - DO not remove dressing Anesthetic: Wound #1 Left,Lateral Lower Leg: Topical Lidocaine 4% cream applied to wound bed prior to debridement Skin Barriers/Peri-Wound Care: Wound #1 Left,Lateral Lower Leg: Barrier cream Primary Wound Dressing: Wound #1 Left,Lateral Lower Leg: Prisma Ag Secondary Dressing: Wound #1 Left,Lateral Lower Leg: Conform/Kerlix - and coban Dressing Change Frequency: Wound #1 Left,Lateral Lower Leg: Change dressing every week Follow-up Appointments: Wound #1 Left,Lateral Lower Leg: Return Appointment in 1 week. Edema Control: Wound #1 Left,Lateral Lower Leg: Elevate legs to the level of the heart and pump ankles as often as possible Additional Orders / Instructions: Wound #1 Left,Lateral Lower Leg: Increase protein intake. OK to return to work with the following restrictions: Activity as tolerated Oxygen Administration: Wound #1 Left,Lateral Lower Leg: While patient is in clinic, provide supplimental oxygen via nasal cannula at liters/min __ : - 2liters Jarrard, Adaline [name]. ([id number]) #1 I'm going to continue Prisma under Kerlix Coban wraps this week #2 changed this Santyl next week #3 I have information from Dr. Driscilla Grammes office a. 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 He notes her ABIs remain normal at 1.2 bilaterally with brisk waveforms and normal visual pressures bilaterally. It does not appear that she has a significant vascular issue #4 the patient's  bleeding from her wound is very easy and quite perfuse I would like to see what her INR is Electronic Signature(s) Signed: 04/18/2016 7:56:34 AM By: Baltazar Najjar MD Entered By: Baltazar Najjar on 04/17/2016 10:28:30 Blomberg, Karma [name]. ([id number]) -------------------------------------------------------------------------------- SuperBill Details Patient Name: Strole, Halley [name]. Date of Service: 04/17/2016 Medical Record Patient Account Number: [id number] Number: Treating RN: Clover Mealy, RN, BSN, Rita July 14, 1946 [phone] y.o. Other Clinician: Date of Birth/Sex: Female) Treating ROBSON, MICHAEL Primary Care Physician/Extender: Comer Locket Physician: Weeks in Treatment: 1 Referring Physician: Fidel Levy Diagnosis Coding ICD-10 Codes Code Description [phone] Type 2 diabetes mellitus with other skin ulcer  E11.51 Type 2 diabetes mellitus with diabetic peripheral angiopathy without gangrene S81.832D Puncture wound without foreign body, left lower leg, subsequent encounter Facility Procedures CPT4 Code: 16109604 Description: 11042 - DEB SUBQ TISSUE 20 SQ CM/< ICD-10 Description Diagnosis E11.622 Type 2 diabetes mellitus with other skin ulcer Modifier: Quantity: 1 Physician Procedures CPT4 Code: 5409811 Description: 11042 - WC PHYS SUBQ TISS 20 SQ CM ICD-10 Description Diagnosis E11.622 Type 2 diabetes mellitus with other skin ulcer Modifier: Quantity: 1 Electronic Signature(s) Signed: 04/18/2016 7:56:34 AM By: Baltazar Najjar MD Entered By: Baltazar Najjar on 04/17/2016 91:47:82

## 2016-04-18 NOTE — Progress Notes (Signed)
Savannah, [name] ([id number]) Visit Report for 04/17/2016 Arrival Information Details Patient Name: [name], [name] Date of Service: 04/17/2016 10:00 AM Medical Record Patient Account Number: [id number] Number: Treating RN: Afful, RN, BSN, Rita Dec 04, 1945 [phone] y.o. Other Clinician: Date of Birth/Sex: Female) Treating ROBSON, MICHAEL Primary Care Physician: Fidel Levy Physician/Extender: G Referring Physician: Jones Broom in Treatment: 1 Visit Information History Since Last Visit Added or deleted any medications: No Patient Arrived: Wheel Chair Any new allergies or adverse reactions: No Arrival Time: 09:55 Had a fall or experienced change in No Accompanied By: sister activities of daily living that may affect Transfer Assistance: None risk of falls: Patient Identification Verified: Yes Signs or symptoms of abuse/neglect since last No Secondary Verification Process Yes visito Completed: Hospitalized since last visit: No Patient Requires Transmission- No Has Dressing in Place as Prescribed: Yes Based Precautions: Has Compression in Place as Prescribed: Yes Patient Has Alerts: Yes Pain Present Now: No Patient Alerts: Patient on Blood Thinner Warfarin Electronic Signature(s) Signed: 04/17/2016 2:19:20 PM By: Elpidio Eric BSN, RN Entered By: Elpidio Eric on 04/17/2016 09:55:28 Saha, Phyllicia H. ([id number]) -------------------------------------------------------------------------------- Encounter Discharge Information Details Patient Name: [name], [name] Date of Service: 04/17/2016 10:00 AM Medical Record Patient Account Number: [id number] Number: Treating RN: Afful, RN, BSN, Rita Aug 21, 1946 [phone] y.o. Other Clinician: Date of Birth/Sex: Female) Treating ROBSON, MICHAEL Primary Care Physician: Fidel Levy Physician/Extender: G Referring Physician: Jones Broom in Treatment: 1 Encounter Discharge Information Items Discharge Pain Level:  0 Discharge Condition: Stable Ambulatory Status: Wheelchair Discharge Destination: Home Transportation: Private Auto Accompanied By: sister Schedule Follow-up Appointment: No Medication Reconciliation completed No and provided to Patient/Care Verlee Pope: Provided on Clinical Summary of Care: 04/17/2016 Form Type Recipient Paper Patient IA Electronic Signature(s) Signed: 04/17/2016 10:31:37 AM By: Elpidio Eric BSN, RN Previous Signature: 04/17/2016 10:25:18 AM Version By: Gwenlyn Perking Entered By: Elpidio Eric on 04/17/2016 10:31:37 Callow, Hooria H. ([id number]) -------------------------------------------------------------------------------- Lower Extremity Assessment Details Patient Name: [name], [name] Date of Service: 04/17/2016 10:00 AM Medical Record Patient Account Number: [id number] Number: Treating RN: Afful, RN, BSN, Rita [birth year] [phone] y.o. Other Clinician: Date of Birth/Sex: Female) Treating ROBSON, MICHAEL Primary Care Physician: Fidel Levy Physician/Extender: G Referring Physician: Fidel Levy Weeks in Treatment: 1 Edema Assessment Assessed: [Left: No] [Right: No] E[Left: dema] [Right: :] Calf Left: Right: Point of Measurement: 30 cm From Medial Instep 32.1 cm cm Ankle Left: Right: Point of Measurement: 9 cm From Medial Instep 19.8 cm cm Vascular Assessment Claudication: Claudication Assessment [Left:None] Pulses: Posterior Tibial Dorsalis Pedis Palpable: [Left:Yes] Extremity colors, hair growth, and conditions: Extremity Color: [Left:Mottled] Hair Growth on Extremity: [Left:No] Temperature of Extremity: [Left:Warm] Capillary Refill: [Left:< 3 seconds] Electronic Signature(s) Signed: 04/17/2016 2:19:20 PM By: Elpidio Eric BSN, RN Entered By: Elpidio Eric on 04/17/2016 09:57:44 Jewkes, Laurana H. ([id number]) -------------------------------------------------------------------------------- Multi Wound Chart Details Patient Name: [name], [name] Date of  Service: 04/17/2016 10:00 AM Medical Record Patient Account Number: [id number] Number: Treating RN: Afful, RN, BSN, Rita 05/28/1946 [phone] y.o. Other Clinician: Date of Birth/Sex: Female) Treating ROBSON, MICHAEL Primary Care Physician: Fidel Levy Physician/Extender: G Referring Physician: Fidel Levy Weeks in Treatment: 1 Vital Signs Height(in): 62 Pulse(bpm): 79 Weight(lbs): 157 Blood Pressure 133/74 (mmHg): Body Mass Index(BMI): 29 Temperature(F): 98 Respiratory Rate 20 (breaths/min): Photos: [1:No Photos] [N/A:N/A] Wound Location: [1:Left Lower Leg - Lateral] [N/A:N/A] Wounding Event: [1:Trauma] [N/A:N/A] Primary Etiology: [1:Skin Tear] [N/A:N/A] Comorbid History: [1:Cataracts, Asthma, Chronic Obstructive  Pulmonary Disease (COPD), Arrhythmia, Hypertension, Peripheral Arterial Disease, Peripheral Venous Disease, Type II Diabetes] [N/A:N/A] Date Acquired: [1:02/22/2016] [N/A:N/A] Weeks of Treatment: [1:1] [N/A:N/A] Wound Status: [1:Open] [N/A:N/A] Measurements L x W x D 3.8x2.4x0.2 [N/A:N/A] (cm) Area (cm) : [1:7.163] [N/A:N/A] Volume (cm) : [1:1.433] [N/A:N/A] % Reduction in Area: [1:-14.00%] [N/A:N/A] % Reduction in Volume: -128.20% [N/A:N/A] Classification: [1:Partial Thickness] [N/A:N/A] HBO Classification: [1:Grade 1] [N/A:N/A] Exudate Amount: [1:Medium] [N/A:N/A] Exudate Type: [1:Serosanguineous] [N/A:N/A] Exudate Color: [1:red, brown] [N/A:N/A] Wound Margin: [1:Distinct, outline attached] [N/A:N/A] Granulation Amount: [1:Medium (34-66%)] [N/A:N/A] Necrotic Amount: Medium (34-66%) N/A N/A Exposed Structures: Fascia: No N/A N/A Fat: No Tendon: No Muscle: No Joint: No Bone: No Limited to Skin Breakdown Epithelialization: None N/A N/A Periwound Skin Texture: Edema: Yes N/A N/A Excoriation: No Induration: No Callus: No Crepitus: No Fluctuance: No Friable: No Rash: No Scarring: No Periwound Skin Moist: Yes N/A  N/A Moisture: Maceration: No Dry/Scaly: No Periwound Skin Color: Mottled: Yes N/A N/A Atrophie Blanche: No Cyanosis: No Ecchymosis: No Erythema: No Hemosiderin Staining: No Pallor: No Rubor: No Temperature: No Abnormality N/A N/A Tenderness on Yes N/A N/A Palpation: Wound Preparation: Ulcer Cleansing: N/A N/A Rinsed/Irrigated with Saline Topical Anesthetic Applied: Other: lidocaine 4% Treatment Notes Electronic Signature(s) Signed: 04/17/2016 2:19:20 PM By: Elpidio Eric BSN, RN Entered By: Elpidio Eric on 04/17/2016 10:10:15 Bencivenga, Hazely Rexene Edison ([id number]) -------------------------------------------------------------------------------- Multi-Disciplinary Care Plan Details Patient Name: Crocket, Aveena H. Date of Service: 04/17/2016 10:00 AM Medical Record Patient Account Number: [id number] Number: Treating RN: Afful, RN, BSN, Rita 1946-07-14 [phone] y.o. Other Clinician: Date of Birth/Sex: Female) Treating ROBSON, MICHAEL Primary Care Physician: Fidel Levy Physician/Extender: G Referring Physician: Jones Broom in Treatment: 1 Active Inactive Orientation to the Wound Care Program Nursing Diagnoses: Knowledge deficit related to the wound healing center program Goals: Patient/caregiver will verbalize understanding of the Wound Healing Center Program Date Initiated: 04/10/2016 Goal Status: Active Interventions: Provide education on orientation to the wound center Notes: Venous Leg Ulcer Nursing Diagnoses: Knowledge deficit related to disease process and management Potential for venous Insuffiency (use before diagnosis confirmed) Goals: Patient will maintain optimal edema control Date Initiated: 04/10/2016 Goal Status: Active Patient/caregiver will verbalize understanding of disease process and disease management Date Initiated: 04/10/2016 Goal Status: Active Verify adequate tissue perfusion prior to therapeutic compression application Date Initiated:  04/10/2016 Goal Status: Active Interventions: Compression as ordered Provide education on venous insufficiency Huckeby, Destenee H. ([id number]) Notes: Wound/Skin Impairment Nursing Diagnoses: Impaired tissue integrity Knowledge deficit related to smoking impact on wound healing Knowledge deficit related to ulceration/compromised skin integrity Goals: Patient/caregiver will verbalize understanding of skin care regimen Date Initiated: 04/10/2016 Goal Status: Active Ulcer/skin breakdown will have a volume reduction of 30% by week 4 Date Initiated: 04/10/2016 Goal Status: Active Ulcer/skin breakdown will have a volume reduction of 50% by week 8 Date Initiated: 04/10/2016 Goal Status: Active Ulcer/skin breakdown will have a volume reduction of 80% by week 12 Date Initiated: 04/10/2016 Goal Status: Active Ulcer/skin breakdown will heal within 14 weeks Date Initiated: 04/10/2016 Goal Status: Active Interventions: Assess patient/caregiver ability to obtain necessary supplies Assess ulceration(s) every visit Provide education on ulcer and skin care Treatment Activities: Skin care regimen initiated : 04/10/2016 Topical wound management initiated : 04/10/2016 Notes: Electronic Signature(s) Signed: 04/17/2016 2:19:20 PM By: Elpidio Eric BSN, RN Entered By: Elpidio Eric on 04/17/2016 10:10:05 Christoph, Donne H. ([id number]) -------------------------------------------------------------------------------- Pain Assessment Details Patient Name: Millman, Britanny H. Date of Service: 04/17/2016 10:00 AM Medical Record Patient Account Number: [id number] Number: Treating  RN: Clover Mealy, RN, BSN, Iola Sink June 18, 1946 [phone] y.o. Other Clinician: Date of Birth/Sex: Female) Treating ROBSON, MICHAEL Primary Care Physician: Fidel Levy Physician/Extender: G Referring Physician: Fidel Levy Weeks in Treatment: 1 Active Problems Location of Pain Severity and Description of Pain Patient Has Paino No Site  Locations With Dressing Change: No Pain Management and Medication Current Pain Management: Electronic Signature(s) Signed: 04/17/2016 2:19:20 PM By: Elpidio Eric BSN, RN Entered By: Elpidio Eric on 04/17/2016 09:55:35 Yokum, Catera H. ([id number]) -------------------------------------------------------------------------------- Patient/Caregiver Education Details Patient Name: Archibald, Shantina H. Date of Service: 04/17/2016 10:00 AM Medical Record Patient Account Number: [id number] Number: Treating RN: Afful, RN, BSN, Rita December 01, 1945 [phone] y.o. Other Clinician: Date of Birth/Gender: Female) Treating ROBSON, MICHAEL Primary Care Physician: Fidel Levy Physician/Extender: G Referring Physician: Jones Broom in Treatment: 1 Education Assessment Education Provided To: Patient Education Topics Provided Venous: Methods: Explain/Verbal Responses: State content correctly Welcome To The Wound Care Center: Methods: Explain/Verbal Responses: State content correctly Wound/Skin Impairment: Methods: Explain/Verbal Responses: State content correctly Electronic Signature(s) Signed: 04/17/2016 10:31:54 AM By: Elpidio Eric BSN, RN Entered By: Elpidio Eric on 04/17/2016 10:31:53 Beza, Talia H. ([id number]) -------------------------------------------------------------------------------- Wound Assessment Details Patient Name: Metzgar, Shanikqua H. Date of Service: 04/17/2016 10:00 AM Medical Record Patient Account Number: [id number] Number: Treating RN: Afful, RN, BSN, Rita 04/06/1946 [phone] y.o. Other Clinician: Date of Birth/Sex: Female) Treating ROBSON, MICHAEL Primary Care Physician: Fidel Levy Physician/Extender: G Referring Physician: Fidel Levy Weeks in Treatment: 1 Wound Status Wound Number: 1 Primary Skin Tear Etiology: Wound Location: Left Lower Leg - Lateral Wound Open Wounding Event: Trauma Status: Date Acquired: 02/22/2016 Comorbid Cataracts, Asthma,  Chronic Obstructive Weeks Of Treatment: 1 History: Pulmonary Disease (COPD), Arrhythmia, Clustered Wound: No Hypertension, Peripheral Arterial Disease, Peripheral Venous Disease, Type II Diabetes Photos Photo Uploaded By: Elpidio Eric on 04/17/2016 14:17:19 Wound Measurements Length: (cm) 3.8 Width: (cm) 2.4 Depth: (cm) 0.2 Area: (cm) 7.163 Volume: (cm) 1.433 % Reduction in Area: -14% % Reduction in Volume: -128.2% Epithelialization: None Tunneling: No Undermining: No Wound Description Classification: Partial Thickness Diabetic Severity (Wagner): Grade 1 Wound Margin: Distinct, outline attache Exudate Amount: Medium Exudate Type: Serosanguineous Exudate Color: red, brown Kerney, Tyesha H. ([id number]) Foul Odor After Cleansing: No d Wound Bed Granulation Amount: Medium (34-66%) Exposed Structure Necrotic Amount: Medium (34-66%) Fascia Exposed: No Necrotic Quality: Adherent Slough Fat Layer Exposed: No Tendon Exposed: No Muscle Exposed: No Joint Exposed: No Bone Exposed: No Limited to Skin Breakdown Periwound Skin Texture Texture Color No Abnormalities Noted: No No Abnormalities Noted: No Callus: No Atrophie Blanche: No Crepitus: No Cyanosis: No Excoriation: No Ecchymosis: No Fluctuance: No Erythema: No Friable: No Hemosiderin Staining: No Induration: No Mottled: Yes Localized Edema: Yes Pallor: No Rash: No Rubor: No Scarring: No Temperature / Pain Moisture Temperature: No Abnormality No Abnormalities Noted: No Tenderness on Palpation: Yes Dry / Scaly: No Maceration: No Moist: Yes Wound Preparation Ulcer Cleansing: Rinsed/Irrigated with Saline Topical Anesthetic Applied: Other: lidocaine 4%, Treatment Notes Wound #1 (Left, Lateral Lower Leg) 1. Cleansed with: Cleanse wound with antibacterial soap and water 3. Peri-wound Care: Barrier cream Moisturizing lotion 4. Dressing Applied: Prisma Ag 5. Secondary Dressing Applied Dry Gauze 7.  Secured with Other (specify in notes) Notes Rylee, Lashawnda H. ([id number]) Kerlix and Crissie Sickles Electronic Signature(s) Signed: 04/17/2016 2:19:20 PM By: Elpidio Eric BSN, RN Entered By: Elpidio Eric on 04/17/2016 10:05:57 Velasquez, Demetra H. ([id number]) -------------------------------------------------------------------------------- Vitals Details Patient Name: Pieri, Caty H. Date of Service: 04/17/2016 10:00  AM Medical Record Patient Account Number: [id number] Number: Treating RN: Afful, RN, BSN, Saticoy Sinkita 24-Jan-1946 [phone](70 y.o. Other Clinician: Date of Birth/Sex: Female) Treating ROBSON, MICHAEL Primary Care Physician: Fidel LevyHAWKINS JR, JAMES Physician/Extender: G Referring Physician: Jones BroomHAWKINS JR, JAMES Weeks in Treatment: 1 Vital Signs Time Taken: 09:57 Temperature (F): 98 Height (in): 62 Pulse (bpm): 79 Weight (lbs): 157 Respiratory Rate (breaths/min): 20 Body Mass Index (BMI): 28.7 Blood Pressure (mmHg): 133/74 Reference Range: 80 - 120 mg / dl Electronic Signature(s) Signed: 04/17/2016 2:19:20 PM By: Elpidio EricAfful, Rita BSN, RN Entered By: Elpidio EricAfful, Rita on 04/17/2016 09:58:08

## 2016-04-24 ENCOUNTER — Encounter: Payer: Medicare Other | Admitting: Internal Medicine

## 2016-04-24 DIAGNOSIS — E11622 Type 2 diabetes mellitus with other skin ulcer: Secondary | ICD-10-CM | POA: Diagnosis not present

## 2016-04-24 DIAGNOSIS — H9190 Unspecified hearing loss, unspecified ear: Secondary | ICD-10-CM | POA: Diagnosis not present

## 2016-04-24 DIAGNOSIS — J449 Chronic obstructive pulmonary disease, unspecified: Secondary | ICD-10-CM | POA: Diagnosis not present

## 2016-04-24 DIAGNOSIS — E1151 Type 2 diabetes mellitus with diabetic peripheral angiopathy without gangrene: Secondary | ICD-10-CM | POA: Diagnosis not present

## 2016-04-24 DIAGNOSIS — J45909 Unspecified asthma, uncomplicated: Secondary | ICD-10-CM | POA: Diagnosis not present

## 2016-04-24 DIAGNOSIS — S81832D Puncture wound without foreign body, left lower leg, subsequent encounter: Secondary | ICD-10-CM | POA: Diagnosis not present

## 2016-04-24 DIAGNOSIS — Z87891 Personal history of nicotine dependence: Secondary | ICD-10-CM | POA: Diagnosis not present

## 2016-04-24 DIAGNOSIS — I10 Essential (primary) hypertension: Secondary | ICD-10-CM | POA: Diagnosis not present

## 2016-04-24 DIAGNOSIS — Z9981 Dependence on supplemental oxygen: Secondary | ICD-10-CM | POA: Diagnosis not present

## 2016-04-24 DIAGNOSIS — I739 Peripheral vascular disease, unspecified: Secondary | ICD-10-CM | POA: Diagnosis not present

## 2016-04-24 DIAGNOSIS — S81812A Laceration without foreign body, left lower leg, initial encounter: Secondary | ICD-10-CM | POA: Diagnosis not present

## 2016-04-24 NOTE — Progress Notes (Signed)
Savannah FaithDAMS, [name] ([id number]) Visit Report for 04/24/2016 Chief Complaint Document Details Patient Name: [name], [name] Date of Service: 04/24/2016 10:00 AM Medical Record Patient Account Number: [id number] Number: Treating [name]: [name], BSN, [place]el [name] 10/31/1946 [phone](70 y.o. Other Clinician: Date of Birth/Sex: Female) Treating [name] Primary Care Physician/Extender: [name] Physician: Referring Physician: Jones BroomHAWKINS JR, [name] Weeks in Treatment: 2 Information Obtained from: Patient Chief Complaint Patient is here for a nonhealing wound on her left lateral leg which is been present for a month Electronic Signature(s) Signed: 04/24/2016 4:35:27 PM By: [name] Entered By: Savannah Najjarobson, [name] on 04/24/2016 10:47:32 [name], [name] ([id number]) -------------------------------------------------------------------------------- Debridement Details Patient Name: [name], [name] Date of Service: 04/24/2016 10:00 AM Medical Record Patient Account Number: [id number] Number: Treating [name]: [name], BSN, [name] 10/31/1946 [phone](70 y.o. Other Clinician: Date of Birth/Sex: Female) Treating [name] Primary Care Physician/Extender: [name] Physician: Referring Physician: Jones BroomHAWKINS JR, [name] Weeks in Treatment: 2 Debridement Performed for Wound #1 Left,Lateral Lower Leg Assessment: Performed By: Physician [name] Debridement: Debridement Pre-procedure Yes Verification/Time Out Taken: Start Time: 10:20 Pain Control: Lidocaine 4% Topical Solution Level: Skin/Subcutaneous Tissue Total Area Debrided (L x 4 (cm) x 3 (cm) = 12 (cm) W): Tissue and other Non-Viable, Fibrin/Slough, Subcutaneous material debrided: Instrument: Curette Bleeding: Minimum Hemostasis Achieved: Pressure End Time: 10:25 Procedural Pain: 0 Post Procedural Pain: 0 Response to Treatment: Procedure was tolerated well Post Debridement Measurements of Total  Wound Length: (cm) 4 Width: (cm) 3 Depth: (cm) 0.1 Volume: (cm) 0.942 Post Procedure Diagnosis Same as Pre-procedure Electronic Signature(s) Signed: 04/24/2016 4:35:27 PM By: [name] Signed: 04/24/2016 4:40:39 PM By: [name] Entered By: Savannah Najjarobson, [name] on 04/24/2016 10:45:55 [name], [name] [name], [name] ([id number]) -------------------------------------------------------------------------------- HPI Details Patient Name: [name], [name] Date of Service: 04/24/2016 10:00 AM Medical Record Patient Account Number: [id number] Number: Treating [name]: [name], BSN,  [name] 10/31/1946 [phone](70 y.o. Other Clinician: Date of Birth/Sex: Female) Treating [name] Primary Care Physician/Extender: [name] Physician: Referring Physician: Jones BroomHAWKINS JR, [name] Weeks in Treatment: 2 History of Present Illness HPI Description: 04/10/16; this is a 70 year old woman who lives in [place] next to a sister who helps care for her. She traumatized her left leg roughly a month ago on the bed frame of a hospital bed she has at home. I description predominantly of her sister it sounds as though she had a laceration with a skin flap. The skin did not hold together and it came off the surface of the wound. She was seen by her primary care physician last week who noted a black eschar on the top of the wound. Prescribed a topical cream as well as cephalexin for erythema around the wound. It sounds as though the black eschar came off the wound leaving an open wound on the left leg. She has been using Neosporin as a primary wound dressing for most of the last month. She does not have a prior wound history. The patient is followed by Dr. Wyn Quakerew vascular surgery and it sounds as though at some point she had a procedure on her left leg possibly a stent. ABI in this clinic was 0.9 on the left with monophasic waveforms. She is a diabetic with last hemoglobin A1c at 6.8 on  03/19/16. She does not have a known history of neuropathy 04/17/16; no major change in the condition of this wound on her left anterior lateral  leg. There is mild erythema but no tenderness and I don't think there is any evidence of infection area we still don't have Dr. Kristeen Mans vascular surgery notes 04/24/16; no major change in the condition of this wound on the left anterior leg. Initially trauma. Not felt to have a significant arterial component by vascular surgery. Electronic Signature(s) Signed: 04/24/2016 4:35:27 PM By: Savannah Najjar [name] Entered By: Savannah Najjar on 04/24/2016 10:50:28 [name] ([id number]) -------------------------------------------------------------------------------- Physical Exam Details Patient Name: [name], [name] Date of Service: 04/24/2016 10:00 AM Medical Record Patient Account Number: [id number] Number: Treating [name]: [name], BSN, [place] Sink October 02, 1946 [phone] y.o. Other Clinician: Date of Birth/Sex: Female) Treating [name] Primary Care Physician/Extender: Savannah Locket Physician: Referring Physician: Jones Broom in Treatment: 2 Constitutional Sitting or standing Blood Pressure is within target range for patient.. Pulse regular and within target range for patient.. Temperature is normal and within the target range for the patient.. No change in height.. Cardiovascular Pedal pulses palpable and strong bilaterally.. Notes Wound exam; a deep wound was still not a viable surface subcutaneous fat. No real surface that we'll support healing to this point. Erythema around the wound is mild and I don't think there is significant infection Electronic Signature(s) Signed: 04/24/2016 4:35:27 PM By: Savannah Najjar [name] Entered By: Savannah Najjar on 04/24/2016 10:51:46 [name], Camary HMarland Kitchen ([id number]) -------------------------------------------------------------------------------- Physician Orders Details Patient Name: Smet, Rainna [name]. Date  of Service: 04/24/2016 10:00 AM Medical Record Patient Account Number: [id number] Number: Treating [name]: [name], BSN, [place] Sink 01-Mar-1946 [phone] y.o. Other Clinician: Date of Birth/Sex: Female) Treating [name] Primary Care Physician/Extender: Savannah Locket Physician: Referring Physician: Jones Broom in Treatment: 2 Verbal / Phone Orders: Yes Clinician: Afful, [name], BSN, [name] Read Back and Verified: Yes Diagnosis Coding Wound Cleansing Wound #1 Left,Lateral Lower Leg o May shower with protection. o No tub bath. o Other: - DO not remove dressing Anesthetic Wound #1 Left,Lateral Lower Leg o Topical Lidocaine 4% cream applied to wound bed prior to debridement Skin Barriers/Peri-Wound Care Wound #1 Left,Lateral Lower Leg o Barrier cream Primary Wound Dressing Wound #1 Left,Lateral Lower Leg o Iodoflex Secondary Dressing Wound #1 Left,Lateral Lower Leg o Conform/Kerlix - and coban Dressing Change Frequency Wound #1 Left,Lateral Lower Leg o Change dressing every week Follow-up Appointments Wound #1 Left,Lateral Lower Leg o Return Appointment in 1 week. Edema Control Wound #1 Left,Lateral Lower Leg [name], [name] ([id number]) o Elevate legs to the level of the heart and pump ankles as often as possible Additional Orders / Instructions Wound #1 Left,Lateral Lower Leg o Increase protein intake. o OK to return to work with the following restrictions: o Activity as tolerated Oxygen Administration Wound #1 Left,Lateral Lower Leg o While patient is in clinic, provide supplimental oxygen via nasal cannula at liters/min __ : - 2liters Electronic Signature(s) Signed: 04/24/2016 4:35:27 PM By: Savannah Najjar [name] Signed: 04/24/2016 4:40:39 PM By: Savannah Eric BSN, [name] Entered By: Savannah Eric on 04/24/2016 10:26:48 [name], [name] ([id number]) -------------------------------------------------------------------------------- Problem  List Details Patient Name: Hollander, Navaeh [name]. Date of Service: 04/24/2016 10:00 AM Medical Record Patient Account Number: [id number] Number: Treating [name]: [name], BSN, [place] Sink 1946/06/05 [phone] y.o. 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 -------------------------------------------------------------------------------- Progress Note Details Patient Name: [name], [name] Date of Service: 04/24/2016 10:00 AM Medical Record Patient Account Number: [id number] Number: Treating [name]: [name], BSN, [place] Sink 01-Jan-1946 [phone] y.o. Other Clinician: Date of Birth/Sex: Female) Treating [name] Primary Care Physician/Extender: Savannah Locket Physician: Referring Physician: Jones Broom in Treatment: 2 Subjective Chief Complaint Information obtained from Patient Patient is here for a nonhealing wound on her left lateral leg which is been present for a month History of Present Illness (HPI) 04/10/16; this is a 70 year old woman who lives in [place] next to a sister who helps care for her. She traumatized her left leg roughly a month ago on the bed frame of a hospital bed she has at home. I description predominantly of her sister it sounds as though she had a laceration with a skin flap. The  skin did not hold together and it came off the surface of the wound. She was seen by her primary care physician last week who noted a black eschar on the top of the wound. Prescribed a topical cream as well as cephalexin for erythema around the wound. It sounds as though the black eschar came off the wound leaving an open wound on the left leg. She has been using Neosporin as a primary wound dressing for most of the last month. She does not have a prior wound history. The patient is followed by Dr. Wyn Quaker vascular surgery and it sounds as though at some point she had a procedure on her left leg possibly a stent. ABI in this clinic was 0.9 on the left with monophasic waveforms. She is a diabetic with last hemoglobin A1c at 6.8 on 03/19/16. She does not have a known history of neuropathy 04/17/16; no major change in the condition of this wound on her left anterior lateral leg. There is mild erythema but no tenderness and I don't think there is any evidence of infection area we still don't have Dr. Kristeen Mans vascular surgery notes 04/24/16; no major change in the condition of this wound on the left anterior leg. Initially trauma. Not felt to have a significant arterial component by vascular surgery. Objective Constitutional Sitting or standing Blood Pressure is within target range for patient.. Pulse regular and within target range for patient.. Temperature is normal and within the target range for the patient.. No change in height.Pernell [name], Savannah Rexene Edison ([id number]) Vitals Time Taken: 10:05 AM, Height: 62 in, Weight: 157 lbs, BMI: 28.7, Temperature: 97.9 F, Pulse: 84 bpm, Respiratory Rate: 18 breaths/min, Blood Pressure: 145/79 mmHg. Cardiovascular Pedal pulses palpable and strong bilaterally.. General Notes: Wound exam; a deep wound was still not a viable surface subcutaneous fat. No real surface that we'll support healing to this point. Erythema around the wound is mild and I don't think there is significant  infection Integumentary (Hair, Skin) Wound #1 status is Open. Original cause of wound was Trauma. The wound is located on the Left,Lateral Lower Leg. The wound measures 4cm length x 3cm width x 0.2cm depth; 9.425cm^2 area and 1.885cm^3 volume. The wound is limited to skin breakdown. There is no tunneling or undermining noted. There is a medium amount of serosanguineous drainage noted.  The wound margin is distinct with the outline attached to the wound base. There is medium (34-66%) granulation within the wound bed. There is a medium (34- 66%) amount of necrotic tissue within the wound bed including Adherent Slough. The periwound skin appearance exhibited: Localized Edema, Moist, Mottled. The periwound skin appearance did not exhibit: Callus, Crepitus, Excoriation, Fluctuance, Friable, Induration, Rash, Scarring, Dry/Scaly, Maceration, Atrophie Blanche, Cyanosis, Ecchymosis, Hemosiderin Staining, Pallor, Rubor, Erythema. Periwound temperature was noted as No Abnormality. The periwound has tenderness on palpation. 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 There was a Skin/Subcutaneous Tissue Debridement ([id number]) debridement with total area of 12 sq cm performed by Savannah Caul, [name]. with the following instrument(s): Curette to remove Non-Viable tissue/material including Fibrin/Slough and Subcutaneous after achieving pain control using Lidocaine 4% Topical Solution. A time out was conducted prior to the start of the procedure. A Minimum amount of bleeding was controlled with Pressure. Zundel, Debara [name]. ([id number]) The procedure was tolerated well with a pain level of 0 throughout and a  pain level of 0 following the procedure. Post Debridement Measurements: 4cm length x 3cm width x 0.1cm depth; 0.942cm^3 volume. Post procedure Diagnosis Wound #1: Same as Pre-Procedure Plan Wound Cleansing: Wound #1 Left,Lateral Lower Leg: May shower with protection. No tub bath. Other: - DO not remove dressing Anesthetic: Wound #1 Left,Lateral Lower Leg: Topical Lidocaine 4% cream applied to wound bed prior to debridement Skin Barriers/Peri-Wound Care: Wound #1 Left,Lateral Lower Leg: Barrier cream Primary Wound Dressing: Wound #1 Left,Lateral Lower Leg: Iodoflex Secondary Dressing: Wound #1 Left,Lateral Lower Leg: Conform/Kerlix - and coban Dressing Change Frequency: Wound #1 Left,Lateral Lower Leg: Change dressing every week Follow-up Appointments: Wound #1 Left,Lateral Lower Leg: Return Appointment in 1 week. Edema Control: Wound #1 Left,Lateral Lower Leg: Elevate legs to the level of the heart and pump ankles as often as possible Additional Orders / Instructions: Wound #1 Left,Lateral Lower Leg: Increase protein intake. OK to return to work with the following restrictions: Activity as tolerated Oxygen Administration: Wound #1 Left,Lateral Lower Leg: While patient is in clinic, provide supplimental oxygen via nasal cannula at liters/min __ : - 2liters Rochelle, Dorothee [name]. ([id number]) change to iodoflex. a deep wound with a continued non viable surface Electronic Signature(s) Signed: 04/24/2016 4:35:27 PM By: Savannah Najjar [name] Entered By: Savannah Najjar on 04/24/2016 10:52:32 Blasko, Rockelle [name]. ([id number]) -------------------------------------------------------------------------------- SuperBill Details Patient Name: Morr, Pang [name]. Date of Service: 04/24/2016 Medical Record Patient Account Number: [id number] Number: Treating [name]: Clover Mealy, [name], BSN, [name] [birth year] [phone] y.o. 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 S81.832D Puncture wound without foreign body, left lower leg, subsequent encounter Facility Procedures CPT4 Code: [id number] Description: 11042 - DEB SUBQ TISSUE 20 SQ CM/< ICD-10 Description Diagnosis E11.622 Type 2 diabetes mellitus with other skin ulcer Modifier: Quantity: 1 Physician Procedures CPT4 Code: [id number] Description: 11042 - WC PHYS SUBQ TISS 20 SQ CM ICD-10 Description Diagnosis E11.622 Type 2 diabetes mellitus with other skin ulcer  Modifier: Quantity: 1 Electronic Signature(s) Signed: 04/24/2016 4:35:27 PM By: Savannah Najjar [name] Entered By: Savannah Najjar on 04/24/2016 10:53:06

## 2016-04-24 NOTE — Progress Notes (Signed)
DARNELL, STIMSON ([id number]) Visit Report for 04/24/2016 Arrival Information Details Patient Name: Pinkerton, ANALISIA KINGSFORD. Date of Service: 04/24/2016 10:00 AM Medical Record Patient Account Number: [id number] Number: Treating RN: Afful, RN, BSN, [place] Sink 20-Jun-1946 [phone] y.o. Other Clinician: Date of Birth/Sex: Female) Treating ROBSON, MICHAEL Primary Care Physician: Fidel Levy Physician/Extender: G Referring Physician: Jones Broom in Treatment: 2 Visit Information History Since Last Visit Added or deleted any medications: No Patient Arrived: Wheel Chair Any new allergies or adverse reactions: No Arrival Time: 09:59 Had a fall or experienced change in No Accompanied By: sister activities of daily living that may affect Transfer Assistance: None risk of falls: Patient Identification Verified: Yes Signs or symptoms of abuse/neglect since last No Secondary Verification Process Yes visito Completed: Hospitalized since last visit: No Patient Requires Transmission- No Has Dressing in Place as Prescribed: Yes Based Precautions: Has Compression in Place as Prescribed: Yes Patient Has Alerts: Yes Pain Present Now: Yes Patient Alerts: Patient on Blood Thinner Warfarin Electronic Signature(s) Signed: 04/24/2016 4:40:39 PM By: Elpidio Eric BSN, RN Entered By: Elpidio Eric on 04/24/2016 10:04:17 Ibach, Imajean H. ([id number]) -------------------------------------------------------------------------------- Encounter Discharge Information Details Patient Name: Kanode, Abygayle H. Date of Service: 04/24/2016 10:00 AM Medical Record Patient Account Number: [id number] Number: Treating RN: Afful, RN, BSN, [place] Sink 04/03/1946 [phone] y.o. Other Clinician: Date of Birth/Sex: Female) Treating ROBSON, MICHAEL Primary Care Physician: Fidel Levy Physician/Extender: G Referring Physician: Jones Broom in Treatment: 2 Encounter Discharge Information Items Discharge Pain  Level: 0 Discharge Condition: Stable Ambulatory Status: Wheelchair Discharge Destination: Home Transportation: Private Auto Accompanied By: sis Schedule Follow-up Appointment: No Medication Reconciliation completed and provided to Patient/Care No Remijio Holleran: Provided on Clinical Summary of Care: 04/24/2016 Form Type Recipient Paper Patient IA Electronic Signature(s) Signed: 04/24/2016 10:38:33 AM By: Gwenlyn Perking Entered By: Gwenlyn Perking on 04/24/2016 10:38:33 Weger, Kaelee H. ([id number]) -------------------------------------------------------------------------------- Lower Extremity Assessment Details Patient Name: Selvy, Latonga H. Date of Service: 04/24/2016 10:00 AM Medical Record Patient Account Number: [id number] Number: Treating RN: Afful, RN, BSN, [place] Sink 1946/10/10 [phone] y.o. Other Clinician: Date of Birth/Sex: Female) Treating ROBSON, MICHAEL Primary Care Physician: Fidel Levy Physician/Extender: G Referring Physician: Fidel Levy Weeks in Treatment: 2 Edema Assessment Assessed: [Left: No] [Right: No] Edema: [Left: N] [Right: o] Calf Left: Right: Point of Measurement: 30 cm From Medial Instep 32 cm cm Ankle Left: Right: Point of Measurement: 9 cm From Medial Instep 19.8 cm cm Vascular Assessment Claudication: Claudication Assessment [Left:None] Pulses: Posterior Tibial Dorsalis Pedis Palpable: [Left:Yes] Extremity colors, hair growth, and conditions: Extremity Color: [Left:Mottled] Hair Growth on Extremity: [Left:No] Temperature of Extremity: [Left:Warm] Capillary Refill: [Left:< 3 seconds] Toe Nail Assessment Left: Right: Thick: Yes Discolored: Yes Deformed: No Improper Length and Hygiene: No Electronic Signature(s) Signed: 04/24/2016 4:40:39 PM By: Elpidio Eric BSN, RN Siefken, Sherene H. ([id number]) Entered By: Elpidio Eric on 04/24/2016 10:06:04 Munns, Randy H.  ([id number]) -------------------------------------------------------------------------------- Multi Wound Chart Details Patient Name: Buendia, Taniya H. Date of Service: 04/24/2016 10:00 AM Medical Record Patient Account Number: [id number] Number: Treating RN: Afful, RN, BSN, [place] Sink 1946/02/04 [phone] y.o. Other Clinician: Date of Birth/Sex: Female) Treating ROBSON, MICHAEL Primary Care Physician: Fidel Levy Physician/Extender: G Referring Physician: Fidel Levy Weeks in Treatment: 2 Vital Signs Height(in): 62 Pulse(bpm): 84 Weight(lbs): 157 Blood Pressure 145/79 (mmHg): Body Mass Index(BMI): 29 Temperature(F): 97.9 Respiratory Rate 18 (breaths/min): Photos: [1:No Photos] [N/A:N/A] Wound Location: [1:Left Lower Leg - Lateral] [N/A:N/A] Wounding Event: [1:Trauma] [N/A:N/A] Primary  Etiology: [1:Skin Tear] [N/A:N/A] Comorbid History: [1:Cataracts, Asthma, Chronic Obstructive Pulmonary Disease (COPD), Arrhythmia, Hypertension, Peripheral Arterial Disease, Peripheral Venous Disease, Type II Diabetes] [N/A:N/A] Date Acquired: [1:02/22/2016] [N/A:N/A] Weeks of Treatment: [1:2] [N/A:N/A] Wound Status: [1:Open] [N/A:N/A] Measurements L x W x D 4x3x0.2 [N/A:N/A] (cm) Area (cm) : [1:9.425] [N/A:N/A] Volume (cm) : [1:1.885] [N/A:N/A] % Reduction in Area: [1:-50.00%] [N/A:N/A] % Reduction in Volume: -200.20% [N/A:N/A] Classification: [1:Partial Thickness] [N/A:N/A] HBO Classification: [1:Grade 1] [N/A:N/A] Exudate Amount: [1:Medium] [N/A:N/A] Exudate Type: [1:Serosanguineous] [N/A:N/A] Exudate Color: [1:red, brown] [N/A:N/A] Wound Margin: [1:Distinct, outline attached] [N/A:N/A] Granulation Amount: [1:Medium (34-66%)] [N/A:N/A] Necrotic Amount: Medium (34-66%) N/A N/A Exposed Structures: Fascia: No N/A N/A Fat: No Tendon: No Muscle: No Joint: No Bone: No Limited to Skin Breakdown Epithelialization: None N/A N/A Periwound Skin Texture: Edema: Yes N/A  N/A Excoriation: No Induration: No Callus: No Crepitus: No Fluctuance: No Friable: No Rash: No Scarring: No Periwound Skin Moist: Yes N/A N/A Moisture: Maceration: No Dry/Scaly: No Periwound Skin Color: Mottled: Yes N/A N/A Atrophie Blanche: No Cyanosis: No Ecchymosis: No Erythema: No Hemosiderin Staining: No Pallor: No Rubor: No Temperature: No Abnormality N/A N/A Tenderness on Yes N/A N/A Palpation: Wound Preparation: Ulcer Cleansing: N/A N/A Rinsed/Irrigated with Saline Topical Anesthetic Applied: Other: lidocaine 4% Treatment Notes Electronic Signature(s) Signed: 04/24/2016 4:40:39 PM By: Elpidio Eric BSN, RN Entered By: Elpidio Eric on 04/24/2016 10:07:51 Borum, Fontaine Rexene Edison ([id number]) -------------------------------------------------------------------------------- Multi-Disciplinary Care Plan Details Patient Name: Reddoch, Sharlyne H. Date of Service: 04/24/2016 10:00 AM Medical Record Patient Account Number: [id number] Number: Treating RN: Afful, RN, BSN, [place] Sink 11/21/1945 [phone] y.o. Other Clinician: Date of Birth/Sex: Female) Treating ROBSON, MICHAEL Primary Care Physician: Fidel Levy Physician/Extender: G Referring Physician: Jones Broom in Treatment: 2 Active Inactive Orientation to the Wound Care Program Nursing Diagnoses: Knowledge deficit related to the wound healing center program Goals: Patient/caregiver will verbalize understanding of the Wound Healing Center Program Date Initiated: 04/10/2016 Goal Status: Active Interventions: Provide education on orientation to the wound center Notes: Venous Leg Ulcer Nursing Diagnoses: Knowledge deficit related to disease process and management Potential for venous Insuffiency (use before diagnosis confirmed) Goals: Patient will maintain optimal edema control Date Initiated: 04/10/2016 Goal Status: Active Patient/caregiver will verbalize understanding of disease process and disease  management Date Initiated: 04/10/2016 Goal Status: Active Verify adequate tissue perfusion prior to therapeutic compression application Date Initiated: 04/10/2016 Goal Status: Active Interventions: Compression as ordered Provide education on venous insufficiency Salome, Thailyn H. ([id number]) Notes: Wound/Skin Impairment Nursing Diagnoses: Impaired tissue integrity Knowledge deficit related to smoking impact on wound healing Knowledge deficit related to ulceration/compromised skin integrity Goals: Patient/caregiver will verbalize understanding of skin care regimen Date Initiated: 04/10/2016 Goal Status: Active Ulcer/skin breakdown will have a volume reduction of 30% by week 4 Date Initiated: 04/10/2016 Goal Status: Active Ulcer/skin breakdown will have a volume reduction of 50% by week 8 Date Initiated: 04/10/2016 Goal Status: Active Ulcer/skin breakdown will have a volume reduction of 80% by week 12 Date Initiated: 04/10/2016 Goal Status: Active Ulcer/skin breakdown will heal within 14 weeks Date Initiated: 04/10/2016 Goal Status: Active Interventions: Assess patient/caregiver ability to obtain necessary supplies Assess ulceration(s) every visit Provide education on ulcer and skin care Treatment Activities: Skin care regimen initiated : 04/10/2016 Topical wound management initiated : 04/10/2016 Notes: Electronic Signature(s) Signed: 04/24/2016 4:40:39 PM By: Elpidio Eric BSN, RN Entered By: Elpidio Eric on 04/24/2016 10:07:45 Pelham, Jill H. ([id number]) -------------------------------------------------------------------------------- Pain Assessment Details Patient Name: Gaultney, Allure H. Date of Service: 04/24/2016 10:00  AM Medical Record Patient Account Number: [id number] Number: Treating RN: Afful, RN, BSN, [place] Sink [birth year] [phone] y.o. Other Clinician: Date of Birth/Sex: Female) Treating ROBSON, MICHAEL Primary Care Physician: Fidel Levy Physician/Extender: G Referring  Physician: Jones Broom in Treatment: 2 Active Problems Location of Pain Severity and Description of Pain Patient Has Paino Yes Site Locations Pain Location: Pain in Ulcers Duration of the Pain. Constant / Intermittento Intermittent Rate the pain. Current Pain Level: 4 Worst Pain Level: 6 Character of Pain Describe the Pain: Burning, Shooting, Tender Pain Management and Medication Current Pain Management: Medication: Yes Is the Current Pain Management Inadequate Adequate: Rest: Yes How does your pain impact your activities of daily livingo Sleep: Yes Bathing: Yes Appetite: Yes Relationship With Others: Yes Bladder Continence: Yes Emotions: Yes Bowel Continence: Yes Work: Yes Toileting: Yes Drive: Yes Dressing: Yes Hobbies: Yes Electronic Signature(s) Signed: 04/24/2016 4:40:39 PM By: Elpidio Eric BSN, RN Entered By: Elpidio Eric on 04/24/2016 10:04:45 Haning, Ishita H. ([id number]) Hufnagle, Alveda H. ([id number]) -------------------------------------------------------------------------------- Patient/Caregiver Education Details Patient Name: Hemsley, Eleanora H. Date of Service: 04/24/2016 10:00 AM Medical Record Patient Account Number: [id number] Number: Treating RN: Afful, RN, BSN, [place] Sink Nov 01, 1946 [phone] y.o. Other Clinician: Date of Birth/Gender: Female) Treating ROBSON, MICHAEL Primary Care Physician: Fidel Levy Physician/Extender: G Referring Physician: Jones Broom in Treatment: 2 Education Assessment Education Provided To: Patient Education Topics Provided Basic Hygiene: Methods: Explain/Verbal Responses: State content correctly Venous: Methods: Explain/Verbal Responses: State content correctly Welcome To The Wound Care Center: Methods: Explain/Verbal Responses: State content correctly Wound/Skin Impairment: Methods: Explain/Verbal Responses: State content correctly Electronic Signature(s) Signed: 04/24/2016 4:40:39 PM By: Elpidio Eric BSN, RN Entered By: Elpidio Eric on 04/24/2016 10:28:29 Besson, Journi H. ([id number]) -------------------------------------------------------------------------------- Wound Assessment Details Patient Name: Rund, Natilie H. Date of Service: 04/24/2016 10:00 AM Medical Record Patient Account Number: [id number] Number: Treating RN: Afful, RN, BSN, [place] Sink [birth year] [phone] y.o. Other Clinician: Date of Birth/Sex: Female) Treating ROBSON, MICHAEL Primary Care Physician: Fidel Levy Physician/Extender: G Referring Physician: Fidel Levy Weeks in Treatment: 2 Wound Status Wound Number: 1 Primary Skin Tear Etiology: Wound Location: Left Lower Leg - Lateral Wound Open Wounding Event: Trauma Status: Date Acquired: 02/22/2016 Comorbid Cataracts, Asthma, Chronic Obstructive Weeks Of Treatment: 2 History: Pulmonary Disease (COPD), Arrhythmia, Clustered Wound: No Hypertension, Peripheral Arterial Disease, Peripheral Venous Disease, Type II Diabetes Photos Photo Uploaded By: Elpidio Eric on 04/24/2016 16:34:08 Wound Measurements Length: (cm) 4 Width: (cm) 3 Depth: (cm) 0.2 Area: (cm) 9.425 Volume: (cm) 1.885 % Reduction in Area: -50% % Reduction in Volume: -200.2% Epithelialization: None Tunneling: No Undermining: No Wound Description Classification: Partial Thickness Diabetic Severity (Wagner): Grade 1 Wound Margin: Distinct, outline attache Exudate Amount: Medium Exudate Type: Serosanguineous Exudate Color: red, brown Pacer, Selda H. ([id number]) Foul Odor After Cleansing: No d Wound Bed Granulation Amount: Medium (34-66%) Exposed Structure Necrotic Amount: Medium (34-66%) Fascia Exposed: No Necrotic Quality: Adherent Slough Fat Layer Exposed: No Tendon Exposed: No Muscle Exposed: No Joint Exposed: No Bone Exposed: No Limited to Skin Breakdown Periwound Skin Texture Texture Color No Abnormalities Noted: No No Abnormalities Noted: No Callus:  No Atrophie Blanche: No Crepitus: No Cyanosis: No Excoriation: No Ecchymosis: No Fluctuance: No Erythema: No Friable: No Hemosiderin Staining: No Induration: No Mottled: Yes Localized Edema: Yes Pallor: No Rash: No Rubor: No Scarring: No Temperature / Pain Moisture Temperature: No Abnormality No Abnormalities Noted: No Tenderness on Palpation: Yes Dry / Scaly: No Maceration: No Moist:  Yes Wound Preparation Ulcer Cleansing: Rinsed/Irrigated with Saline Topical Anesthetic Applied: Other: lidocaine 4%, Treatment Notes Wound #1 (Left, Lateral Lower Leg) 1. Cleansed with: Cleanse wound with antibacterial soap and water 4. Dressing Applied: Iodoflex 5. Secondary Dressing Applied Kerlix/Conform 7. Secured with Tape Other (specify in notes) Notes Kerlix and Waverly Ferraricoban Oboyle, Rickell H. ([id number]) Electronic Signature(s) Signed: 04/24/2016 4:40:39 PM By: Elpidio EricAfful, Rita BSN, RN Entered By: Elpidio EricAfful, Rita on 04/24/2016 10:07:31 Bittick, Leenah HMarland Kitchen. ([id number]) -------------------------------------------------------------------------------- Vitals Details Patient Name: Roza, Shiara H. Date of Service: 04/24/2016 10:00 AM Medical Record Patient Account Number: [id number] Number: Treating RN: Afful, RN, BSN, [place] [birth year] [phone](70 y.o. Other Clinician: Date of Birth/Sex: Female) Treating ROBSON, MICHAEL Primary Care Physician: Fidel LevyHAWKINS JR, JAMES Physician/Extender: G Referring Physician: Jones BroomHAWKINS JR, JAMES Weeks in Treatment: 2 Vital Signs Time Taken: 10:05 Temperature (F): 97.9 Height (in): 62 Pulse (bpm): 84 Weight (lbs): 157 Respiratory Rate (breaths/min): 18 Body Mass Index (BMI): 28.7 Blood Pressure (mmHg): 145/79 Reference Range: 80 - 120 mg / dl Electronic Signature(s) Signed: 04/24/2016 4:40:39 PM By: Elpidio EricAfful, Rita BSN, RN Entered By: Elpidio EricAfful, Rita on 04/24/2016 10:05:06

## 2016-04-26 DIAGNOSIS — J439 Emphysema, unspecified: Secondary | ICD-10-CM | POA: Diagnosis not present

## 2016-04-26 DIAGNOSIS — R0902 Hypoxemia: Secondary | ICD-10-CM | POA: Diagnosis not present

## 2016-04-26 DIAGNOSIS — R0609 Other forms of dyspnea: Secondary | ICD-10-CM | POA: Diagnosis not present

## 2016-04-27 ENCOUNTER — Other Ambulatory Visit: Payer: Self-pay | Admitting: *Deleted

## 2016-04-27 DIAGNOSIS — Z7901 Long term (current) use of anticoagulants: Secondary | ICD-10-CM

## 2016-04-30 ENCOUNTER — Other Ambulatory Visit: Payer: Medicare Other

## 2016-04-30 DIAGNOSIS — Z7901 Long term (current) use of anticoagulants: Secondary | ICD-10-CM | POA: Diagnosis not present

## 2016-05-01 ENCOUNTER — Encounter (HOSPITAL_BASED_OUTPATIENT_CLINIC_OR_DEPARTMENT_OTHER): Payer: Medicare Other | Admitting: General Surgery

## 2016-05-01 ENCOUNTER — Other Ambulatory Visit: Payer: Self-pay | Admitting: Family Medicine

## 2016-05-01 ENCOUNTER — Telehealth: Payer: Self-pay | Admitting: Family Medicine

## 2016-05-01 DIAGNOSIS — H9190 Unspecified hearing loss, unspecified ear: Secondary | ICD-10-CM | POA: Diagnosis not present

## 2016-05-01 DIAGNOSIS — J449 Chronic obstructive pulmonary disease, unspecified: Secondary | ICD-10-CM | POA: Diagnosis not present

## 2016-05-01 DIAGNOSIS — L97222 Non-pressure chronic ulcer of left calf with fat layer exposed: Secondary | ICD-10-CM | POA: Insufficient documentation

## 2016-05-01 DIAGNOSIS — I10 Essential (primary) hypertension: Secondary | ICD-10-CM | POA: Diagnosis not present

## 2016-05-01 DIAGNOSIS — I739 Peripheral vascular disease, unspecified: Secondary | ICD-10-CM | POA: Diagnosis not present

## 2016-05-01 DIAGNOSIS — E11622 Type 2 diabetes mellitus with other skin ulcer: Secondary | ICD-10-CM | POA: Diagnosis not present

## 2016-05-01 DIAGNOSIS — J45909 Unspecified asthma, uncomplicated: Secondary | ICD-10-CM | POA: Diagnosis not present

## 2016-05-01 DIAGNOSIS — Z87891 Personal history of nicotine dependence: Secondary | ICD-10-CM | POA: Diagnosis not present

## 2016-05-01 DIAGNOSIS — E1151 Type 2 diabetes mellitus with diabetic peripheral angiopathy without gangrene: Secondary | ICD-10-CM | POA: Diagnosis not present

## 2016-05-01 DIAGNOSIS — S81832D Puncture wound without foreign body, left lower leg, subsequent encounter: Secondary | ICD-10-CM | POA: Diagnosis not present

## 2016-05-01 DIAGNOSIS — Z9981 Dependence on supplemental oxygen: Secondary | ICD-10-CM | POA: Diagnosis not present

## 2016-05-01 LAB — PROTIME-INR
INR: 2.6 — AB
PROTHROMBIN TIME: 27 s — AB (ref 9.0–11.5)

## 2016-05-01 MED ORDER — GLUCOSE BLOOD VI STRP: ORAL_STRIP | Status: AC

## 2016-05-01 NOTE — Telephone Encounter (Signed)
Done. 

## 2016-05-01 NOTE — Telephone Encounter (Signed)
Pt needs a refill on accu check test strips sent to [facility]

## 2016-05-01 NOTE — Progress Notes (Addendum)
[name] ([id number]) Visit Report for 05/01/2016 Arrival Information Details Patient Name: [name], [name] Date of Service: 05/01/2016 10:45 AM Medical Record Number: [id number] Patient Account Number: [id number] Date of Birth/Sex: [birth year] (70 y.o. Female) Treating RN: Afful, RN, BSN, [place] Sink Primary Care Physician: Fidel Levy Other Clinician: Referring Physician: Fidel Levy Treating Physician/Extender: Elayne Snare in Treatment: 3 Visit Information History Since Last Visit Added or deleted any medications: No Patient Arrived: Wheel Chair Any new allergies or adverse reactions: No Arrival Time: 10:48 Had a fall or experienced change in No Accompanied By: friend activities of daily living that may affect Transfer Assistance: None risk of falls: Patient Identification Verified: Yes Signs or symptoms of abuse/neglect since last No Secondary Verification Process Yes visito Completed: Hospitalized since last visit: No Patient Requires Transmission- No Has Dressing in Place as Prescribed: Yes Based Precautions: Has Compression in Place as Prescribed: Yes Patient Has Alerts: Yes Pain Present Now: No Patient Alerts: Patient on Blood Thinner Warfarin Electronic Signature(s) Signed: 05/01/2016 11:53:43 AM By: Elpidio Eric BSN, RN Entered By: Elpidio Eric on 05/01/2016 10:49:07 [name], [name] ([id number]) -------------------------------------------------------------------------------- Encounter Discharge Information Details Patient Name: [name], [name] Date of Service: 05/01/2016 10:45 AM Medical Record Number: [id number] Patient Account Number: [id number] Date of Birth/Sex: 03-06-1946 (70 y.o. Female) Treating RN: Clover Mealy, RN, BSN, [place] Sink Primary Care Physician: Fidel Levy Other Clinician: Referring Physician: Fidel Levy Treating Physician/Extender: Elayne Snare in Treatment: 3 Encounter Discharge Information Items Discharge Pain Level:  0 Discharge Condition: Stable Ambulatory Status: Ambulatory Discharge Destination: Home Transportation: Private Auto Accompanied By: sister/friend Schedule Follow-up Appointment: No Medication Reconciliation completed and provided to Patient/Care No Stryder Poitra: Provided on Clinical Summary of Care: 05/01/2016 Form Type Recipient Paper Patient IA Electronic Signature(s) Signed: 05/01/2016 12:40:11 PM By: Ardath Sax MD Previous Signature: 05/01/2016 11:39:31 AM Version By: Elpidio Eric BSN, RN Previous Signature: 05/01/2016 11:25:58 AM Version By: Gwenlyn Perking Entered By: Ardath Sax on 05/01/2016 12:40:11 [name], [name] ([id number]) -------------------------------------------------------------------------------- Lower Extremity Assessment Details Patient Name: [name], [name] Date of Service: 05/01/2016 10:45 AM Medical Record Number: [id number] Patient Account Number: [id number] Date of Birth/Sex: Sep 11, 1946 (70 y.o. Female) Treating RN: Afful, RN, BSN, [place] Sink Primary Care Physician: Fidel Levy Other Clinician: Referring Physician: Fidel Levy Treating Physician/Extender: Elayne Snare in Treatment: 3 Edema Assessment Assessed: [Left: No] [Right: No] Edema: [Left: N] [Right: o] Calf Left: Right: Point of Measurement: 30 cm From Medial Instep 32 cm cm Ankle Left: Right: Point of Measurement: 9 cm From Medial Instep 19.7 cm cm Vascular Assessment Claudication: Claudication Assessment [Left:None] Pulses: Posterior Tibial Dorsalis Pedis Palpable: [Left:Yes] Extremity colors, hair growth, and conditions: Extremity Color: [Left:Mottled] Hair Growth on Extremity: [Left:No] Temperature of Extremity: [Left:Warm] Capillary Refill: [Left:< 3 seconds] Toe Nail Assessment Left: Right: Thick: Yes Discolored: Yes Deformed: No Improper Length and Hygiene: No Electronic Signature(s) Signed: 05/01/2016 10:59:16 AM By: Elpidio Eric BSN, RN Entered By: Elpidio Eric on  05/01/2016 10:59:15 [name], [name] ([id number]) [name], [name] ([id number]) -------------------------------------------------------------------------------- Multi Wound Chart Details Patient Name: [name], [name] Date of Service: 05/01/2016 10:45 AM Medical Record Number: [id number] Patient Account Number: [id number] Date of Birth/Sex: 1946/10/22 (70 y.o. Female) Treating RN: Clover Mealy, RN, BSN, [place] Sink Primary Care Physician: Fidel Levy Other Clinician: Referring Physician: Fidel Levy Treating Physician/Extender: Elayne Snare in Treatment: 3 Vital Signs Height(in): 62 Pulse(bpm): 82 Weight(lbs): 157 Blood Pressure 138/74 (mmHg): Body Mass Index(BMI): 29 Temperature(F): 97.5 Respiratory Rate 17 (breaths/min):  Photos: [1:No Photos] [N/A:N/A] Wound Location: [1:Left Lower Leg - Lateral] [N/A:N/A] Wounding Event: [1:Trauma] [N/A:N/A] Primary Etiology: [1:Skin Tear] [N/A:N/A] Comorbid History: [1:Cataracts, Asthma, Chronic Obstructive Pulmonary Disease (COPD), Arrhythmia, Hypertension, Peripheral Arterial Disease, Peripheral Venous Disease, Type II Diabetes] [N/A:N/A] Date Acquired: [1:02/22/2016] [N/A:N/A] Weeks of Treatment: [1:3] [N/A:N/A] Wound Status: [1:Open] [N/A:N/A] Measurements L x W x D 4.5x3.5x0.3 [N/A:N/A] (cm) Area (cm) : [1:12.37] [N/A:N/A] Volume (cm) : [1:3.711] [N/A:N/A] % Reduction in Area: [1:-96.90%] [N/A:N/A] % Reduction in Volume: -490.90% [N/A:N/A] Classification: [1:Partial Thickness] [N/A:N/A] HBO Classification: [1:Grade 1] [N/A:N/A] Exudate Amount: [1:Large] [N/A:N/A] Exudate Type: [1:Serosanguineous] [N/A:N/A] Exudate Color: [1:red, brown] [N/A:N/A] Wound Margin: [1:Distinct, outline attached] [N/A:N/A] Granulation Amount: [1:Medium (34-66%)] [N/A:N/A] Granulation Quality: [1:Pink, Pale] [N/A:N/A] Necrotic Amount: [1:Medium (34-66%)] [N/A:N/A] Exposed Structures: Fascia: No N/A N/A Fat: No Tendon: No Muscle: No Joint: No Bone:  No Limited to Skin Breakdown Epithelialization: None N/A N/A Periwound Skin Texture: Edema: Yes N/A N/A Excoriation: No Induration: No Callus: No Crepitus: No Fluctuance: No Friable: No Rash: No Scarring: No Periwound Skin Moist: Yes N/A N/A Moisture: Maceration: No Dry/Scaly: No Periwound Skin Color: Mottled: Yes N/A N/A Atrophie Blanche: No Cyanosis: No Ecchymosis: No Erythema: No Hemosiderin Staining: No Pallor: No Rubor: No Temperature: No Abnormality N/A N/A Tenderness on Yes N/A N/A Palpation: Wound Preparation: Ulcer Cleansing: N/A N/A Rinsed/Irrigated with Saline Topical Anesthetic Applied: Other: lidocaine 4% Treatment Notes Electronic Signature(s) Signed: 05/01/2016 10:59:36 AM By: Elpidio Eric BSN, RN Entered By: Elpidio Eric on 05/01/2016 10:59:36 [name], Savannah Rexene Edison ([id number]) -------------------------------------------------------------------------------- Multi-Disciplinary Care Plan Details Patient Name: [name], [name] Date of Service: 05/01/2016 10:45 AM Medical Record Number: [id number] Patient Account Number: [id number] Date of Birth/Sex: [birth year] (70 y.o. Female) Treating RN: Afful, RN, BSN, [place] Sink Primary Care Physician: Fidel Levy Other Clinician: Referring Physician: Fidel Levy Treating Physician/Extender: Elayne Snare in Treatment: 3 Active Inactive Orientation to the Wound Care Program Nursing Diagnoses: Knowledge deficit related to the wound healing center program Goals: Patient/caregiver will verbalize understanding of the Wound Healing Center Program Date Initiated: 04/10/2016 Goal Status: Active Interventions: Provide education on orientation to the wound center Notes: Venous Leg Ulcer Nursing Diagnoses: Knowledge deficit related to disease process and management Potential for venous Insuffiency (use before diagnosis confirmed) Goals: Patient will maintain optimal edema control Date Initiated: 04/10/2016 Goal  Status: Active Patient/caregiver will verbalize understanding of disease process and disease management Date Initiated: 04/10/2016 Goal Status: Active Verify adequate tissue perfusion prior to therapeutic compression application Date Initiated: 04/10/2016 Goal Status: Active Interventions: Compression as ordered Provide education on venous insufficiency Notes: MARABELLE, CUSHMAN ([id number]) Wound/Skin Impairment Nursing Diagnoses: Impaired tissue integrity Knowledge deficit related to smoking impact on wound healing Knowledge deficit related to ulceration/compromised skin integrity Goals: Patient/caregiver will verbalize understanding of skin care regimen Date Initiated: 04/10/2016 Goal Status: Active Ulcer/skin breakdown will have a volume reduction of 30% by week 4 Date Initiated: 04/10/2016 Goal Status: Active Ulcer/skin breakdown will have a volume reduction of 50% by week 8 Date Initiated: 04/10/2016 Goal Status: Active Ulcer/skin breakdown will have a volume reduction of 80% by week 12 Date Initiated: 04/10/2016 Goal Status: Active Ulcer/skin breakdown will heal within 14 weeks Date Initiated: 04/10/2016 Goal Status: Active Interventions: Assess patient/caregiver ability to obtain necessary supplies Assess ulceration(s) every visit Provide education on ulcer and skin care Treatment Activities: Skin care regimen initiated : 04/10/2016 Topical wound management initiated : 04/10/2016 Notes: Electronic Signature(s) Signed: 05/01/2016 10:59:27 AM By: Elpidio Eric BSN, RN Entered By: Elpidio Eric on  05/01/2016 10:59:26 [name], Savannah HMarland Kitchen. ([id number]) -------------------------------------------------------------------------------- Pain Assessment Details Patient Name: [name], [name] Date of Service: 05/01/2016 10:45 AM Medical Record Number: [id number] Patient Account Number: [id number] Date of Birth/Sex: 1945/12/27 40(70 y.o. Female) Treating RN: Clover MealyAfful, RN, BSN, [place] Sinkita Primary Care Physician:  Fidel LevyHAWKINS JR, JAMES Other Clinician: Referring Physician: Fidel LevyHAWKINS JR, JAMES Treating Physician/Extender: Elayne SnarePARKER, PETER Weeks in Treatment: 3 Active Problems Location of Pain Severity and Description of Pain Patient Has Paino No Site Locations With Dressing Change: No Pain Management and Medication Current Pain Management: Electronic Signature(s) Signed: 05/01/2016 11:53:43 AM By: Elpidio EricAfful, Rita BSN, RN Entered By: Elpidio EricAfful, Rita on 05/01/2016 10:51:54 [name], [name] ([id number]) -------------------------------------------------------------------------------- Patient/Caregiver Education Details Patient Name: Chrobak, Missey H. Date of Service: 05/01/2016 10:45 AM Medical Record Number: [id number] Patient Account Number: [id number] Date of Birth/Gender: 1945/12/27 75(70 y.o. Female) Treating RN: Clover MealyAfful, RN, BSN, [place] Sinkita Primary Care Physician: Fidel LevyHAWKINS JR, JAMES Other Clinician: Referring Physician: Fidel LevyHAWKINS JR, JAMES Treating Physician/Extender: Elayne SnarePARKER, PETER Weeks in Treatment: 3 Education Assessment Education Provided To: Patient Education Topics Provided Venous: Methods: Explain/Verbal Responses: State content correctly Welcome To The Wound Care Center: Methods: Explain/Verbal Responses: State content correctly Wound/Skin Impairment: Methods: Explain/Verbal Responses: State content correctly Electronic Signature(s) Signed: 05/01/2016 12:40:32 PM By: Ardath SaxParker, Peter MD Previous Signature: 05/01/2016 11:39:53 AM Version By: Elpidio EricAfful, Rita BSN, RN Entered By: Ardath SaxParker, Peter on 05/01/2016 12:40:32 [name], [name] ([id number]) -------------------------------------------------------------------------------- Wound Assessment Details Patient Name: [name], [name] Date of Service: 05/01/2016 10:45 AM Medical Record Number: [id number] Patient Account Number: [id number] Date of Birth/Sex: 1945/12/27 2(70 y.o. Female) Treating RN: Afful, RN, BSN, [place] Sinkita Primary Care Physician: Fidel LevyHAWKINS JR, JAMES Other  Clinician: Referring Physician: Fidel LevyHAWKINS JR, JAMES Treating Physician/Extender: Elayne SnarePARKER, PETER Weeks in Treatment: 3 Wound Status Wound Number: 1 Primary Skin Tear Etiology: Wound Location: Left Lower Leg - Lateral Wound Open Wounding Event: Trauma Status: Date Acquired: 02/22/2016 Comorbid Cataracts, Asthma, Chronic Obstructive Weeks Of Treatment: 3 History: Pulmonary Disease (COPD), Arrhythmia, Clustered Wound: No Hypertension, Peripheral Arterial Disease, Peripheral Venous Disease, Type II Diabetes Photos Photo Uploaded By: Elpidio EricAfful, Rita on 05/01/2016 11:58:05 Wound Measurements Length: (cm) 4.5 Width: (cm) 3.5 Depth: (cm) 0.3 Area: (cm) 12.37 Volume: (cm) 3.711 % Reduction in Area: -96.9% % Reduction in Volume: -490.9% Epithelialization: None Tunneling: No Undermining: No Wound Description Classification: Partial Thickness Diabetic Severity [address](Wagner): Grade 1 [name], [name] ([id number]) Foul Odor After Cleansing: No Wound Margin: Distinct, outline attached Exudate Amount: Large Exudate Type: Serosanguineous Exudate Color: red, brown Wound Bed Granulation Amount: Medium (34-66%) Exposed Structure Granulation Quality: Pink, Pale Fascia Exposed: No Necrotic Amount: Medium (34-66%) Fat Layer Exposed: No Necrotic Quality: Adherent Slough Tendon Exposed: No Muscle Exposed: No Joint Exposed: No Bone Exposed: No Limited to Skin Breakdown Periwound Skin Texture Texture Color No Abnormalities Noted: No No Abnormalities Noted: No Callus: No Atrophie Blanche: No Crepitus: No Cyanosis: No Excoriation: No Ecchymosis: No Fluctuance: No Erythema: No Friable: No Hemosiderin Staining: No Induration: No Mottled: Yes Localized Edema: Yes Pallor: No Rash: No Rubor: No Scarring: No Temperature / Pain Moisture Temperature: No Abnormality No Abnormalities Noted: No Tenderness on Palpation: Yes Dry / Scaly: No Maceration: No Moist: Yes Wound Preparation Ulcer  Cleansing: Rinsed/Irrigated with Saline Topical Anesthetic Applied: Other: lidocaine 4%, Treatment Notes Wound #1 (Left, Lateral Lower Leg) 1. Cleansed with: Cleanse wound with antibacterial soap and water 3. Peri-wound Care: Moisturizing lotion 4. Dressing Applied: Santyl Ointment 5. Secondary Dressing Applied [name], [name] ([id number]) Gauze and Kerlix/Conform 7. Secured with Secretary/administratorTape Electronic Signature(s) Signed:  05/01/2016 11:53:43 AM By: Elpidio Eric BSN, RN Entered By: Elpidio Eric on 05/01/2016 10:58:10 Mikes, Gittel HMarland Kitchen ([id number]) -------------------------------------------------------------------------------- Vitals Details Patient Name: Escudero, Shakaria H. Date of Service: 05/01/2016 10:45 AM Medical Record Number: [id number] Patient Account Number: [id number] Date of Birth/Sex: [birth year] (70 y.o. Female) Treating RN: Afful, RN, BSN, [place] Sink Primary Care Physician: Fidel Levy Other Clinician: Referring Physician: Fidel Levy Treating Physician/Extender: Elayne Snare in Treatment: 3 Vital Signs Time Taken: 10:53 Temperature (F): 97.5 Height (in): 62 Pulse (bpm): 82 Weight (lbs): 157 Respiratory Rate (breaths/min): 17 Body Mass Index (BMI): 28.7 Blood Pressure (mmHg): 138/74 Reference Range: 80 - 120 mg / dl Electronic Signature(s) Signed: 05/01/2016 11:53:43 AM By: Elpidio Eric BSN, RN Entered By: Elpidio Eric on 05/01/2016 10:53:37

## 2016-05-01 NOTE — Progress Notes (Signed)
Traumatic wound.  Debrided and starting Santyl

## 2016-05-02 NOTE — Progress Notes (Signed)
SALIMA, RUMER ([id number]) Visit Report for 05/01/2016 Chief Complaint Document Details Patient Name: [name], [name] 05/01/2016 10:45 Date of Service: AM Medical Record [id number] Number: Patient Account Number: [id number] 1946-05-18 (70 y.o. Treating RN: Afful, RN, BSN, [place] Sink Date of Birth/Sex: Female) Other Clinician: Primary Care Physician: Barnabas Lister Referring Physician: Fidel Levy Physician/Extender: Tania Ade in Treatment: 3 Information Obtained from: Patient Chief Complaint Patient is here for a nonhealing wound on her left lateral leg which is been present for a month Electronic Signature(s) Signed: 05/01/2016 12:36:31 PM By: Ardath Sax MD Entered By: Ardath Sax on 05/01/2016 12:36:31 [name], [name] ([id number]) -------------------------------------------------------------------------------- Debridement Details Patient Name: [name], [name] 05/01/2016 10:45 Date of Service: AM Medical Record [id number] Number: Patient Account Number: [id number] 09-Dec-1945 (70 y.o. Treating RN: Afful, RN, BSN, [place] Sink Date of Birth/Sex: Female) Other Clinician: Primary Care Physician: Jonna Clark, [place] Newcomer, Rula [place] Referring Physician: Fidel Levy Physician/Extender: Tania Ade in Treatment: 3 Debridement Performed for Wound #1 Left,Lateral Lower Leg Assessment: Performed By: Physician Ardath Sax, MD Debridement: Debridement Pre-procedure Yes Verification/Time Out Taken: Start Time: 11:12 Pain Control: Lidocaine 4% Topical Solution Level: Skin/Subcutaneous Tissue Total Area Debrided (L x 4.5 (cm) x 3.5 (cm) = 15.75 (cm) W): Tissue and other Non-Viable, Fibrin/Slough, Subcutaneous material debrided: Instrument: Curette, Forceps Bleeding: Minimum Hemostasis Achieved: Pressure End Time: 11:16 Procedural Pain: 3 Post Procedural Pain: 3 Response to Treatment: Procedure was tolerated well Post Debridement Measurements of Total  Wound Length: (cm) 4.5 Width: (cm) 3.5 Depth: (cm) 0.3 Volume: (cm) 3.711 Post Procedure Diagnosis Same as Pre-procedure Electronic Signature(s) Signed: 05/01/2016 11:53:43 AM By: Elpidio Eric BSN, RN Signed: 05/01/2016 3:04:31 PM By: Ardath Sax MD Entered By: Elpidio Eric on 05/01/2016 11:15:41 [name], [name] ([id number]) -------------------------------------------------------------------------------- HPI Details Patient Name: [name], [name] 05/01/2016 10:45 Date of Service: AM Medical Record [id number] Number: Patient Account Number: [id number] May 11, 1946 (70 y.o. Treating RN: Afful, RN, BSN, [place] Sink Date of Birth/Sex: Female) Other Clinician: Primary Care Physician: Barnabas Lister Referring Physician: Fidel Levy Physician/Extender: Tania Ade in Treatment: 3 History of Present Illness HPI Description: 04/10/16; this is a 70 year old woman who lives in [place] next to a sister who helps care for her. She traumatized her left leg roughly a month ago on the bed frame of a hospital bed she has at home. I description predominantly of her sister it sounds as though she had a laceration with a skin flap. The skin did not hold together and it came off the surface of the wound. She was seen by her primary care physician last week who noted a black eschar on the top of the wound. Prescribed a topical cream as well as cephalexin for erythema around the wound. It sounds as though the black eschar came off the wound leaving an open wound on the left leg. She has been using Neosporin as a primary wound dressing for most of the last month. She does not have a prior wound history. The patient is followed by Dr. Wyn Quaker vascular surgery and it sounds as though at some point she had a procedure on her left leg possibly a stent. ABI in this clinic was 0.9 on the left with monophasic waveforms. She is a diabetic with last hemoglobin A1c at 6.8 on 03/19/16. She does not have a  known history of neuropathy 04/17/16; no major change in the condition of this wound on her left anterior lateral leg. There is mild erythema but no  tenderness and I don't think there is any evidence of infection area we still don't have Dr. Kristeen Mans vascular surgery notes 04/24/16; no major change in the condition of this wound on the left anterior leg. Initially trauma. Not felt to have a significant arterial component by vascular surgery. Electronic Signature(s) Signed: 05/01/2016 12:36:59 PM By: Ardath Sax MD Entered By: Ardath Sax on 05/01/2016 12:36:59 Garn, [name] ([id number]) -------------------------------------------------------------------------------- Physical Exam Details Patient Name: [name], [name] 05/01/2016 10:45 Date of Service: AM Medical Record [id number] Number: Patient Account Number: [id number] 1946-09-08 (70 y.o. Treating RN: Clover Mealy, RN, BSN, [place] Sink Date of Birth/Sex: Female) Other Clinician: Primary Care Physician: Jonna Clark, [place] Newcomer, Amelya Mabry Referring Physician: Fidel Levy Physician/Extender: Tania Ade in Treatment: 3 Electronic Signature(s) Signed: 05/01/2016 12:37:08 PM By: Ardath Sax MD Entered By: Ardath Sax on 05/01/2016 12:37:08 [name], [name] ([id number]) -------------------------------------------------------------------------------- Physician Orders Details Patient Name: [name], [name] 05/01/2016 10:45 Date of Service: AM Medical Record [id number] Number: Patient Account Number: [id number] 04-09-1946 (70 y.o. Treating RN: Clover Mealy, RN, BSN, [place] Sink Date of Birth/Sex: Female) Other Clinician: Primary Care Physician: Jonna Clark, [place] Newcomer, Saxon Barich Referring Physician: Fidel Levy Physician/Extender: Tania Ade in Treatment: 3 Verbal / Phone Orders: Yes Clinician: Afful, RN, BSN, Rita Read Back and Verified: Yes Diagnosis Coding Wound Cleansing Wound #1 Left,Lateral Lower Leg o May shower with protection. o No tub  bath. o Other: - DO not remove dressing Anesthetic Wound #1 Left,Lateral Lower Leg o Topical Lidocaine 4% cream applied to wound bed prior to debridement Primary Wound Dressing Wound #1 Left,Lateral Lower Leg o Santyl Ointment Secondary Dressing Wound #1 Left,Lateral Lower Leg o Conform/Kerlix Dressing Change Frequency Wound #1 Left,Lateral Lower Leg o Change dressing every day. Follow-up Appointments Wound #1 Left,Lateral Lower Leg o Return Appointment in 1 week. Edema Control Wound #1 Left,Lateral Lower Leg o Elevate legs to the level of the heart and pump ankles as often as possible Additional Orders / Instructions Wound #1 Left,Lateral Lower Leg o Increase protein intake. Shuster, [name] ([id number]) o OK to return to work with the following restrictions: o Activity as tolerated Oxygen Administration Wound #1 Left,Lateral Lower Leg o While patient is in clinic, provide supplimental oxygen via nasal cannula at liters/min __ : - 2liters Medications-please add to medication list. Wound #1 Left,Lateral Lower Leg o Santyl Enzymatic Ointment - Script given to patient Electronic Signature(s) Signed: 05/01/2016 11:53:43 AM By: Elpidio Eric BSN, RN Signed: 05/01/2016 3:04:31 PM By: Ardath Sax MD Entered By: Elpidio Eric on 05/01/2016 11:27:29 [name], [name] ([id number]) -------------------------------------------------------------------------------- Prescription 05/01/2016 Patient Name: Hufstetler, Avalie H. Physician: Ardath Sax MD Date of Birth: October 17, 1946 NPI#: [id number] Sex: F DEA#: Phone #: [phone] License #: Patient Address: [facility] [address] [place], [place] [address] [facility] [address] [place], [place] [address] [phone] Allergies PCN Physician's Orders Santyl Ointment Signature(s): Date(s): [name], [name] ([id number]) Prescription  05/01/2016 Patient Name: Brathwaite, ELSEY HOLTS. Physician: Ardath Sax MD Date of Birth: [birth year] NPI#: [id number] Sex: F DEA#: Phone #: [phone] License #: Patient Address: [facility] [address] [place], [place] [address] [facility] [address] [place], [place] [address] [phone] Allergies PCN Physician's Orders Santyl Enzymatic Ointment - Script given to patient Signature(s): Date(s): Electronic Signature(s) Signed: 05/01/2016 12:39:38 PM By: Ardath Sax MD Previous Signature: 05/01/2016 11:53:43 AM Version By: Elpidio Eric BSN, RN Entered By: Jimmey Ralph,  Tysin Salada on 05/01/2016 12:39:37 [name], Savannah ReeksIDA H. ([id number]) --------------------------------------------------------------------------------  Problem List Details Patient Name: [name], Deirdre H. 05/01/2016 10:45 Date of Service: AM Medical Record [id number] Number: Patient Account Number: [id number] 06-Jun-1946 (70 y.o. 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 ([id number]) -------------------------------------------------------------------------------- Progress Note Details Patient Name: Savannah FaithADAMS, Raneen H. 05/01/2016 10:45 Date of Service: AM Medical  Record [id number] Number: Patient Account Number: [id number] 06-Jun-1946 (70 y.o. Treating RN: Clover MealyAfful, RN, BSN, [place] Sinkita Date of Birth/Sex: Female) Other Clinician: Primary Care Physician: Barnabas ListerHAWKINS JR, JAMES Treating Aila Terra Referring Physician: Fidel LevyHAWKINS JR, JAMES Physician/Extender: Tania AdeWeeks in Treatment: 3 Subjective Chief Complaint Information obtained from Patient Patient is here for a nonhealing wound on her left lateral leg which is been present for a month History of Present Illness (HPI) 04/10/16; this is a 70 year old woman who lives in [place] next to a sister who helps care for her. She traumatized her left leg roughly a month ago on the bed frame of a hospital bed she has at home. I description predominantly of her sister it sounds as though she had a laceration with a skin flap. The skin did not hold together and it came off the surface of the wound. She was seen by her primary care physician last week who noted a black eschar on the top of the wound. Prescribed a topical cream as well as cephalexin for erythema around the wound. It sounds as though the black eschar came off the wound leaving an open wound on the left leg. She has been using Neosporin as a primary wound dressing for most of the last month. She does not have a prior wound history. The patient is followed by Dr. Wyn Quakerew vascular surgery and it sounds as though at some point she had a procedure on her left leg possibly a stent. ABI in this clinic was 0.9 on the left with monophasic waveforms. She is a diabetic with last hemoglobin A1c at 6.8 on 03/19/16. She does not have a known history of neuropathy 04/17/16; no major change in the condition of this wound on her left anterior lateral leg. There is mild erythema but no tenderness and I don't think there is any evidence of infection area we still don't have Dr. Kristeen Mansdews vascular surgery notes 04/24/16; no major change in the condition of this wound on the left anterior leg.  Initially trauma. Not felt to have a significant arterial component by vascular surgery. Objective Constitutional Vitals Time Taken: 10:53 AM, Height: 62 in, Weight: 157 lbs, BMI: 28.7, Temperature: 97.5 F, Pulse: 82 bpm, Respiratory Rate: 17 breaths/min, Blood Pressure: 138/74 mmHg. Romano, [place] H. ([id number]) Integumentary (Hair, Skin) Wound #1 status is Open. Original cause of wound was Trauma. The wound is located on the Left,Lateral Lower Leg. The wound measures 4.5cm length x 3.5cm width x 0.3cm depth; 12.37cm^2 area and 3.711cm^3 volume. The wound is limited to skin breakdown. There is no tunneling or undermining noted. There is a large  amount of serosanguineous drainage noted. The wound margin is distinct with the outline attached to the wound base. There is medium (34-66%) pink, pale granulation within the wound bed. There is a medium (34-66%) amount of necrotic tissue within the wound bed including Adherent Slough. The periwound skin appearance exhibited: Localized Edema, Moist, Mottled. The periwound skin appearance did not exhibit: Callus, Crepitus, Excoriation, Fluctuance, Friable, Induration, Rash, Scarring, Dry/Scaly, Maceration, Atrophie Blanche, Cyanosis, Ecchymosis, Hemosiderin Staining, Pallor, Rubor, Erythema. Periwound temperature was noted as No Abnormality. The periwound has tenderness on palpation. 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 There was a Skin/Subcutaneous Tissue Debridement ([id number]) debridement with total area of 15.75 sq cm performed by Ardath Sax, MD. with the following instrument(s): Curette and Forceps to remove Non-Viable tissue/material  including Fibrin/Slough and Subcutaneous after achieving pain control using Lidocaine 4% Topical Solution. A time out was conducted prior to the start of the procedure. A Minimum amount of bleeding was controlled with Pressure. The procedure was tolerated well with a pain level of 3 throughout and a pain level of 3 following the procedure. Post Debridement Measurements: 4.5cm length x 3.5cm width x 0.3cm depth; 3.711cm^3 volume. Post procedure Diagnosis Wound #1: Same as Pre-Procedure Debrided necrotic tissue from wound. Starting [place] daily Schoonmaker, Meesha H. ([id number]) Plan Wound Cleansing: Wound #1 Left,Lateral Lower Leg: May shower with protection. No tub bath. Other: - DO not remove dressing Anesthetic: Wound #1 Left,Lateral Lower Leg: Topical Lidocaine 4% cream applied to wound bed prior to debridement Primary Wound Dressing: Wound #1 Left,Lateral Lower Leg: Santyl Ointment Secondary Dressing: Wound #1 Left,Lateral Lower Leg: Conform/Kerlix Dressing Change Frequency: Wound #1 Left,Lateral Lower Leg: Change dressing every day. Follow-up Appointments: Wound #1 Left,Lateral Lower Leg: Return Appointment in 1 week. Edema Control: Wound #1 Left,Lateral Lower Leg: Elevate legs to the level of the heart and pump ankles as often as possible Additional Orders / Instructions: Wound #1 Left,Lateral Lower Leg: Increase protein intake. OK to return to work with the following restrictions: Activity as tolerated Oxygen Administration: Wound #1 Left,Lateral Lower Leg: While patient is in clinic, provide supplimental oxygen via nasal cannula at liters/min __ : - 2liters Medications-please add to medication list.: Wound #1 Left,Lateral Lower Leg: Santyl Enzymatic Ointment - Script given to patient Follow-Up Appointments: A Patient Clinical Summary of Care was provided to [place] Electronic Signature(s) BELMA, DYCHES ([id number]) Signed: 05/01/2016 12:38:51 PM By: Ardath Sax MD Entered  By: Ardath Sax on 05/01/2016 12:38:51 Echeverry, Baylen H. ([id number]) -------------------------------------------------------------------------------- SuperBill Details Patient Name: Vanhorn, Kearstyn H. Date of Service: 05/01/2016 Medical Record Patient Account Number: [id number] Number: Afful, RN, BSN, Treating RN: 10/18/1946 [phone] y.o. 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 S81.832D Puncture wound without foreign body, left lower leg, subsequent encounter Facility Procedures CPT4 Code Description: [id number] 11042 - DEB SUBQ TISSUE 20 SQ CM/< ICD-10 Description Diagnosis S81.832D Puncture wound without foreign body, left lower le Modifier: g, subsequent en Quantity: 1 counter Physician Procedures CPT4  Code Description: 1610960 99213 - WC PHYS LEVEL 3 - EST PT ICD-10 Description Diagnosis E11.622 Type 2 diabetes mellitus with other skin ulcer Modifier: Quantity: 1 CPT4 Code Description: 4540981 11042 - WC PHYS SUBQ TISS 20 SQ CM ICD-10 Description Diagnosis S81.832D Puncture wound without foreign body, left lower le Modifier: g, subsequent en Quantity: 1 counter Electronic Signature(s) Signed: 05/01/2016 12:39:29 PM By: Ardath Sax MD Entered By: Ardath Sax on 05/01/2016 12:39:29

## 2016-05-07 ENCOUNTER — Other Ambulatory Visit: Payer: Self-pay | Admitting: Family Medicine

## 2016-05-07 DIAGNOSIS — D689 Coagulation defect, unspecified: Secondary | ICD-10-CM

## 2016-05-07 MED ORDER — WARFARIN SODIUM 2 MG PO TABS: ORAL_TABLET | ORAL | Status: DC

## 2016-05-08 ENCOUNTER — Encounter: Payer: Medicare Other | Admitting: Internal Medicine

## 2016-05-08 DIAGNOSIS — E1151 Type 2 diabetes mellitus with diabetic peripheral angiopathy without gangrene: Secondary | ICD-10-CM | POA: Diagnosis not present

## 2016-05-08 DIAGNOSIS — S81812A Laceration without foreign body, left lower leg, initial encounter: Secondary | ICD-10-CM | POA: Diagnosis not present

## 2016-05-08 DIAGNOSIS — E11622 Type 2 diabetes mellitus with other skin ulcer: Secondary | ICD-10-CM | POA: Diagnosis not present

## 2016-05-08 DIAGNOSIS — Z9981 Dependence on supplemental oxygen: Secondary | ICD-10-CM | POA: Diagnosis not present

## 2016-05-08 DIAGNOSIS — J45909 Unspecified asthma, uncomplicated: Secondary | ICD-10-CM | POA: Diagnosis not present

## 2016-05-08 DIAGNOSIS — I739 Peripheral vascular disease, unspecified: Secondary | ICD-10-CM | POA: Diagnosis not present

## 2016-05-08 DIAGNOSIS — Z87891 Personal history of nicotine dependence: Secondary | ICD-10-CM | POA: Diagnosis not present

## 2016-05-08 DIAGNOSIS — H9190 Unspecified hearing loss, unspecified ear: Secondary | ICD-10-CM | POA: Diagnosis not present

## 2016-05-08 DIAGNOSIS — I10 Essential (primary) hypertension: Secondary | ICD-10-CM | POA: Diagnosis not present

## 2016-05-08 DIAGNOSIS — J449 Chronic obstructive pulmonary disease, unspecified: Secondary | ICD-10-CM | POA: Diagnosis not present

## 2016-05-08 DIAGNOSIS — S81832D Puncture wound without foreign body, left lower leg, subsequent encounter: Secondary | ICD-10-CM | POA: Diagnosis not present

## 2016-05-09 DIAGNOSIS — J449 Chronic obstructive pulmonary disease, unspecified: Secondary | ICD-10-CM | POA: Diagnosis not present

## 2016-05-09 NOTE — Progress Notes (Addendum)
[name], [name] ([id number]) Visit Report for 05/08/2016 Arrival Information Details Patient Name: [name] [name] ReeksDA H. Date of Service: 05/08/2016 12:45 PM Medical Record Patient Account Number: [id number] Number: Treating RN: Clover MealyAfful, RN, BSN, Rita 02-Dec-1945 [phone](70 y.o. Other Clinician: Date of Birth/Sex: Female) Treating ROBSON, MICHAEL Primary Care Physician: Fidel LevyHAWKINS JR, JAMES Physician/Extender: G Referring Physician: Jones BroomHAWKINS JR, JAMES Weeks in Treatment: 4 Visit Information History Since Last Visit Added or deleted any medications: No Patient Arrived: Wheel Chair Any new allergies or adverse reactions: No Arrival Time: 12:55 Had a fall or experienced change in No Accompanied By: sis activities of daily living that may affect Transfer Assistance: None risk of falls: Patient Identification Verified: Yes Signs or symptoms of abuse/neglect since last No Secondary Verification Process Yes visito Completed: Hospitalized since last visit: No Patient Requires Transmission- No Has Dressing in Place as Prescribed: Yes Based Precautions: Pain Present Now: No Patient Has Alerts: Yes Patient Alerts: Patient on Blood Thinner Warfarin Electronic Signature(s) Signed: 05/08/2016 3:01:26 PM By: Elpidio EricAfful, Rita BSN, RN Entered By: Elpidio EricAfful, Rita on 05/08/2016 12:55:26 [name] [name] H. ([id number]) -------------------------------------------------------------------------------- Encounter Discharge Information Details Patient Name: [name] [name] H. Date of Service: 05/08/2016 12:45 PM Medical Record Patient Account Number: [id number] Number: Treating RN: Clover MealyAfful, RN, BSN, Rita 02-Dec-1945 [phone](70 y.o. Other Clinician: Date of Birth/Sex: Female) Treating ROBSON, MICHAEL Primary Care Physician: Fidel LevyHAWKINS JR, JAMES Physician/Extender: G Referring Physician: Jones BroomHAWKINS JR, JAMES Weeks in Treatment: 4 Encounter Discharge Information Items Discharge Pain Level: 0 Discharge Condition: Stable Ambulatory  Status: Wheelchair Discharge Destination: Home Private Transportation: Auto Accompanied By: sister Schedule Follow-up Appointment: No Medication Reconciliation completed and Yes provided to Patient/Care [name]: Clinical Summary of Care: Electronic Signature(s) Signed: 05/08/2016 3:01:26 PM By: Elpidio EricAfful, Rita BSN, RN Previous Signature: 05/08/2016 1:27:04 PM Version By: Gwenlyn PerkingMoore, Shelia Entered By: Elpidio EricAfful, Rita on 05/08/2016 13:27:09 [name] H. ([id number]) -------------------------------------------------------------------------------- Lower Extremity Assessment Details Patient Name: [name] H. Date of Service: 05/08/2016 12:45 PM Medical Record Patient Account Number: [id number] Number: Treating RN: Clover MealyAfful, RN, BSN, Rita 02-Dec-1945 [phone](70 y.o. Other Clinician: Date of Birth/Sex: Female) Treating ROBSON, MICHAEL Primary Care Physician: Fidel LevyHAWKINS JR, JAMES Physician/Extender: G Referring Physician: Fidel LevyHAWKINS JR, JAMES Weeks in Treatment: 4 Edema Assessment Assessed: [Left: No] [Right: No] E[Left: dema] [Right: :] Calf Left: Right: Point of Measurement: 30 cm From Medial Instep cm cm Ankle Left: Right: Point of Measurement: 9 cm From Medial Instep cm cm Vascular Assessment Pulses: Posterior Tibial Dorsalis Pedis Palpable: [Left:Yes] Extremity colors, hair growth, and conditions: Extremity Color: [Left:Mottled] Hair Growth on Extremity: [Left:No] Temperature of Extremity: [Left:Warm] Capillary Refill: [Left:< 3 seconds] Toe Nail Assessment Left: Right: Thick: Yes Discolored: No Deformed: No Improper Length and Hygiene: No Electronic Signature(s) Signed: 05/08/2016 3:01:26 PM By: Elpidio EricAfful, Rita BSN, RN Entered By: Elpidio EricAfful, Rita on 05/08/2016 12:58:12 [name] H. ([id number]) [name] H. ([id number]) -------------------------------------------------------------------------------- Multi Wound Chart Details Patient Name: [name] H. Date of Service: 05/08/2016 12:45  PM Medical Record Patient Account Number: [id number] Number: Treating RN: Clover MealyAfful, RN, BSN, Rita 02-Dec-1945 [phone](70 y.o. Other Clinician: Date of Birth/Sex: Female) Treating ROBSON, MICHAEL Primary Care Physician: Fidel LevyHAWKINS JR, JAMES Physician/Extender: G Referring Physician: Fidel LevyHAWKINS JR, JAMES Weeks in Treatment: 4 Vital Signs Height(in): 62 Pulse(bpm): 88 Weight(lbs): 157 Blood Pressure 125/73 (mmHg): Body Mass Index(BMI): 29 Temperature(F): 97.6 Respiratory Rate 18 (breaths/min): Photos: [1:No Photos] [N/A:N/A] Wound Location: [1:Left Lower Leg - Lateral] [N/A:N/A] Wounding Event: [1:Trauma] [N/A:N/A] Primary Etiology: [1:Skin Tear] [N/A:N/A] Comorbid History: [1:Cataracts, Asthma, Chronic Obstructive Pulmonary Disease (  COPD), Arrhythmia, Hypertension, Peripheral Arterial Disease, Peripheral Venous Disease, Type II Diabetes] [N/A:N/A] Date Acquired: [1:02/22/2016] [N/A:N/A] Weeks of Treatment: [1:4] [N/A:N/A] Wound Status: [1:Open] [N/A:N/A] Measurements L x W x D 4.8x3.3x0.3 [N/A:N/A] (cm) Area (cm) : [1:12.441] [N/A:N/A] Volume (cm) : [1:3.732] [N/A:N/A] % Reduction in Area: [1:-98.00%] [N/A:N/A] % Reduction in Volume: -494.30% [N/A:N/A] Classification: [1:Partial Thickness] [N/A:N/A] HBO Classification: [1:Grade 1] [N/A:N/A] Exudate Amount: [1:Large] [N/A:N/A] Exudate Type: [1:Serosanguineous] [N/A:N/A] Exudate Color: [1:red, brown] [N/A:N/A] Wound Margin: [1:Distinct, outline attached] [N/A:N/A] Granulation Amount: [1:Small (1-33%)] [N/A:N/A] Granulation Quality: Pink, Pale N/A N/A Necrotic Amount: Large (67-100%) N/A N/A Exposed Structures: Fascia: No N/A N/A Fat: No Tendon: No Muscle: No Joint: No Bone: No Limited to Skin Breakdown Epithelialization: None N/A N/A Periwound Skin Texture: Edema: Yes N/A N/A Excoriation: No Induration: No Callus: No Crepitus: No Fluctuance: No Friable: No Rash: No Scarring: No Periwound Skin Moist: Yes N/A  N/A Moisture: Maceration: No Dry/Scaly: No Periwound Skin Color: Mottled: Yes N/A N/A Atrophie Blanche: No Cyanosis: No Ecchymosis: No Erythema: No Hemosiderin Staining: No Pallor: No Rubor: No Temperature: No Abnormality N/A N/A Tenderness on Yes N/A N/A Palpation: Wound Preparation: Ulcer Cleansing: N/A N/A Rinsed/Irrigated with Saline Topical Anesthetic Applied: Other: lidocaine 4% Treatment Notes Electronic Signature(s) Signed: 05/08/2016 3:01:26 PM By: Elpidio EricAfful, Rita BSN, RN Entered By: Elpidio EricAfful, Rita on 05/08/2016 13:14:52 [name] Rexene EdisonH. ([id number]) -------------------------------------------------------------------------------- Multi-Disciplinary Care Plan Details Patient Name: [name] H. Date of Service: 05/08/2016 12:45 PM Medical Record Patient Account Number: [id number] Number: Treating RN: Clover MealyAfful, RN, BSN, Rita 07-07-1946 [phone](70 y.o. Other Clinician: Date of Birth/Sex: Female) Treating ROBSON, MICHAEL Primary Care Physician: Fidel LevyHAWKINS JR, JAMES Physician/Extender: G Referring Physician: Jones BroomHAWKINS JR, JAMES Weeks in Treatment: 4 Active Inactive Electronic Signature(s) Signed: 05/28/2016 4:43:54 PM By: Elliot GurneyWoody, RN, BSN, Kim RN, BSN Signed: 07/10/2016 3:27:27 PM By: Elpidio EricAfful, Rita BSN, RN Previous Signature: 05/08/2016 3:01:26 PM Version By: Elpidio EricAfful, Rita BSN, RN Entered By: Elliot GurneyWoody, RN, BSN, Kim on 05/21/2016 09:16:30 [name] [name] HMarland Kitchen. ([id number]) -------------------------------------------------------------------------------- Pain Assessment Details Patient Name: Demary, Dalaya H. Date of Service: 05/08/2016 12:45 PM Medical Record Patient Account Number: [id number] Number: Treating RN: Clover MealyAfful, RN, BSN, Rita 07-07-1946 [phone](70 y.o. Other Clinician: Date of Birth/Sex: Female) Treating ROBSON, MICHAEL Primary Care Physician: Fidel LevyHAWKINS JR, JAMES Physician/Extender: G Referring Physician: Jones BroomHAWKINS JR, JAMES Weeks in Treatment: 4 Active Problems Location of Pain Severity  and Description of Pain Patient Has Paino No Site Locations With Dressing Change: No Pain Management and Medication Current Pain Management: Electronic Signature(s) Signed: 05/08/2016 3:01:26 PM By: Elpidio EricAfful, Rita BSN, RN Entered By: Elpidio EricAfful, Rita on 05/08/2016 12:55:33 [name] Rexene EdisonH. ([id number]) -------------------------------------------------------------------------------- Patient/Caregiver Education Details Patient Name: [name] H. Date of Service: 05/08/2016 12:45 PM Medical Record Patient Account Number: [id number] Number: Treating RN: Clover MealyAfful, RN, BSN, Rita 07-07-1946 [phone](70 y.o. Other Clinician: Date of Birth/Gender: Female) Treating ROBSON, MICHAEL Primary Care Physician: Fidel LevyHAWKINS JR, JAMES Physician/Extender: G Referring Physician: Jones BroomHAWKINS JR, JAMES Weeks in Treatment: 4 Education Assessment Education Provided To: Patient Education Topics Provided Venous: Methods: Explain/Verbal Responses: State content correctly [facility]: Methods: Explain/Verbal Responses: State content correctly Wound/Skin Impairment: Methods: Explain/Verbal Responses: State content correctly Electronic Signature(s) Signed: 05/08/2016 3:01:26 PM By: Elpidio EricAfful, Rita BSN, RN Entered By: Elpidio EricAfful, Rita on 05/08/2016 13:27:24 [name] H. ([id number]) -------------------------------------------------------------------------------- Wound Assessment Details Patient Name: Deleeuw, [name] H. Date of Service: 05/08/2016 12:45 PM Medical Record Patient Account Number: [id number] Number: Treating RN: Clover MealyAfful, RN, BSN, Rita 07-07-1946 [phone](70 y.o. Other Clinician: Date  of Birth/Sex: Female) Treating ROBSON, MICHAEL Primary Care Physician: Fidel Levy Physician/Extender: G Referring Physician: Fidel Levy Weeks in Treatment: 4 Wound Status Wound Number: 1 Primary Skin Tear Etiology: Wound Location: Left Lower Leg - Lateral Wound Open Wounding Event:  Trauma Status: Date Acquired: 02/22/2016 Comorbid Cataracts, Asthma, Chronic Obstructive Weeks Of Treatment: 4 History: Pulmonary Disease (COPD), Arrhythmia, Clustered Wound: No Hypertension, Peripheral Arterial Disease, Peripheral Venous Disease, Type II Diabetes Wound Measurements Length: (cm) 4.8 Width: (cm) 3.3 Depth: (cm) 0.3 Area: (cm) 12.441 Volume: (cm) 3.732 % Reduction in Area: -98% % Reduction in Volume: -494.3% Epithelialization: None Tunneling: No Undermining: No Wound Description Classification: Partial Thickness Diabetic Severity (Wagner): Grade 1 Wound Margin: Distinct, outline attached Exudate Amount: Large Exudate Type: Serosanguineous Exudate Color: red, brown Foul Odor After Cleansing: No Wound Bed Granulation Amount: Small (1-33%) Exposed Structure Granulation Quality: Pink, Pale Fascia Exposed: No Necrotic Amount: Large (67-100%) Fat Layer Exposed: No Necrotic Quality: Adherent Slough Tendon Exposed: No Muscle Exposed: No Joint Exposed: No Bone Exposed: No Limited to Skin Breakdown Periwound Skin Texture Texture Color Getty, Andra H. ([id number]) No Abnormalities Noted: No No Abnormalities Noted: No Callus: No Atrophie Blanche: No Crepitus: No Cyanosis: No Excoriation: No Ecchymosis: No Fluctuance: No Erythema: No Friable: No Hemosiderin Staining: No Induration: No Mottled: Yes Localized Edema: Yes Pallor: No Rash: No Rubor: No Scarring: No Temperature / Pain Moisture Temperature: No Abnormality No Abnormalities Noted: No Tenderness on Palpation: Yes Dry / Scaly: No Maceration: No Moist: Yes Wound Preparation Ulcer Cleansing: Rinsed/Irrigated with Saline Topical Anesthetic Applied: Other: lidocaine 4%, Electronic Signature(s) Signed: 05/08/2016 3:01:26 PM By: Elpidio Eric BSN, RN Entered By: Elpidio Eric on 05/08/2016 13:01:17 Romaniello, Darwin H.  ([id number]) -------------------------------------------------------------------------------- Vitals Details Patient Name: Tompson, Novella H. Date of Service: 05/08/2016 12:45 PM Medical Record Patient Account Number: [id number] Number: Treating RN: Clover Mealy, RN, BSN, Rita Sep 07, 1946 [phone] y.o. Other Clinician: Date of Birth/Sex: Female) Treating ROBSON, MICHAEL Primary Care Physician: Fidel Levy Physician/Extender: G Referring Physician: Jones Broom in Treatment: 4 Vital Signs Time Taken: 12:57 Temperature (F): 97.6 Height (in): 62 Pulse (bpm): 88 Weight (lbs): 157 Respiratory Rate (breaths/min): 18 Body Mass Index (BMI): 28.7 Blood Pressure (mmHg): 125/73 Reference Range: 80 - 120 mg / dl Electronic Signature(s) Signed: 05/08/2016 3:01:26 PM By: Elpidio Eric BSN, RN Entered By: Elpidio Eric on 05/08/2016 12:57:18

## 2016-05-09 NOTE — Progress Notes (Signed)
[name], [name] ([id number]) Visit Report for 05/08/2016 Chief Complaint Document Details Patient Name: [name], [name] Date of Service: 05/08/2016 12:45 PM Medical Record Patient Account Number: [id number] Number: Treating RN: Clover MealyAfful, RN, BSN, Rita 04/01/1946 [phone](70 y.o. Other Clinician: Date of Birth/Sex: Female) Treating ROBSON, MICHAEL Primary Care Physician/Extender: Comer LocketG HAWKINS JR, JAMES Physician: Referring Physician: Jones BroomHAWKINS JR, JAMES Weeks in Treatment: 4 Information Obtained from: Patient Chief Complaint Patient is here for a nonhealing wound on her left lateral leg which is been present for a month Electronic Signature(s) Signed: 05/09/2016 12:38:30 PM By: Baltazar Najjarobson, Michael MD Entered By: Baltazar Najjarobson, Michael on 05/08/2016 13:42:04 [name], [name] ([id number]) -------------------------------------------------------------------------------- Debridement Details Patient Name: [name], [name] Date of Service: 05/08/2016 12:45 PM Medical Record Patient Account Number: [id number] Number: Treating RN: Clover MealyAfful, RN, BSN, Rita 04/01/1946 [phone](70 y.o. Other Clinician: Date of Birth/Sex: Female) Treating ROBSON, MICHAEL Primary Care Physician/Extender: Comer LocketG HAWKINS JR, JAMES Physician: Referring Physician: Jones BroomHAWKINS JR, JAMES Weeks in Treatment: 4 Debridement Performed for Wound #1 Left,Lateral Lower Leg Assessment: Performed By: Physician Maxwell CaulOBSON, MICHAEL G, MD Debridement: Debridement Pre-procedure Yes Verification/Time Out Taken: Start Time: 13:14 Pain Control: Lidocaine 4% Topical Solution Level: Skin/Subcutaneous Tissue Total Area Debrided (L x 4.8 (cm) x 3.3 (cm) = 15.84 (cm) W): Tissue and other Non-Viable, Fibrin/Slough, Subcutaneous material debrided: Instrument: Curette Bleeding: Moderate Hemostasis Achieved: Silver Nitrate End Time: 13:19 Procedural Pain: 0 Post Procedural Pain: 0 Response to Treatment: Procedure was tolerated well Post Debridement Measurements of  Total Wound Length: (cm) 4.8 Width: (cm) 3.3 Depth: (cm) 0.3 Volume: (cm) 3.732 Post Procedure Diagnosis Same as Pre-procedure Electronic Signature(s) Signed: 05/08/2016 3:01:26 PM By: Elpidio EricAfful, Rita BSN, RN Signed: 05/09/2016 12:38:30 PM By: Baltazar Najjarobson, Michael MD Entered By: Baltazar Najjarobson, Michael on 05/08/2016 13:41:42 [name], [name] ([id number]) [name], [name] ([id number]) -------------------------------------------------------------------------------- HPI Details Patient Name: [name], [name] Date of Service: 05/08/2016 12:45 PM Medical Record Patient Account Number: [id number] Number: Treating RN: Clover MealyAfful, RN, BSN, Rita 04/01/1946 [phone](70 y.o. Other Clinician: Date of Birth/Sex: Female) Treating ROBSON, MICHAEL Primary Care Physician/Extender: Comer LocketG HAWKINS JR, JAMES Physician: Referring Physician: Jones BroomHAWKINS JR, JAMES Weeks in Treatment: 4 History of Present Illness HPI Description: 04/10/16; this is a 70 year old woman who lives in [place] next to a sister who helps care for her. She traumatized her left leg roughly a month ago on the bed frame of a hospital bed she has at home. I description predominantly of her sister it sounds as though she had a laceration with a skin flap. The skin did not hold together and it came off the surface of the wound. She was seen by her primary care physician last week who noted a black eschar on the top of the wound. Prescribed a topical cream as well as cephalexin for erythema around the wound. It sounds as though the black eschar came off the wound leaving an open wound on the left leg. She has been using Neosporin as a primary wound dressing for most of the last month. She does not have a prior wound history. The patient is followed by Dr. Wyn Quakerew vascular surgery and it sounds as though at some point she had a procedure on her left leg possibly a stent. ABI in this clinic was 0.9 on the left with monophasic waveforms. She is a diabetic with last hemoglobin A1c  at 6.8 on 03/19/16. She does not have a known history of neuropathy 04/17/16; no major change in the condition of this wound on her left anterior  lateral leg. There is mild erythema but no tenderness and I don't think there is any evidence of infection area we still don't have Dr. Kristeen Mansdews vascular surgery notes 04/24/16; no major change in the condition of this wound on the left anterior leg. Initially trauma. Not felt to have a significant arterial component by vascular surgery. 05/07/16; no major change in the condition of this wound which is still deep, pale initially trauma. The patient has been to see Dr. dew about this in the past I think she has had arterial studies although I don't have access to this. Notes suggested that she does not have a significant PAD problem however this appears to be a significant wound with pale base . I cannot feel her peripheral pulses and her capillary refill time is delayed Electronic Signature(s) Signed: 05/09/2016 12:38:30 PM By: Baltazar Najjarobson, Michael MD Entered By: Baltazar Najjarobson, Michael on 05/08/2016 13:43:28 [name], [name] ([id number]) -------------------------------------------------------------------------------- Physical Exam Details Patient Name: [name], [name] Date of Service: 05/08/2016 12:45 PM Medical Record Patient Account Number: [id number] Number: Treating RN: Clover MealyAfful, RN, BSN, Rita May 26, 1946 [phone](70 y.o. Other Clinician: Date of Birth/Sex: Female) Treating ROBSON, MICHAEL Primary Care Physician/Extender: Comer LocketG HAWKINS JR, JAMES Physician: Referring Physician: Jones BroomHAWKINS JR, JAMES Weeks in Treatment: 4 Notes Wound exam; this is a deep wound on the lateral aspect of her left leg give. There is really no viable surface to support healing in this is down almost to the fascial layer of her leg. I'm going to refer her back to vascular surgery to have them go over there noninvasive tests to see if PAD is playing a significant role in this. This was initially a  trauma wound. There is no evidence of surrounding infection Electronic Signature(s) Signed: 05/09/2016 12:38:30 PM By: Baltazar Najjarobson, Michael MD Entered By: Baltazar Najjarobson, Michael on 05/08/2016 13:44:56 Reddix, Ayrabella HMarland Kitchen. ([id number]) -------------------------------------------------------------------------------- Physician Orders Details Patient Name: [name], [name] Date of Service: 05/08/2016 12:45 PM Medical Record Patient Account Number: [id number] Number: Treating RN: Clover MealyAfful, RN, BSN, Rita May 26, 1946 [phone](70 y.o. Other Clinician: Date of Birth/Sex: Female) Treating ROBSON, MICHAEL Primary Care Physician/Extender: Comer LocketG HAWKINS JR, JAMES Physician: Referring Physician: Jones BroomHAWKINS JR, JAMES Weeks in Treatment: 4 Verbal / Phone Orders: Yes Clinician: Afful, RN, BSN, Rita Read Back and Verified: Yes Diagnosis Coding Wound Cleansing Wound #1 Left,Lateral Lower Leg o May shower with protection. o No tub bath. o Other: - DO not remove dressing Anesthetic Wound #1 Left,Lateral Lower Leg o Topical Lidocaine 4% cream applied to wound bed prior to debridement Primary Wound Dressing Wound #1 Left,Lateral Lower Leg o Santyl Ointment Secondary Dressing Wound #1 Left,Lateral Lower Leg o Conform/Kerlix Dressing Change Frequency Wound #1 Left,Lateral Lower Leg o Change dressing every day. Follow-up Appointments Wound #1 Left,Lateral Lower Leg o Return Appointment in 2 weeks. Edema Control Wound #1 Left,Lateral Lower Leg o Elevate legs to the level of the heart and pump ankles as often as possible Additional Orders / Instructions Wound #1 Left,Lateral Lower Leg Hyams, Flois H. ([id number]) o Increase protein intake. o OK to return to work with the following restrictions: o Activity as tolerated Oxygen Administration Wound #1 Left,Lateral Lower Leg o While patient is in clinic, provide supplimental oxygen via nasal cannula at liters/min __ : - 2liters Medications-please add to  medication list. Wound #1 Left,Lateral Lower Leg o Santyl Enzymatic Ointment - Script given to patient Consults o Vascular - Arterial studies Electronic Signature(s) Signed: 05/08/2016 3:01:26 PM By: Elpidio EricAfful, Rita BSN, RN Signed: 05/09/2016 12:38:30 PM By:  Baltazar Najjar MD Entered By: Elpidio Eric on 05/08/2016 13:26:37 [name], Savannah Reeks ([id number]) -------------------------------------------------------------------------------- Prescription 05/08/2016 Patient Name: Keehan, Lavon H. Physician: Maxwell Caul MD Date of Birth: February 22, 1946 NPI#: [id number] Sex: F DEA#: [id number] Phone #: [phone] License #: [id number] Patient Address: [facility] [address] [place], [place] [address] [facility] [address], [address] [place], [place] [address] [phone] Allergies PCN Physician's Orders Santyl Ointment Signature(s): Date(s): DURINDA, BUZZELLI ([id number]) Prescription 05/08/2016 Patient Name: [name], Savannah SHED. Physician: Maxwell Caul MD Date of Birth: Dec 06, 1945 NPI#: [id number] Sex: F DEA#: [id number] Phone #: [phone] License #: [id number] Patient Address: [facility] [address] [place], [place] [address] [facility] [address], [address] [place], [place] [address] [phone] Allergies PCN Physician's Orders Santyl Enzymatic Ointment - Script given to patient Signature(s): Date(s): Electronic Signature(s) Signed: 05/09/2016 12:38:30 PM By: Baltazar Najjar MD Entered By: Baltazar Najjar on 05/08/2016 13:47:11 [name], Savannah HMarland Kitchen ([id number]) --------------------------------------------------------------------------------  Problem List Details Patient Name: Fails, Anielle H. Date of Service: 05/08/2016 12:45 PM Medical Record Patient Account Number: [id number] Number: Treating RN: Clover Mealy, RN, BSN, Rita 1945/12/10 [phone] y.o.  Other Clinician: Date of Birth/Sex: Female) Treating ROBSON, MICHAEL Primary Care Physician/Extender: Comer Locket Physician: Referring Physician: Jones Broom in Treatment: 4 Active Problems ICD-10 Encounter Code Description Active Date Diagnosis E11.622 Type 2 diabetes mellitus with other skin ulcer 04/10/2016 Yes E11.51 Type 2 diabetes mellitus with diabetic peripheral 04/10/2016 Yes angiopathy without gangrene S81.832D Puncture wound without foreign body, left lower leg, 04/10/2016 Yes subsequent encounter Inactive Problems Resolved Problems Electronic Signature(s) Signed: 05/09/2016 12:38:30 PM By: Baltazar Najjar MD Entered By: Baltazar Najjar on 05/08/2016 13:41:23 Rudin, Alexiss H. ([id number]) -------------------------------------------------------------------------------- Progress Note Details Patient Name: Vallo, Ilyssa H. Date of Service: 05/08/2016 12:45 PM Medical Record Patient Account Number: [id number] Number: Treating RN: Clover Mealy, RN, BSN, Rita 1946-04-18 [phone] y.o. Other Clinician: Date of Birth/Sex: Female) Treating ROBSON, MICHAEL Primary Care Physician/Extender: Comer Locket Physician: Referring Physician: Jones Broom in Treatment: 4 Subjective Chief Complaint Information obtained from Patient Patient is here for a nonhealing wound on her left lateral leg which is been present for a month History of Present Illness (HPI) 04/10/16; this is a 70 year old woman who lives in [place] next to a sister who helps care for her. She traumatized her left leg roughly a month ago on the bed frame of a hospital bed she has at home. I description predominantly of her sister it sounds as though she had a laceration with a skin flap. The skin did not hold together and it came off the surface of the wound. She was seen by her primary care physician last week who noted a black eschar on the top of the wound. Prescribed a topical cream  as well as cephalexin for erythema around the wound. It sounds as though the black eschar came off the wound leaving an open wound on the left leg. She has been using Neosporin as a primary wound dressing for most of the last month. She does not have a prior wound history. The patient is followed by Dr. Wyn Quaker vascular surgery and it sounds as though at some point she had a procedure on her left leg possibly a stent. ABI in this clinic was 0.9 on the left with monophasic waveforms. She is a diabetic with last hemoglobin A1c at 6.8  on 03/19/16. She does not have a known history of neuropathy 04/17/16; no major change in the condition of this wound on her left anterior lateral leg. There is mild erythema but no tenderness and I don't think there is any evidence of infection area we still don't have Dr. Kristeen Mans vascular surgery notes 04/24/16; no major change in the condition of this wound on the left anterior leg. Initially trauma. Not felt to have a significant arterial component by vascular surgery. 05/07/16; no major change in the condition of this wound which is still deep, pale initially trauma. The patient has been to see Dr. dew about this in the past I think she has had arterial studies although I don't have access to this. Notes suggested that she does not have a significant PAD problem however this appears to be a significant wound with pale base . I cannot feel her peripheral pulses and her capillary refill time is delayed Maulding, Danine H. ([id number]) Objective Constitutional Vitals Time Taken: 12:57 PM, Height: 62 in, Weight: 157 lbs, BMI: 28.7, Temperature: 97.6 F, Pulse: 88 bpm, Respiratory Rate: 18 breaths/min, Blood Pressure: 125/73 mmHg. Integumentary (Hair, Skin) Wound #1 status is Open. Original cause of wound was Trauma. The wound is located on the Left,Lateral Lower Leg. The wound measures 4.8cm length x 3.3cm width x 0.3cm depth; 12.441cm^2 area and 3.732cm^3 volume. The wound is  limited to skin breakdown. There is no tunneling or undermining noted. There is a large amount of serosanguineous drainage noted. The wound margin is distinct with the outline attached to the wound base. There is small (1-33%) pink, pale granulation within the wound bed. There is a large (67-100%) amount of necrotic tissue within the wound bed including Adherent Slough. The periwound skin appearance exhibited: Localized Edema, Moist, Mottled. The periwound skin appearance did not exhibit: Callus, Crepitus, Excoriation, Fluctuance, Friable, Induration, Rash, Scarring, Dry/Scaly, Maceration, Atrophie Blanche, Cyanosis, Ecchymosis, Hemosiderin Staining, Pallor, Rubor, Erythema. Periwound temperature was noted as No Abnormality. The periwound has tenderness on palpation. 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 There was a Skin/Subcutaneous Tissue Debridement (40981-19147) debridement with total area of 15.84 sq cm performed by Maxwell Caul, MD. with the following instrument(s): Curette to remove Non-Viable tissue/material including Fibrin/Slough and Subcutaneous after achieving pain control using Lidocaine 4% Topical Solution. A time out was conducted prior to the start of the procedure. A Moderate amount of bleeding was controlled with Silver Nitrate. The procedure was tolerated well with a pain level of 0 throughout and a pain level of 0 following the procedure. Post Debridement Measurements: 4.8cm length x 3.3cm width x 0.3cm depth; 3.732cm^3 volume. Post procedure Diagnosis Wound #1: Same as Pre-Procedure Entsminger, Yumalay H. ([id number]) Plan Wound Cleansing: Wound #1 Left,Lateral Lower  Leg: May shower with protection. No tub bath. Other: - DO not remove dressing Anesthetic: Wound #1 Left,Lateral Lower Leg: Topical Lidocaine 4% cream applied to wound bed prior to debridement Primary Wound Dressing: Wound #1 Left,Lateral Lower Leg: Santyl Ointment Secondary Dressing: Wound #1 Left,Lateral Lower Leg: Conform/Kerlix Dressing Change Frequency: Wound #1 Left,Lateral Lower Leg: Change dressing every day. Follow-up Appointments: Wound #1 Left,Lateral Lower Leg: Return Appointment in 2 weeks. Edema Control: Wound #1 Left,Lateral Lower Leg: Elevate  legs to the level of the heart and pump ankles as often as possible Additional Orders / Instructions: Wound #1 Left,Lateral Lower Leg: Increase protein intake. OK to return to work with the following restrictions: Activity as tolerated Oxygen Administration: Wound #1 Left,Lateral Lower Leg: While patient is in clinic, provide supplimental oxygen via nasal cannula at liters/min __ : - 2liters Medications-please add to medication list.: Wound #1 Left,Lateral Lower Leg: Santyl Enzymatic Ointment - Script given to patient Consults ordered were: Vascular - Arterial studies Clerk, Oliver H. ([id number]) Continue with santyl that the patient and her sister are changing daily at home,kerlix and conform To see Dr Due for arterial review Electronic Signature(s) Signed: 05/09/2016 12:38:30 PM By: Baltazar Najjar MD Entered By: Baltazar Najjar on 05/08/2016 13:46:18 Pacey, Teruko H. ([id number]) -------------------------------------------------------------------------------- SuperBill Details Patient Name: Voges, Lizanne H. Date of Service: 05/08/2016 Medical Record Patient Account Number: [id number] Number: Treating RN: Clover Mealy, RN, BSN, Rita 17-Mar-1946 [phone] y.o. 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 S81.832D Puncture wound without foreign body, left lower leg, subsequent encounter Facility Procedures CPT4 Code: [id number] Description: 11042 - DEB SUBQ TISSUE 20 SQ CM/< ICD-10 Description Diagnosis E11.622 Type 2 diabetes mellitus with other skin ulcer Modifier: Quantity: 1 Physician Procedures CPT4 Code: [id number] Description: 11042 - WC PHYS SUBQ TISS 20 SQ CM ICD-10 Description Diagnosis E11.622 Type 2 diabetes mellitus with other skin ulcer Modifier: Quantity: 1 Electronic Signature(s) Signed: 05/09/2016 12:38:30 PM By: Baltazar Najjar MD Entered By: Baltazar Najjar on 05/08/2016 13:47:01

## 2016-05-10 ENCOUNTER — Emergency Department: Payer: Medicare Other

## 2016-05-10 ENCOUNTER — Encounter: Payer: Self-pay | Admitting: Emergency Medicine

## 2016-05-10 ENCOUNTER — Inpatient Hospital Stay
Admission: EM | Admit: 2016-05-10 | Discharge: 2016-05-16 | DRG: 871 | Disposition: A | Discharge status: Home / Self Care | Payer: Medicare Other | Attending: Internal Medicine | Admitting: Internal Medicine

## 2016-05-10 DIAGNOSIS — Z823 Family history of stroke: Secondary | ICD-10-CM

## 2016-05-10 DIAGNOSIS — I2510 Atherosclerotic heart disease of native coronary artery without angina pectoris: Secondary | ICD-10-CM | POA: Diagnosis not present

## 2016-05-10 DIAGNOSIS — Z87891 Personal history of nicotine dependence: Secondary | ICD-10-CM

## 2016-05-10 DIAGNOSIS — Z7901 Long term (current) use of anticoagulants: Secondary | ICD-10-CM | POA: Diagnosis not present

## 2016-05-10 DIAGNOSIS — L97829 Non-pressure chronic ulcer of other part of left lower leg with unspecified severity: Secondary | ICD-10-CM | POA: Diagnosis present

## 2016-05-10 DIAGNOSIS — J189 Pneumonia, unspecified organism: Secondary | ICD-10-CM | POA: Diagnosis not present

## 2016-05-10 DIAGNOSIS — R000 Tachycardia, unspecified: Secondary | ICD-10-CM | POA: Diagnosis not present

## 2016-05-10 DIAGNOSIS — J690 Pneumonitis due to inhalation of food and vomit: Secondary | ICD-10-CM | POA: Diagnosis not present

## 2016-05-10 DIAGNOSIS — T380X5A Adverse effect of glucocorticoids and synthetic analogues, initial encounter: Secondary | ICD-10-CM | POA: Diagnosis present

## 2016-05-10 DIAGNOSIS — R05 Cough: Secondary | ICD-10-CM | POA: Diagnosis not present

## 2016-05-10 DIAGNOSIS — A419 Sepsis, unspecified organism: Principal | ICD-10-CM

## 2016-05-10 DIAGNOSIS — Z79899 Other long term (current) drug therapy: Secondary | ICD-10-CM

## 2016-05-10 DIAGNOSIS — Z66 Do not resuscitate: Secondary | ICD-10-CM | POA: Diagnosis present

## 2016-05-10 DIAGNOSIS — Z955 Presence of coronary angioplasty implant and graft: Secondary | ICD-10-CM | POA: Diagnosis not present

## 2016-05-10 DIAGNOSIS — R791 Abnormal coagulation profile: Secondary | ICD-10-CM | POA: Diagnosis not present

## 2016-05-10 DIAGNOSIS — I4891 Unspecified atrial fibrillation: Secondary | ICD-10-CM

## 2016-05-10 DIAGNOSIS — J441 Chronic obstructive pulmonary disease with (acute) exacerbation: Secondary | ICD-10-CM | POA: Diagnosis present

## 2016-05-10 DIAGNOSIS — Z96649 Presence of unspecified artificial hip joint: Secondary | ICD-10-CM | POA: Diagnosis not present

## 2016-05-10 DIAGNOSIS — J440 Chronic obstructive pulmonary disease with acute lower respiratory infection: Secondary | ICD-10-CM | POA: Diagnosis present

## 2016-05-10 DIAGNOSIS — J9600 Acute respiratory failure, unspecified whether with hypoxia or hypercapnia: Secondary | ICD-10-CM

## 2016-05-10 DIAGNOSIS — J9621 Acute and chronic respiratory failure with hypoxia: Secondary | ICD-10-CM

## 2016-05-10 DIAGNOSIS — Z9981 Dependence on supplemental oxygen: Secondary | ICD-10-CM | POA: Diagnosis not present

## 2016-05-10 DIAGNOSIS — J449 Chronic obstructive pulmonary disease, unspecified: Secondary | ICD-10-CM | POA: Diagnosis not present

## 2016-05-10 DIAGNOSIS — Z7951 Long term (current) use of inhaled steroids: Secondary | ICD-10-CM

## 2016-05-10 DIAGNOSIS — J151 Pneumonia due to Pseudomonas: Secondary | ICD-10-CM | POA: Diagnosis present

## 2016-05-10 DIAGNOSIS — R0602 Shortness of breath: Secondary | ICD-10-CM | POA: Diagnosis not present

## 2016-05-10 DIAGNOSIS — Z880 Allergy status to penicillin: Secondary | ICD-10-CM | POA: Diagnosis not present

## 2016-05-10 DIAGNOSIS — Z803 Family history of malignant neoplasm of breast: Secondary | ICD-10-CM | POA: Diagnosis not present

## 2016-05-10 DIAGNOSIS — F419 Anxiety disorder, unspecified: Secondary | ICD-10-CM | POA: Diagnosis present

## 2016-05-10 DIAGNOSIS — I10 Essential (primary) hypertension: Secondary | ICD-10-CM | POA: Diagnosis present

## 2016-05-10 DIAGNOSIS — E875 Hyperkalemia: Secondary | ICD-10-CM | POA: Diagnosis not present

## 2016-05-10 DIAGNOSIS — R0600 Dyspnea, unspecified: Secondary | ICD-10-CM

## 2016-05-10 DIAGNOSIS — Z7984 Long term (current) use of oral hypoglycemic drugs: Secondary | ICD-10-CM

## 2016-05-10 DIAGNOSIS — Z8249 Family history of ischemic heart disease and other diseases of the circulatory system: Secondary | ICD-10-CM | POA: Diagnosis not present

## 2016-05-10 DIAGNOSIS — Z9889 Other specified postprocedural states: Secondary | ICD-10-CM | POA: Diagnosis not present

## 2016-05-10 DIAGNOSIS — Z515 Encounter for palliative care: Secondary | ICD-10-CM | POA: Diagnosis not present

## 2016-05-10 LAB — CBC WITH DIFFERENTIAL/PLATELET
Basophils Absolute: 0 10*3/uL (ref 0–0.1)
Basophils Relative: 0 %
Eosinophils Absolute: 0 10*3/uL (ref 0–0.7)
HEMATOCRIT: 38.5 % (ref 35.0–47.0)
Hemoglobin: 12.9 g/dL (ref 12.0–16.0)
LYMPHS ABS: 0.9 10*3/uL — AB (ref 1.0–3.6)
MCH: 31.6 pg (ref 26.0–34.0)
MCHC: 33.6 g/dL (ref 32.0–36.0)
MCV: 94.1 fL (ref 80.0–100.0)
MONO ABS: 1.1 10*3/uL — AB (ref 0.2–0.9)
NEUTROS ABS: 22.4 10*3/uL — AB (ref 1.4–6.5)
Neutrophils Relative %: 91 %
PLATELETS: 285 10*3/uL (ref 150–440)
RBC: 4.09 MIL/uL (ref 3.80–5.20)
RDW: 14.7 % — AB (ref 11.5–14.5)
WBC: 24.5 10*3/uL — ABNORMAL HIGH (ref 3.6–11.0)

## 2016-05-10 LAB — URINALYSIS COMPLETE WITH MICROSCOPIC (ARMC ONLY)
Bilirubin Urine: NEGATIVE
GLUCOSE, UA: NEGATIVE mg/dL
Ketones, ur: NEGATIVE mg/dL
Leukocytes, UA: NEGATIVE
NITRITE: NEGATIVE
Protein, ur: 100 mg/dL — AB
SPECIFIC GRAVITY, URINE: 1.009 (ref 1.005–1.030)
pH: 7 (ref 5.0–8.0)

## 2016-05-10 LAB — BASIC METABOLIC PANEL
Anion gap: 9 (ref 5–15)
BUN: 19 mg/dL (ref 6–20)
CALCIUM: 8.4 mg/dL — AB (ref 8.9–10.3)
CO2: 30 mmol/L (ref 22–32)
Chloride: 96 mmol/L — ABNORMAL LOW (ref 101–111)
Creatinine, Ser: 0.72 mg/dL (ref 0.44–1.00)
GFR calc Af Amer: >60 mL/min (ref >60)
GLUCOSE: 194 mg/dL — AB (ref 65–99)
Potassium: 4.5 mmol/L (ref 3.5–5.1)
Sodium: 135 mmol/L (ref 135–145)

## 2016-05-10 LAB — PROTIME-INR
INR: 4.25 — AB
Prothrombin Time: 39.8 seconds — ABNORMAL HIGH (ref 11.4–15.0)

## 2016-05-10 LAB — TROPONIN I: Troponin I: 0.03 ng/mL (ref <0.03)

## 2016-05-10 LAB — EXPECTORATED SPUTUM ASSESSMENT W REFEX TO RESP CULTURE

## 2016-05-10 LAB — APTT: APTT: 45 s — AB (ref 24–36)

## 2016-05-10 LAB — EXPECTORATED SPUTUM ASSESSMENT W GRAM STAIN, RFLX TO RESP C

## 2016-05-10 LAB — GLUCOSE, CAPILLARY
Glucose-Capillary: 182 mg/dL — ABNORMAL HIGH (ref 65–99)
Glucose-Capillary: 274 mg/dL — ABNORMAL HIGH (ref 65–99)
Glucose-Capillary: 264 mg/dL — ABNORMAL HIGH (ref 65–99)

## 2016-05-10 LAB — MRSA PCR SCREENING: MRSA BY PCR: NEGATIVE

## 2016-05-10 LAB — BRAIN NATRIURETIC PEPTIDE: B Natriuretic Peptide: 422 pg/mL — ABNORMAL HIGH (ref 0.0–100.0)

## 2016-05-10 LAB — LIPASE, BLOOD: LIPASE: 22 U/L (ref 11–51)

## 2016-05-10 LAB — LACTIC ACID, PLASMA: LACTIC ACID, VENOUS: 1.4 mmol/L (ref 0.5–1.9)

## 2016-05-10 MED ORDER — POTASSIUM CHLORIDE CRYS ER 20 MEQ PO TBCR
20.0000 meq | EXTENDED_RELEASE_TABLET | Freq: Two times a day (BID) | ORAL | Status: DC
  Administered 2016-05-10 – 2016-05-12 (×6): 20 meq via ORAL
  Filled 2016-05-10 (×6): qty 1

## 2016-05-10 MED ORDER — DILTIAZEM HCL ER COATED BEADS 180 MG PO CP24: 180.0000 mg | ORAL_CAPSULE | Freq: Every day | ORAL | Status: DC

## 2016-05-10 MED ORDER — PRAVASTATIN SODIUM 40 MG PO TABS
80.0000 mg | ORAL_TABLET | Freq: Every day | ORAL | Status: DC
  Administered 2016-05-10 – 2016-05-16 (×7): 80 mg via ORAL
  Filled 2016-05-10: qty 4
  Filled 2016-05-10: qty 2
  Filled 2016-05-10 (×3): qty 4
  Filled 2016-05-10 (×2): qty 2

## 2016-05-10 MED ORDER — INSULIN ASPART 100 UNIT/ML ~~LOC~~ SOLN
0.0000 [IU] | Freq: Every day | SUBCUTANEOUS | Status: DC
  Administered 2016-05-10 – 2016-05-11 (×2): 3 [IU] via SUBCUTANEOUS
  Administered 2016-05-12: 2 [IU] via SUBCUTANEOUS
  Filled 2016-05-10: qty 2
  Filled 2016-05-10 (×2): qty 3
  Filled 2016-05-10: qty 4

## 2016-05-10 MED ORDER — DILTIAZEM HCL 100 MG IV SOLR
5.0000 mg/h | INTRAVENOUS | Status: DC
  Administered 2016-05-10: 5 mg/h via INTRAVENOUS
  Administered 2016-05-10: 15 mg/h via INTRAVENOUS
  Administered 2016-05-11: 10 mg/h via INTRAVENOUS
  Filled 2016-05-10 (×3): qty 100

## 2016-05-10 MED ORDER — IPRATROPIUM-ALBUTEROL 0.5-2.5 (3) MG/3ML IN SOLN
3.0000 mL | RESPIRATORY_TRACT | Status: DC
  Administered 2016-05-10 – 2016-05-14 (×25): 3 mL via RESPIRATORY_TRACT
  Filled 2016-05-10 (×25): qty 3

## 2016-05-10 MED ORDER — METHYLPREDNISOLONE SODIUM SUCC 125 MG IJ SOLR
125.0000 mg | Freq: Once | INTRAMUSCULAR | Status: AC
  Administered 2016-05-10: 125 mg via INTRAVENOUS
  Filled 2016-05-10: qty 2

## 2016-05-10 MED ORDER — LEVOFLOXACIN IN D5W 500 MG/100ML IV SOLN
500.0000 mg | INTRAVENOUS | Status: DC
  Administered 2016-05-11: 500 mg via INTRAVENOUS
  Filled 2016-05-10: qty 100

## 2016-05-10 MED ORDER — SODIUM CHLORIDE 0.9 % IV BOLUS (SEPSIS)
1000.0000 mL | Freq: Once | INTRAVENOUS | Status: AC
  Administered 2016-05-10: 1000 mL via INTRAVENOUS

## 2016-05-10 MED ORDER — WARFARIN SODIUM 3 MG PO TABS: 3.0000 mg | ORAL_TABLET | ORAL | Status: DC

## 2016-05-10 MED ORDER — IPRATROPIUM-ALBUTEROL 0.5-2.5 (3) MG/3ML IN SOLN: 3.0000 mL | RESPIRATORY_TRACT | Status: DC

## 2016-05-10 MED ORDER — ONDANSETRON HCL 4 MG/2ML IJ SOLN
4.0000 mg | Freq: Four times a day (QID) | INTRAMUSCULAR | Status: DC | PRN
  Administered 2016-05-14 – 2016-05-15 (×2): 4 mg via INTRAVENOUS
  Filled 2016-05-10 (×2): qty 2

## 2016-05-10 MED ORDER — ALPRAZOLAM 0.25 MG PO TABS: 0.2500 mg | ORAL_TABLET | Freq: Two times a day (BID) | ORAL | Status: DC | PRN

## 2016-05-10 MED ORDER — MOMETASONE FURO-FORMOTEROL FUM 200-5 MCG/ACT IN AERO
2.0000 | INHALATION_SPRAY | Freq: Two times a day (BID) | RESPIRATORY_TRACT | Status: DC
  Administered 2016-05-10 – 2016-05-16 (×13): 2 via RESPIRATORY_TRACT
  Filled 2016-05-10 (×2): qty 8.8

## 2016-05-10 MED ORDER — ALBUTEROL SULFATE (2.5 MG/3ML) 0.083% IN NEBU
5.0000 mg | INHALATION_SOLUTION | Freq: Once | RESPIRATORY_TRACT | Status: AC
  Administered 2016-05-10: 5 mg via RESPIRATORY_TRACT
  Filled 2016-05-10: qty 6

## 2016-05-10 MED ORDER — IPRATROPIUM-ALBUTEROL 0.5-2.5 (3) MG/3ML IN SOLN
3.0000 mL | Freq: Once | RESPIRATORY_TRACT | Status: AC
  Administered 2016-05-10: 6 mL via RESPIRATORY_TRACT
  Filled 2016-05-10: qty 3

## 2016-05-10 MED ORDER — DILTIAZEM LOAD VIA INFUSION
10.0000 mg | Freq: Once | INTRAVENOUS | Status: AC
  Administered 2016-05-10: 10 mg via INTRAVENOUS
  Filled 2016-05-10: qty 10

## 2016-05-10 MED ORDER — ACETAMINOPHEN 325 MG PO TABS: 650.0000 mg | ORAL_TABLET | Freq: Four times a day (QID) | ORAL | Status: DC | PRN

## 2016-05-10 MED ORDER — SODIUM CHLORIDE 0.9 % IV SOLN
INTRAVENOUS | Status: DC
  Administered 2016-05-10 – 2016-05-11 (×3): via INTRAVENOUS

## 2016-05-10 MED ORDER — METOPROLOL TARTRATE 100 MG PO TABS
100.0000 mg | ORAL_TABLET | Freq: Two times a day (BID) | ORAL | Status: DC
Start: 2016-05-10 — End: 2016-05-16
  Administered 2016-05-10 – 2016-05-16 (×12): 100 mg via ORAL
  Filled 2016-05-10: qty 1
  Filled 2016-05-10 (×2): qty 2
  Filled 2016-05-10 (×3): qty 1
  Filled 2016-05-10: qty 2
  Filled 2016-05-10: qty 1
  Filled 2016-05-10 (×2): qty 2
  Filled 2016-05-10 (×2): qty 1
  Filled 2016-05-10 (×2): qty 2

## 2016-05-10 MED ORDER — CETYLPYRIDINIUM CHLORIDE 0.05 % MT LIQD
7.0000 mL | Freq: Two times a day (BID) | OROMUCOSAL | Status: DC
  Administered 2016-05-11 – 2016-05-16 (×6): 7 mL via OROMUCOSAL

## 2016-05-10 MED ORDER — SODIUM CHLORIDE 0.9 % IV BOLUS (SEPSIS)
250.0000 mL | Freq: Once | INTRAVENOUS | Status: AC
  Administered 2016-05-10: 250 mL via INTRAVENOUS

## 2016-05-10 MED ORDER — ALBUTEROL SULFATE (2.5 MG/3ML) 0.083% IN NEBU: 2.5000 mg | INHALATION_SOLUTION | Freq: Four times a day (QID) | RESPIRATORY_TRACT | Status: DC | PRN

## 2016-05-10 MED ORDER — METHYLPREDNISOLONE SODIUM SUCC 125 MG IJ SOLR
60.0000 mg | Freq: Three times a day (TID) | INTRAMUSCULAR | Status: DC
  Administered 2016-05-10 – 2016-05-14 (×13): 60 mg via INTRAVENOUS
  Filled 2016-05-10 (×13): qty 2

## 2016-05-10 MED ORDER — LEVOFLOXACIN IN D5W 500 MG/100ML IV SOLN
500.0000 mg | Freq: Once | INTRAVENOUS | Status: AC
  Administered 2016-05-10: 500 mg via INTRAVENOUS
  Filled 2016-05-10: qty 100

## 2016-05-10 MED ORDER — CHLORHEXIDINE GLUCONATE 0.12 % MT SOLN
15.0000 mL | Freq: Two times a day (BID) | OROMUCOSAL | Status: DC
  Administered 2016-05-10 – 2016-05-16 (×10): 15 mL via OROMUCOSAL
  Filled 2016-05-10 (×9): qty 15

## 2016-05-10 MED ORDER — INSULIN ASPART 100 UNIT/ML ~~LOC~~ SOLN
SUBCUTANEOUS | Status: AC
  Administered 2016-05-10: 4 [IU] via SUBCUTANEOUS
  Filled 2016-05-10: qty 4

## 2016-05-10 MED ORDER — ALBUTEROL SULFATE (2.5 MG/3ML) 0.083% IN NEBU: 2.5000 mg | INHALATION_SOLUTION | RESPIRATORY_TRACT | Status: DC

## 2016-05-10 MED ORDER — TIOTROPIUM BROMIDE MONOHYDRATE 18 MCG IN CAPS
18.0000 ug | ORAL_CAPSULE | Freq: Every day | RESPIRATORY_TRACT | Status: DC
  Administered 2016-05-10 – 2016-05-16 (×7): 18 ug via RESPIRATORY_TRACT
  Filled 2016-05-10 (×2): qty 5

## 2016-05-10 MED ORDER — ALBUTEROL SULFATE (2.5 MG/3ML) 0.083% IN NEBU
INHALATION_SOLUTION | RESPIRATORY_TRACT | Status: AC
  Filled 2016-05-10: qty 3

## 2016-05-10 MED ORDER — DOCUSATE SODIUM 100 MG PO CAPS
100.0000 mg | ORAL_CAPSULE | Freq: Two times a day (BID) | ORAL | Status: DC
  Administered 2016-05-10 – 2016-05-16 (×11): 100 mg via ORAL
  Filled 2016-05-10 (×13): qty 1

## 2016-05-10 MED ORDER — ACETAMINOPHEN 650 MG RE SUPP: 650.0000 mg | Freq: Four times a day (QID) | RECTAL | Status: DC | PRN

## 2016-05-10 MED ORDER — INSULIN ASPART 100 UNIT/ML ~~LOC~~ SOLN
0.0000 [IU] | Freq: Three times a day (TID) | SUBCUTANEOUS | Status: DC
  Administered 2016-05-10: 4 [IU] via SUBCUTANEOUS
  Administered 2016-05-10: 11 [IU] via SUBCUTANEOUS
  Administered 2016-05-11: 15 [IU] via SUBCUTANEOUS
  Administered 2016-05-11 (×2): 4 [IU] via SUBCUTANEOUS
  Administered 2016-05-12: 11 [IU] via SUBCUTANEOUS
  Administered 2016-05-12: 4 [IU] via SUBCUTANEOUS
  Administered 2016-05-12: 7 [IU] via SUBCUTANEOUS
  Administered 2016-05-13: 15 [IU] via SUBCUTANEOUS
  Administered 2016-05-13: 4 [IU] via SUBCUTANEOUS
  Administered 2016-05-13 – 2016-05-14 (×2): 7 [IU] via SUBCUTANEOUS
  Administered 2016-05-14: 4 [IU] via SUBCUTANEOUS
  Administered 2016-05-14: 7 [IU] via SUBCUTANEOUS
  Administered 2016-05-15: 3 [IU] via SUBCUTANEOUS
  Administered 2016-05-15 (×2): 4 [IU] via SUBCUTANEOUS
  Filled 2016-05-10 (×2): qty 15
  Filled 2016-05-10: qty 4
  Filled 2016-05-10: qty 7
  Filled 2016-05-10: qty 3
  Filled 2016-05-10: qty 7
  Filled 2016-05-10: qty 4
  Filled 2016-05-10: qty 7
  Filled 2016-05-10: qty 4
  Filled 2016-05-10: qty 11
  Filled 2016-05-10 (×2): qty 4
  Filled 2016-05-10: qty 11
  Filled 2016-05-10: qty 4
  Filled 2016-05-10: qty 7
  Filled 2016-05-10: qty 4

## 2016-05-10 MED ORDER — ONDANSETRON HCL 4 MG PO TABS: 4.0000 mg | ORAL_TABLET | Freq: Four times a day (QID) | ORAL | Status: DC | PRN

## 2016-05-10 MED ORDER — LISINOPRIL 20 MG PO TABS
40.0000 mg | ORAL_TABLET | Freq: Every day | ORAL | Status: DC
  Administered 2016-05-11 – 2016-05-12 (×2): 40 mg via ORAL
  Filled 2016-05-10 (×2): qty 2

## 2016-05-10 MED ORDER — IPRATROPIUM-ALBUTEROL 0.5-2.5 (3) MG/3ML IN SOLN
RESPIRATORY_TRACT | Status: AC
  Administered 2016-05-10: 6 mL via RESPIRATORY_TRACT
  Filled 2016-05-10: qty 6

## 2016-05-10 MED ORDER — WARFARIN - PHARMACIST DOSING INPATIENT
Freq: Every day | Status: DC
  Administered 2016-05-11 – 2016-05-15 (×3)
  Filled 2016-05-10 (×7): qty 1

## 2016-05-10 MED ORDER — DILTIAZEM HCL 25 MG/5ML IV SOLN
INTRAVENOUS | Status: AC
  Administered 2016-05-10: 10 mg
  Filled 2016-05-10: qty 5

## 2016-05-10 MED ORDER — FUROSEMIDE 20 MG PO TABS
20.0000 mg | ORAL_TABLET | Freq: Every day | ORAL | Status: DC
  Administered 2016-05-10 – 2016-05-16 (×7): 20 mg via ORAL
  Filled 2016-05-10 (×7): qty 1

## 2016-05-10 NOTE — ED Provider Notes (Signed)
[facility] Emergency Department Provider Note  ____________________________________________  Time seen: 6:50 AM  I have reviewed the triage vital signs and the nursing notes.   HISTORY  Chief Complaint Shortness of Breath      HPI [name] is a 70 y.o. female with history COPD presents with progressive dyspnea times one week worsening this morning. Per EMS on their arrival the patient's oxygen saturation was 83% on room air status post 3 albuterol nebulized treatments at home without improvement. Patient admits to coughing up copious amounts of sputum over the past week. Patient denies any fever but states that she's been taking Tylenol regularly. Of note patient states that she is currently taking prednisone of which is since her second course.    Past Medical History  Diagnosis Date  . COPD (chronic obstructive pulmonary disease) (HCC) Jan 2014  . A-fib (HCC)   . Coronary artery disease     Patient Active Problem List   Diagnosis Date Noted  . 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 Ear mass 04/15/2015  . Allergic rhinitis 04/03/2015  . A-fib (HCC) 04/03/2015  . Bursitis of elbow 04/03/2015  . Diabetes mellitus, type 2 (HCC) 04/03/2015  . Dyslipidemia 04/03/2015  . Essential (primary) hypertension 04/03/2015  . H/O ear disorder 04/03/2015  . Difficulty hearing 04/03/2015  . Ear drum perforation 04/03/2015  . Renal vascular disease 04/03/2015  . Screening for depression 04/03/2015  . At risk for injury 04/03/2015  . Single vessel coronary artery disease 04/03/2015  . Skin lesion 04/03/2015  . History of anticoagulant therapy 04/03/2015  . Breast lump 09/02/2013  . Lump or mass in breast 04/02/2013  . Family history of breast cancer 04/02/2013  . Abnormal mammogram 09/29/2012  . Laboratory examination 09/10/2012  . Encounter for other specified special examinations 09/10/2012  . Anemia  09/09/2012  . Coronary atherosclerosis 09/09/2012  . Chronic airway obstruction (HCC) 09/09/2012  . Abnormal glucose 09/09/2012  . Hyposmolality and/or hyponatremia 09/09/2012  . Unknown cause of morbidity or mortality 09/09/2012  . Pulmonary disease due to mycobacteria [facility]) 09/09/2012  . Peripheral vascular disease (HCC) 09/09/2012  . Referral of patient 09/09/2012  . BP (high blood pressure) 09/09/2012  . Blood glucose elevated 09/09/2012  . CAFL (chronic airflow limitation) (HCC) 09/09/2012  . Arteriosclerosis of coronary artery 09/09/2012  . Chronic obstructive pulmonary disease (HCC) 09/09/2012  . Below normal amount of sodium in the blood 09/09/2012  . 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 Peripheral arterial occlusive disease (HCC) 09/09/2012  . Taking multiple drugs for chronic disease 09/09/2012  . Encounter for current long-term use of anticoagulants 05/20/2012    Past Surgical History  Procedure Laterality Date  . Spine surgery  1999  . Joint replacement  1994, 1996, 2000    hip  . Coronary stent placement  2007  . Hip surgery    . Back surgery      Current Outpatient Rx  Name  Route  Sig  Dispense  Refill  . albuterol (PROVENTIL) (2.5 MG/3ML) 0.083% nebulizer solution   Nebulization   Take 2.5 mg by nebulization every 6 (six) hours as needed for wheezing.         . budesonide-formoterol (SYMBICORT) 160-4.5 MCG/ACT inhaler   Inhalation   Inhale 2 puffs into the lungs 2 (two) times daily.          . cephALEXin (KEFLEX) 500 MG capsule   Oral  Take 1 capsule (500 mg total) by mouth 2 (two) times daily.   14 capsule   0   . cetirizine (ZYRTEC) 10 MG tablet   Oral   Take 1 tablet (10 mg total) by mouth daily.   90 tablet   3   . diltiazem (CARDIZEM CD) 180 MG 24 hr capsule   Oral   Take 1 capsule (180 mg total) by mouth daily.   90 capsule   3     This prescription was filled today(08/15/2015). Any ...   . furosemide (LASIX)  40 MG tablet   Oral   Take 20 mg by mouth.          Marland Kitchen glucose blood (ACCU-CHEK AVIVA PLUS) test strip      Check sugar 1 to 2 times a day. Dx: E11.9   100 each   11   . lisinopril (PRINIVIL,ZESTRIL) 40 MG tablet   Oral   Take 1 tablet (40 mg total) by mouth daily.   90 tablet   3     This prescription was filled today(08/30/2015). An ...   . magic mouthwash SOLN   Oral   Take 5 mLs by mouth 3 (three) times daily as needed for mouth pain.   100 mL   0   . metFORMIN (GLUCOPHAGE) 500 MG tablet   Oral   Take 1 tablet (500 mg total) by mouth daily.   90 tablet   3   . metoprolol (LOPRESSOR) 100 MG tablet   Oral   Take 1 tablet (100 mg total) by mouth 2 (two) times daily.   180 tablet   3   . ofloxacin (OCUFLOX) 0.3 % ophthalmic solution   Both Ears   Place 5 drops into both ears as needed.      0   . OXYGEN-HELIUM IN   Inhalation   Inhale 2 L into the lungs continuous. 2 liters of oxygen.         . potassium chloride SA (K-DUR,KLOR-CON) 20 MEQ tablet   Oral   Take 20 mEq by mouth 2 (two) times daily.          . predniSONE (DELTASONE) 5 MG tablet   Oral   Take 5 mg by mouth daily.      2   . simvastatin (ZOCOR) 80 MG tablet   Oral   Take 1 tablet (80 mg total) by mouth daily at 6 PM.   90 tablet   3     This prescription was filled today(08/30/2015). An ...   . tiotropium (SPIRIVA) 18 MCG inhalation capsule   Inhalation   Place 18 mcg into inhaler and inhale daily.          Marland Kitchen warfarin (COUMADIN) 2 MG tablet      2 , 2 mg tabs on Tu, Th, Sat, Sun and take 1.5 tab on M, W, F.   45 tablet   6     Allergies Penicillins  Family History  Problem Relation Age of Onset  . Heart failure Father   . Stroke Mother   . Cancer Sister 65    breast    Social History Social History  Substance Use Topics  . Smoking status: Former Smoker -- 2.00 packs/day for 40 years    Types: Cigarettes  . Smokeless tobacco: Never Used  . Alcohol Use: No     Review of Systems  Constitutional: Negative for fever. Eyes: Negative for visual changes. ENT: Negative for sore throat.  Cardiovascular: Negative for chest pain. Respiratory: Positive for shortness of breath. And cough Gastrointestinal: Negative for abdominal pain, vomiting and diarrhea. Genitourinary: Negative for dysuria. Musculoskeletal: Negative for back pain. Skin: Negative for rash. Neurological: Negative for headaches, focal weakness or numbness.   10-point ROS otherwise negative.  ____________________________________________   PHYSICAL EXAM:  VITAL SIGNS: ED Triage Vitals  Enc Vitals Group     BP 05/10/16 0655 149/110 mmHg     Pulse Rate 05/10/16 0655 134     Resp 05/10/16 0655 30     Temp 05/10/16 0655 97.9 F (36.6 C)     Temp Source 05/10/16 0655 Oral     SpO2 05/10/16 0652 83 %     Weight 05/10/16 0655 155 lb (70.308 kg)     Height 05/10/16 0655 5\' 2"  (1.575 m)     Head Cir --      Peak Flow --      Pain Score --      Pain Loc --      Pain Edu? --      Excl. in GC? --      Constitutional: Alert and oriented. Apparent respiratory distress Eyes: Conjunctivae are normal. PERRL. Normal extraocular movements. ENT   Head: Normocephalic and atraumatic.   Nose: No congestion/rhinnorhea.   Mouth/Throat: Mucous membranes are moist.   Neck: No stridor. Hematological/Lymphatic/Immunilogical: No cervical lymphadenopathy. Cardiovascular: Tachycardia  Normal and symmetric distal pulses are present in all extremities. No murmurs, rubs, or gallops. Respiratory: Tachypnea, positive accessory respiratory muscle use, diffuse coarse wheezes with rhonchi noted bibasilar.  Gastrointestinal: Soft and nontender. No distention. There is no CVA tenderness. Genitourinary: deferred Musculoskeletal: Nontender with normal range of motion in all extremities. No joint effusions.  No lower extremity tenderness nor edema. Neurologic:  Normal speech and language. No  gross focal neurologic deficits are appreciated. Speech is normal.  Skin:  Skin is warm, dry and intact. No rash noted. Psychiatric: Mood and affect are normal. Speech and behavior are normal. Patient exhibits appropriate insight and judgment.      ____________________________________________     Procedures   Critical Care performed: CRITICAL CARE Performed by: Darci CurrentANDOLPH N Sueko Dimichele   Total critical care time: 30 minutes  Critical care time was exclusive of separately billable procedures and treating other patients.  Critical care was necessary to treat or prevent imminent or life-threatening deterioration.  Critical care was time spent personally by me on the following activities: development of treatment plan with patient and/or surrogate as well as nursing, discussions with consultants, evaluation of patient's response to treatment, examination of patient, obtaining history from patient or surrogate, ordering and performing treatments and interventions, ordering and review of laboratory studies, ordering and review of radiographic studies, pulse oximetry and re-evaluation of patient's condition.   ____________________________________________   INITIAL IMPRESSION / ASSESSMENT AND PLAN / ED COURSE  Pertinent labs & imaging results that were available during my care of the patient were reviewed by me and considered in my medical decision making (see chart for details).  BiPAP applied after presentation to emergency department secondary to respiratory distress with accessory muscle use concern for potential fatigue. In addition the patient received 2 DuoNeb's and IV Solu-Medrol 125 mg as well as Levaquin suspicion for pneumonia. Patient's care transferred to Dr Scotty CourtStafford with presumed diagnosis of community acquired pneumonia. Lab data, Chest Xray pending  ____________________________________________   FINAL CLINICAL IMPRESSION(S) / ED DIAGNOSES  Final diagnoses:  CAP (community  acquired pneumonia)  Sepsis, due to unspecified  organism [facility](HCC)  Atrial fibrillation with rapid ventricular response (HCC)  Acute on chronic respiratory failure with hypoxia [facility](HCC)      Darci Currentandolph N Asherah Lavoy, MD 05/10/16 2129

## 2016-05-10 NOTE — Progress Notes (Signed)
Pt is alert and oriented, dyspnea on slight exertion.  Pt between 4L [place] and bipap.  VSS,afebrile.  On cardizem drip at 15 mg/hr for HR control.  Voids in bedpan.

## 2016-05-10 NOTE — Consult Note (Signed)
PULMONARY / CRITICAL CARE MEDICINE   Name: [name] MRN: [id number] DOB: 26-May-1946    ADMISSION DATE:  05/10/2016 CONSULTATION DATE:  05/10/16  REFERRING MD: Camillia Herter  CHIEF COMPLAINT:  Shortness of breath    DISCUSSION:  70 year old female with history of diabetes, stage IV COPD now presenting with community-acquired pneumonia. Patient on BiPAP off-and-on  ASSESSMENT / PLAN:  PULMONARY A: Acute hypoxemic respiratory failure Community-acquired pneumonia Stage IV COPD, on home O2 3 L  P:   Continue BiPAP daily at bedtime and as needed to keep O2 sats greater than 88% Continue levofloxacin Methyl prednisone Continue DuoNeb Incentive spirometer/ flutter valve  CARDIOVASCULAR A:  A. fib with RVR History of hypertension History of hyperlipidemia  P:  Diltiazem gtt , taper as able Lisinopril/metoprolol Rest per primary Continue pravastatin Continue lasix  RENAL A:   No active issues  P:   Replace electrolytes per ICU protocol  Chem-7 in am  GASTROINTESTINAL A:   No active issues  P:   Heart healthy diet   HEMATOLOGIC A:   Elevated INR  P:  Hold Coumadin for now  Pharmacy consulted for coumadin dosing Transfuse if Hgb <7   INFECTIOUS A:   Community-acquired pneumonia Leukocytosis Ulcer on left shin P:   Monitor fever curve Antibiotics as above Followed by wound nurse Follow lactic acid  CULTURES: 6/29 sputum culture>> 6/29 urine culture>> 629 blood culture  ANTIBIOTICS:  6/29 levofloxacin  ENDOCRINE A:   Diabetes mellitus P:   Blood sugar checks before meals at bedtime  Sliding scale insulin coverage   NEUROLOGIC A:   anxiety P:   RASS goal: 0 Minimize sedating medications Xanax PRN   SIGNIFICANT EVENTS:  6/29 patient admitted to the ICU on BiPAP with sepsis related to community-acquired pneumonia.  LINES/TUBES: none    -------------------------------------------------  HISTORY OF PRESENT ILLNESS:   [name]  is a 70 year old Caucasian female with past medical history of stage IV COPD with 3 L of home oxygen at baseline, atrial fibrillation and CAD. Patient presented to [facility] on 6/29 with progressive dyspnea for the past 1 week. Upon arrival her sats went down to 83%. She was also noted to be in atrial fibrillation with RVR. Chest x-ray was concerning for pneumonia. In the ER she received steroids and was placed on BiPAP. Patient was admitted to the ICU and PCCM team was consulted for further management.   PAST MEDICAL HISTORY :  She  has a past medical history of COPD (chronic obstructive pulmonary disease) (HCC) (Jan 2014); A-fib [facility]); and Coronary artery disease.  PAST SURGICAL HISTORY: She  has past surgical history that includes Spine surgery (1999); Joint replacement (1994, 1996, 2000); Coronary stent placement (2007); Hip surgery; and Back surgery.  Allergies  Allergen Reactions  . Penicillins Rash    No current facility-administered medications on file prior to encounter.   Current Outpatient Prescriptions on File Prior to Encounter  Medication Sig  . albuterol (PROVENTIL) (2.5 MG/3ML) 0.083% nebulizer solution Take 2.5 mg by nebulization every 6 (six) hours as needed for wheezing.  . budesonide-formoterol (SYMBICORT) 160-4.5 MCG/ACT inhaler Inhale 2 puffs into the lungs 2 (two) times daily.   Marland Kitchen diltiazem (CARDIZEM CD) 180 MG 24 hr capsule Take 1 capsule (180 mg total) by mouth daily.  Marland Kitchen glucose blood (ACCU-CHEK AVIVA PLUS) test strip Check sugar 1 to 2 times a day. Dx: E11.9  . lisinopril (PRINIVIL,ZESTRIL) 40 MG tablet Take 1 tablet (40 mg  total) by mouth daily.  . magic mouthwash SOLN Take 5 mLs by mouth 3 (three) times daily as needed for mouth pain.  . metFORMIN (GLUCOPHAGE) 500 MG tablet Take 1 tablet (500 mg total) by mouth daily.  . metoprolol (LOPRESSOR) 100 MG tablet Take 1 tablet (100 mg total) by mouth 2 (two) times daily.  Marland Kitchen. ofloxacin (OCUFLOX) 0.3  % ophthalmic solution Place 5 drops into both ears as needed.  . OXYGEN-HELIUM IN Inhale 2 L into the lungs continuous. 2 liters of oxygen.  . potassium chloride SA (K-DUR,KLOR-CON) 20 MEQ tablet Take 20 mEq by mouth 2 (two) times daily.   Marland Kitchen. tiotropium (SPIRIVA) 18 MCG inhalation capsule Place 18 mcg into inhaler and inhale daily.     FAMILY HISTORY:  Her indicated that her mother is deceased. She indicated that her father is deceased.   SOCIAL HISTORY: She  reports that she has quit smoking. Her smoking use included Cigarettes. She has a 80 pack-year smoking history. She has never used smokeless tobacco. She reports that she does not drink alcohol or use illicit drugs.  REVIEW OF SYSTEMS:   Review of Systems  Constitutional: Positive for malaise/fatigue. Negative for fever, chills and weight loss.  HENT: Positive for congestion. Negative for ear discharge, ear pain and sore throat.   Eyes: Negative for double vision.  Respiratory: Positive for shortness of breath and wheezing. Negative for stridor.   Cardiovascular: Positive for palpitations. Negative for orthopnea, claudication and leg swelling.  Gastrointestinal: Negative for nausea, vomiting, diarrhea and constipation.  Genitourinary: Negative for urgency and frequency.  Musculoskeletal: Negative for back pain and neck pain.  Neurological: Positive for weakness. Negative for sensory change and speech change.  Endo/Heme/Allergies: Negative for environmental allergies. Does not bruise/bleed easily.  Psychiatric/Behavioral: Negative for hallucinations and substance abuse.     SUBJECTIVE:  Patient states that she has been short of breath but is feeling better now, lives at home.  She states that if needed she would be okay  To be put on mechanical ventilation . She would like to be Full code at this time.  VITAL SIGNS: BP 155/83 mmHg  Pulse 115  Temp(Src) 98.9 F (37.2 C) (Oral)  Resp 30  Ht 5\' 2"  (1.575 m)  Wt 157 lb 10.1 oz  (71.5 kg)  BMI 28.82 kg/m2  SpO2 92%  HEMODYNAMICS:    VENTILATOR SETTINGS: Vent Mode:  [-]  FiO2 (%):  [36 %] 36 %  INTAKE / OUTPUT:    PHYSICAL EXAMINATION: General:  Elderly Caucasian, white female, in some respiratory distress. Neuro:  Awake, alert, oriented, follows commands, no focal deficits. HEENT: atraumatic, normocephalic,PERRLA, no discharge  Cardiovascular: Irregular, S1 and S2, no MRG noted  Lungs:  expiratory wheezes throughout, no crackles, rhonchi noted Abdomen: soft, nontender, active bowel sounds Musculoskeletal:  no inflammation/deformity noted Skin:  wounds on left lower leg  LABS:  BMET  Recent Labs Lab 05/10/16 0700  NA 135  K 4.5  CL 96*  CO2 30  BUN 19  CREATININE 0.72  GLUCOSE 194*    Electrolytes  Recent Labs Lab 05/10/16 0700  CALCIUM 8.4*    CBC  Recent Labs Lab 05/10/16 0700  WBC 24.5*  HGB 12.9  HCT 38.5  PLT 285    Coag's  Recent Labs Lab 05/10/16 0700  APTT 45*  INR 4.25*    Sepsis Markers  Recent Labs Lab 05/10/16 0751  LATICACIDVEN 1.4    ABG No results for input(s): PHART, PCO2ART, PO2ART in  the last 168 hours.  Liver Enzymes No results for input(s): AST, ALT, ALKPHOS, BILITOT, ALBUMIN in the last 168 hours.  Cardiac Enzymes  Recent Labs Lab 05/10/16 0700  TROPONINI 0.03*    Glucose  Recent Labs Lab 05/10/16 1139  GLUCAP 182*    Imaging Dg Chest Portable 1 View  05/10/2016  CLINICAL DATA:  Dyspnea and shortness of breath. COPD. Productive cough. Ex-smoker. EXAM: PORTABLE CHEST 1 VIEW COMPARISON:  Chest radiographs dated 07/17/2015 and chest CT dated 07/19/2015. FINDINGS: Previously demonstrated patchy opacity in the left upper lobe is somewhat more confluent today. Per report, this was not seen on the most recent chest radiograph dated 09/14/2015, not available for direct comparison. Mild bibasilar atelectasis or scarring. Stable enlarged cardiac silhouette. Old, healed right eighth  rib fracture. IMPRESSION: 1. Recurrent left upper lobe pneumonia. 2. Mild bibasilar atelectasis or scarring. 3. Stable cardiomegaly. Electronically Signed   By: Beckie SaltsSteven  Reid M.D.   On: 05/10/2016 07:39     STUDIES:   None    Bincy Varughese,AG-ACNP Pulmonary & Critical Care [facility]   05/10/2016, 4:42 PM  Patient seen, examined, NP, agree with the assessment and plan. 70 year old female with history of diabetes, stage IV COPD now presenting with community-acquired pneumonia. On physical exam, the patient has decreased air entry in both lower lobes.  Wells Guiles-Deep Latif Nazareno M.D. 05/10/2016  Critical Care Attestation.  I have personally obtained a history, examined the patient, evaluated laboratory and imaging results, formulated the assessment and plan and placed orders. The Patient requires high complexity decision making for assessment and support, frequent evaluation and titration of therapies, application of advanced monitoring technologies and extensive interpretation of multiple databases. The patient has critical illness that could lead imminently to failure of 1 or more organ systems and requires the highest level of physician preparedness to intervene.  Critical Care Time devoted to patient care services described in this note is 45 minutes and is exclusive of time spent in procedures.

## 2016-05-10 NOTE — ED Notes (Signed)
Patient to ER for shortness of breath and difficulty breathing. Patient has known history of COPD, states she has been having trouble for a few days now, worse this am when trying to ambulate to kitchen. Upon  EMS arrival, patient's sats were 83% on RA. Patient took 3 Albuterol nebs at home prior to EMS arrival. Patient also coughing up purulent sputum. Denies any known fevers. Reports pain to "my lungs".

## 2016-05-10 NOTE — Progress Notes (Signed)
   05/10/16 1821  BiPAP/CPAP/SIPAP  BiPAP/CPAP/SIPAP Pt Type Adult  Mask Type Full face mask  Mask Size Large  Set Rate 10 breaths/min  IPAP 14 cmH20  EPAP 5 cmH2O  Oxygen Percent 36 %  Minute Ventilation 25.6  Leak 17  Peak Inspiratory Pressure (PIP) 15  Tidal Volume (Vt) 586  BiPAP/CPAP/SIPAP BiPAP  Press High Alarm 25 cmH2O  Press Low Alarm 2 cmH2O  BiPAP/CPAP /SiPAP Vitals  Pulse Rate 100  Resp (!) 24  SpO2 91 %  Placed patient  Back on bipap for increased work of breathing.  Patient tolerating bipap well.  RN is at bedside.

## 2016-05-10 NOTE — ED Notes (Signed)
Informed RN bed ready  1209

## 2016-05-10 NOTE — Progress Notes (Signed)
   05/10/16 1318  BiPAP/CPAP/SIPAP  BiPAP/CPAP/SIPAP Pt Type Adult  Mask Type Full face mask  Mask Size Medium  Set Rate 10 breaths/min  Respiratory Rate 10 breaths/min  IPAP 14 cmH20  EPAP 5 cmH2O  Oxygen Percent 36 %  Minute Ventilation 16.5  Leak 16  Peak Inspiratory Pressure (PIP) 16  Tidal Volume (Vt) 380  BiPAP/CPAP/SIPAP BiPAP  Press High Alarm 25 cmH2O  Press Low Alarm 2 cmH2O  BiPAP/CPAP /SiPAP Vitals  Pulse Rate (!) 129  Resp (!) 25  SpO2 94 %  Placed patient on Bipap due to increased WOB.  Patient is alert and oriented to time and place and admits to bipap alleviating any increased work of breathing.  RN is at bedside.  Patient tolerating well.

## 2016-05-10 NOTE — ED Provider Notes (Signed)
Assumed care from Dr. Manson PasseyBrown at 7:00 AM. Initial workup results show a clear left upper lobe pneumonia on chest x-ray along with leukocytosis to 24,000. Code sepsis initiated including 30 mL per KG IV saline bolus, blood cultures, lactate. Chest x-ray reports the left upper lobe pneumonia to be recurrent from about 7 months ago, clinical scenario consistent with community acquired pneumonia and not healthcare associated pneumonia.  Savannah CheekPhillip Knut Rondinelli, MD 05/10/16 [phone]

## 2016-05-10 NOTE — Progress Notes (Signed)
Pharmacy Antibiotic Note  [name] is a 70 y.o. female admitted on 05/10/2016 with pneumonia and sepsis.  Pharmacy has been consulted for Levaquin dosing.  Plan: Levaquin 500 mg iv q 24 hours.   Height: 5\' 2"  (157.5 cm) Weight: 155 lb (70.308 kg) IBW/kg (Calculated) : 50.1  Temp (24hrs), Avg:97.9 F (36.6 C), Min:97.9 F (36.6 C), Max:97.9 F (36.6 C)   Recent Labs Lab 05/10/16 0700 05/10/16 0751  WBC 24.5*  --   CREATININE 0.72  --   LATICACIDVEN  --  1.4    Estimated Creatinine Clearance: 59.3 mL/min (by C-G formula based on Cr of 0.72).    Allergies  Allergen Reactions  . Penicillins Rash    Antimicrobials this admission: Levaquin 6/29 >>   Dose adjustments this admission:  Microbiology results: 6/29 BCx: pending 6/29 UCx: pending  6/29 Sputum: pending   Thank you for allowing pharmacy to be a part of this patient's care.  Luisa HartChristy, Kilan Banfill D 05/10/2016 11:08 AM

## 2016-05-10 NOTE — ED Notes (Signed)
Called Code Sepsis

## 2016-05-10 NOTE — H&P (Addendum)
[facility] [place] [facility]   PATIENT NAME: [name]    MR#:  [id number]  DATE OF BIRTH:  02-24-1946  DATE OF ADMISSION:  05/10/2016  PRIMARY CARE PHYSICIAN: Fidel LevyJames Hawkins Jr, MD   REQUESTING/REFERRING PHYSICIAN: Dr Scotty CourtStafford   CHIEF COMPLAINT:   SOB and cough  HISTORY OF PRESENT ILLNESS:  Savannah Cavada Arganbright  is a 70 y.o. female with a known history of  Stage 4 COPD and Atrial FibrillationWho presents with progressive dyspnea for the past week. On arrival to the emergency room patient's oxygen saturation was 83%. She wears 3 L of oxygen at home. She was also found to have atrial fibrillation with RVR. Chest x-ray is consistent with pneumonia. She has received Levaquin and IV steroids. She is recently saw her pulmonologist and was prescribed prednisone taper which she currently is on. She was placed on BiPAP machine.  PAST MEDICAL HISTORY:   Past Medical History  Diagnosis Date  . COPD (chronic obstructive pulmonary disease) (HCC) Jan 2014  . A-fib (HCC)   . Coronary artery disease     PAST SURGICAL HISTORY:   Past Surgical History  Procedure Laterality Date  . Spine surgery  1999  . Joint replacement  1994, 1996, 2000    hip  . Coronary stent placement  2007  . Hip surgery    . Back surgery      SOCIAL HISTORY:   Social History  Substance Use Topics  . Smoking status: Former Smoker -- 2.00 packs/day for 40 years    Types: Cigarettes  . Smokeless tobacco: Never Used  . Alcohol Use: No    FAMILY HISTORY:   Family History  Problem Relation Age of Onset  . Heart failure Father   . Stroke Mother   . Cancer Sister [id number]    breast    DRUG ALLERGIES:   Allergies  Allergen Reactions  . Penicillins Rash    REVIEW OF SYSTEMS:   Review of Systems  Constitutional: Negative for fever, chills and malaise/fatigue.  HENT: Negative for ear discharge, ear pain, hearing loss, nosebleeds and sore throat.   Eyes: Negative for blurred vision and pain.   Respiratory: Positive for cough, shortness of breath and wheezing. Negative for hemoptysis.   Cardiovascular: Positive for palpitations. Negative for chest pain and leg swelling.  Gastrointestinal: Negative for nausea, vomiting, abdominal pain, diarrhea and blood in stool.  Genitourinary: Negative for dysuria.  Musculoskeletal: Negative for back pain.  Skin:       Shin ulcer  Neurological: Positive for weakness. Negative for dizziness, tremors, speech change, focal weakness, seizures and headaches.  Endo/Heme/Allergies: Does not bruise/bleed easily.  Psychiatric/Behavioral: Negative for depression, suicidal ideas and hallucinations.    MEDICATIONS AT HOME:   Prior to Admission medications   Medication Sig Start Date End Date Taking? Authorizing Provider  albuterol (PROVENTIL) (2.5 MG/3ML) 0.083% nebulizer solution Take 2.5 mg by nebulization every 6 (six) hours as needed for wheezing.   Yes Historical Provider, MD  budesonide-formoterol (SYMBICORT) 160-4.5 MCG/ACT inhaler Inhale 2 puffs into the lungs 2 (two) times daily.  12/13/14  Yes Historical Provider, MD  collagenase (SANTYL) ointment Apply 1 application topically daily.   Yes Historical Provider, MD  diltiazem (CARDIZEM CD) 180 MG 24 hr capsule Take 1 capsule (180 mg total) by mouth daily. 03/01/16  Yes Janeann ForehandJames H Hawkins Jr., MD  furosemide (LASIX) 20 MG tablet Take 20 mg by mouth daily.   Yes Historical Provider, MD  glucose blood (ACCU-CHEK AVIVA  PLUS) test strip Check sugar 1 to 2 times a day. Dx: E11.9 05/01/16  Yes Janeann ForehandJames H Hawkins Jr., MD  lisinopril (PRINIVIL,ZESTRIL) 40 MG tablet Take 1 tablet (40 mg total) by mouth daily. 09/02/15  Yes Janeann ForehandJames H Hawkins Jr., MD  magic mouthwash SOLN Take 5 mLs by mouth 3 (three) times daily as needed for mouth pain. 04/04/16  Yes Amy Rusty AusLauren Krebs, NP  metFORMIN (GLUCOPHAGE) 500 MG tablet Take 1 tablet (500 mg total) by mouth daily. 03/01/16  Yes Janeann ForehandJames H Hawkins Jr., MD  metoprolol (LOPRESSOR) 100 MG  tablet Take 1 tablet (100 mg total) by mouth 2 (two) times daily. 02/28/16  Yes Janeann ForehandJames H Hawkins Jr., MD  ofloxacin (OCUFLOX) 0.3 % ophthalmic solution Place 5 drops into both ears as needed. 09/08/15  Yes Historical Provider, MD  OXYGEN-HELIUM IN Inhale 2 L into the lungs continuous. 2 liters of oxygen.   Yes Historical Provider, MD  potassium chloride SA (K-DUR,KLOR-CON) 20 MEQ tablet Take 20 mEq by mouth 2 (two) times daily.  03/26/13  Yes Historical Provider, MD  pravastatin (PRAVACHOL) 80 MG tablet Take 80 mg by mouth daily.   Yes Historical Provider, MD  predniSONE (DELTASONE) 10 MG tablet Take 10-40 mg by mouth as directed. Take 40mg  daily for 2 days, 30mg  daily for 2 days, 20mg  daily for 2 days, 10mg  daily for 2 days 05/03/16  Yes Historical Provider, MD  tiotropium (SPIRIVA) 18 MCG inhalation capsule Place 18 mcg into inhaler and inhale daily.  12/21/13  Yes Historical Provider, MD  warfarin (COUMADIN) 3 MG tablet Take 3 mg by mouth See admin instructions. Take 3mg  daily on Tuesday, Wednesday, Friday, Saturday, Sunday   Yes Historical Provider, MD      VITAL SIGNS:  Blood pressure 162/109, pulse 129, temperature 97.9 F (36.6 C), temperature source Oral, resp. rate 35, height 5\' 2"  (1.575 m), weight 70.308 kg (155 lb), SpO2 94 %.  PHYSICAL EXAMINATION:   Physical Exam  Constitutional: She is oriented to person, place, and time and well-developed, well-nourished, and in no distress. No distress.  HENT:  Head: Normocephalic.  Eyes: No scleral icterus.  Neck: Normal range of motion. Neck supple. No JVD present. No tracheal deviation present. No thyromegaly present.  Cardiovascular: Exam reveals no gallop and no friction rub.   No murmur heard. Irregular, irregular with tachycardia  Pulmonary/Chest: No respiratory distress. She has wheezes. She has no rales. She exhibits no tenderness.  Tachypnea with increased respiratory effort  Abdominal: Soft. Bowel sounds are normal. She exhibits no  distension and no mass. There is no tenderness. There is no rebound and no guarding.  Musculoskeletal: Normal range of motion. She exhibits no edema.  Neurological: She is alert and oriented to person, place, and time.  Skin: Skin is warm. Rash noted. There is erythema.  Psychiatric: Affect and judgment normal.      LABORATORY PANEL:   CBC  Recent Labs Lab 05/10/16 0700  WBC 24.5*  HGB 12.9  HCT 38.5  PLT 285   ------------------------------------------------------------------------------------------------------------------  Chemistries   Recent Labs Lab 05/10/16 0700  NA 135  K 4.5  CL 96*  CO2 30  GLUCOSE 194*  BUN 19  CREATININE 0.72  CALCIUM 8.4*   ------------------------------------------------------------------------------------------------------------------  Cardiac Enzymes  Recent Labs Lab 05/10/16 0700  TROPONINI 0.03*   ------------------------------------------------------------------------------------------------------------------  RADIOLOGY:  Dg Chest Portable 1 View  05/10/2016  CLINICAL DATA:  Dyspnea and shortness of breath. COPD. Productive cough. Ex-smoker. EXAM: PORTABLE CHEST 1 VIEW COMPARISON:  Chest radiographs dated 07/17/2015 and chest CT dated 07/19/2015. FINDINGS: Previously demonstrated patchy opacity in the left upper lobe is somewhat more confluent today. Per report, this was not seen on the most recent chest radiograph dated 09/14/2015, not available for direct comparison. Mild bibasilar atelectasis or scarring. Stable enlarged cardiac silhouette. Old, healed right eighth rib fracture. IMPRESSION: 1. Recurrent left upper lobe pneumonia. 2. Mild bibasilar atelectasis or scarring. 3. Stable cardiomegaly. Electronically Signed   By: Beckie Salts M.D.   On: 05/10/2016 07:39    EKG:   Atrial fibrillation RVR no ST elevation  IMPRESSION AND PLAN:    70 year old female with a history of atrial fibrillation on anticoagulation and  stage IV COPD on 3 L oxygen who presents with sepsis due to community-acquired pneumonia.  1. Sepsis: Patient presents with leukocytosis and tachycardia. Sepsis is from community-acquired pneumonia. Continue antibiotics and IV fluids.  2. Acute on chronic hypoxic respiratory failure due to Community-acquired pneumonia and COPD exacerbation: Continue Levaquin as per pharmacy dosing. Follow up on blood cultures. Continue BiPAP. Consult pulmonary.  3. Atrial fibrillation with RVR: Start diltiazem drip and wean as tolerated. Continue oral medications. INR is elevated and therefore we'll hold Coumadin for now. Pharmacy will continue to assist with dosing.  4. Accelerated hypertension: Patient did not have medications this morning. Restart a.m. medications and diltiazem and follow blood pressure.  5. COPD exacerbation, mild: Start steroids, nebulizer treatments and continue oxygen.  6. Diabetes: Continue sliding scale insulin with ADA diet.  7. Shin ulcer: Wound care consult. She has been seeing wound care for the past 6 weeks as an outpatient.  All the records are reviewed and case discussed with ED provider. Management plans discussed with the patient and she in agreement  CODE STATUS: FULL  CRITICAL CARE TOTAL TIME TAKING CARE OF THIS PATIENT: 60 minutes.    Waylen Depaolo M.D on 05/10/2016 at 11:10 AM  Between 7am to 6pm - Pager - [phone]  After 6pm go to [email or web address] - Social research officer, government  [facility]  Office  [phone]  CC: Primary care physician; Fidel Levy, MD

## 2016-05-10 NOTE — Care Management (Signed)
Presents from home where she lives alone. PMH: Stage 4 COPD and afib. Admitted with shortness of breath and cough, positive for sepsis and PNA. IV steroids and IV antibiotics. WBC 24.5.  Chronic O2 @ 3L. Found to be in afib with RVR, Cardizem gtt started. PCP is Dr. Venora MaplesJames Hawkins. Will follow for discharge planning needs.

## 2016-05-10 NOTE — Progress Notes (Signed)
ANTICOAGULATION CONSULT NOTE - Initial Consult  Pharmacy Consult for Coumadin Indication: atrial fibrillation  Allergies  Allergen Reactions  . Penicillins Rash    Patient Measurements: Height: 5\' 2"  (157.5 cm) Weight: 155 lb (70.308 kg) IBW/kg (Calculated) : 50.1  Vital Signs: Temp: 97.9 F (36.6 C) (06/29 0655) Temp Source: Oral (06/29 0655) BP: 162/109 mmHg (06/29 1030) Pulse Rate: 129 (06/29 1030)  Labs:  Recent Labs  05/10/16 0700  HGB 12.9  HCT 38.5  PLT 285  APTT 45*  LABPROT 39.8*  INR 4.25*  CREATININE 0.72  TROPONINI 0.03*    Estimated Creatinine Clearance: 59.3 mL/min (by C-G formula based on Cr of 0.72).   Medical History: Past Medical History  Diagnosis Date  . COPD (chronic obstructive pulmonary disease) (HCC) Jan 2014  . A-fib (HCC)   . Coronary artery disease     Medications:   (Not in a hospital admission) Scheduled:  . albuterol  5 mg Nebulization Once  . ipratropium-albuterol  3 mL Nebulization Once   Infusions:  . diltiazem (CARDIZEM) infusion 5 mg/hr (05/10/16 1053)  . [START ON 05/11/2016] levofloxacin (LEVAQUIN) IV      Assessment: 70 y/o F with a h/o afib on Coumadin admitted with sepsis/PNA. Coumadin 3 mg T/W/F/Sa/Su PTA with supratherapeutic INR. Patient on abx.   Goal of Therapy:  INR 2-3   Plan :  Hold Coumadin and f/u AM INR.   Luisa Harthristy, Coyt Govoni D 05/10/2016,11:11 AM

## 2016-05-10 NOTE — Progress Notes (Signed)
Inpatient Diabetes Program Recommendations  AACE/ADA: New Consensus Statement on Inpatient Glycemic Control (2015)  Target Ranges:  Prepandial:   less than 140 mg/dL      Peak postprandial:   less than 180 mg/dL (1-2 hours)      Critically ill patients:  140 - 180 mg/dL   Lab Results  Component Value Date   GLUCAP 182* 05/10/2016   HGBA1C 6.8% 03/19/2016    Review of Glycemic Control  Results for [name], [name] (MRN [id number]) as of 05/10/2016 11:54  Ref. Range 05/10/2016 11:39  Glucose-Capillary Latest Ref Range: 65-99 mg/dL [id number] ([name])    Diabetes history: DM2 Outpatient Diabetes medications: Metformin 500 mg daily Current orders for Inpatient glycemic control: Novolog 0-20 units TID with meals, Novolog 0-5 units HS  Inpatient Diabetes Program Recommendations; Noted that patient received one dose of solu-medrol- no other steroids currently ordered.  I agree with Novolog 0-20 units today but if she is not going to receive additional steroids, consider decreasing the correction insulin to the sensitive correction scale starting tomorrow.   Will follow.   Susette RacerJulie Parthiv Mucci, RN, BA, MHA, CDE Diabetes Coordinator Inpatient Diabetes Program  [phone] (Team Pager) [phone] [facility] Office) 05/10/2016 12:04 PM

## 2016-05-11 LAB — CBC
HEMATOCRIT: 33.4 % — AB (ref 35.0–47.0)
HEMOGLOBIN: 11.1 g/dL — AB (ref 12.0–16.0)
MCH: 31.5 pg (ref 26.0–34.0)
MCHC: 33.3 g/dL (ref 32.0–36.0)
MCV: 94.5 fL (ref 80.0–100.0)
Platelets: 226 10*3/uL (ref 150–440)
RBC: 3.53 MIL/uL — ABNORMAL LOW (ref 3.80–5.20)
RDW: 14.7 % — AB (ref 11.5–14.5)
WBC: 15.8 10*3/uL — ABNORMAL HIGH (ref 3.6–11.0)

## 2016-05-11 LAB — GLUCOSE, CAPILLARY
GLUCOSE-CAPILLARY: 290 mg/dL — AB (ref 65–99)
Glucose-Capillary: 193 mg/dL — ABNORMAL HIGH (ref 65–99)
Glucose-Capillary: 321 mg/dL — ABNORMAL HIGH (ref 65–99)
Glucose-Capillary: 168 mg/dL — ABNORMAL HIGH (ref 65–99)

## 2016-05-11 LAB — PROTIME-INR
INR: 4.31
PROTHROMBIN TIME: 40.4 s — AB (ref 11.4–15.0)

## 2016-05-11 LAB — BASIC METABOLIC PANEL
ANION GAP: 5 (ref 5–15)
BUN: 21 mg/dL — AB (ref 6–20)
CALCIUM: 7.7 mg/dL — AB (ref 8.9–10.3)
CO2: 28 mmol/L (ref 22–32)
Chloride: 102 mmol/L (ref 101–111)
Creatinine, Ser: 0.71 mg/dL (ref 0.44–1.00)
GFR calc Af Amer: >60 mL/min (ref >60)
GFR calc non Af Amer: >60 mL/min (ref >60)
GLUCOSE: 226 mg/dL — AB (ref 65–99)
Potassium: 4.8 mmol/L (ref 3.5–5.1)
Sodium: 135 mmol/L (ref 135–145)

## 2016-05-11 LAB — URINE CULTURE

## 2016-05-11 MED ORDER — SODIUM CHLORIDE 0.9 % IV SOLN
1.0000 g | Freq: Three times a day (TID) | INTRAVENOUS | Status: DC
  Administered 2016-05-11 – 2016-05-13 (×6): 1 g via INTRAVENOUS
  Filled 2016-05-11 (×9): qty 1

## 2016-05-11 MED ORDER — INSULIN GLARGINE 100 UNIT/ML ~~LOC~~ SOLN
7.0000 [IU] | Freq: Every day | SUBCUTANEOUS | Status: DC
  Administered 2016-05-11 – 2016-05-15 (×5): 7 [IU] via SUBCUTANEOUS
  Filled 2016-05-11 (×8): qty 0.07

## 2016-05-11 MED ORDER — COLLAGENASE 250 UNIT/GM EX OINT
TOPICAL_OINTMENT | Freq: Every day | CUTANEOUS | Status: DC
  Administered 2016-05-11 – 2016-05-16 (×6): via TOPICAL
  Filled 2016-05-11: qty 30

## 2016-05-11 MED ORDER — DILTIAZEM HCL 30 MG PO TABS
30.0000 mg | ORAL_TABLET | Freq: Four times a day (QID) | ORAL | Status: DC
  Administered 2016-05-11 – 2016-05-12 (×4): 30 mg via ORAL
  Filled 2016-05-11 (×4): qty 1

## 2016-05-11 MED ORDER — BENZONATATE 100 MG PO CAPS
100.0000 mg | ORAL_CAPSULE | Freq: Two times a day (BID) | ORAL | Status: DC | PRN
  Administered 2016-05-11 – 2016-05-15 (×5): 100 mg via ORAL
  Filled 2016-05-11 (×6): qty 1

## 2016-05-11 NOTE — Progress Notes (Signed)
[facility] [place] Critical Care Medicine Progess Note    ASSESSMENT/PLAN     DISCUSSION:  70 year old female with history of diabetes, stage IV COPD now presenting with community-acquired pneumonia. Patient on BiPAP off-and-on  ASSESSMENT / PLAN:  PULMONARY A: Acute hypoxemic respiratory failure Community-acquired pneumonia-Left upper lobe  Stage IV COPD, on home O2 3 L, very poor functional status, poor prognosis.  P:  Continue BiPAP, the patient continues to require BiPAP during the day due to air hunger. Will increase antibiotic coverage given her lack of significant improvement. Methyl prednisone Continue DuoNeb Incentive spirometer/ flutter valve  CARDIOVASCULAR A:  A. fib with RVR History of hypertension History of hyperlipidemia  P:  Diltiazem gtt , taper as able, will start on Cardizem 30 mg by mouth every 6 to facilitate weaning off of the Cardizem infusion. Continue Lisinopril/metoprolol Continue pravastatin Continue lasix  RENAL A:  No active issues  P:  Replace electrolytes per ICU protocol  Chem-7 in am  GASTROINTESTINAL A:  No active issues  P:  Heart healthy diet   HEMATOLOGIC A:  Continues to have elevated INR  P:  Hold Coumadin for now  Pharmacy consulted for coumadin dosing Transfuse if Hgb <7   INFECTIOUS A:  Community-acquired pneumonia Leukocytosis, likely due to steroids. Ulcer on left shin P:  Monitor fever curve Antibiotics as above Followed by wound nurse   CULTURES: 6/29 sputum culture>> negative thus far 6/29 urine culture>> negative 629 blood culture>> negative thus far 6/29: MRSA PCR negative  ANTIBIOTICS: 6/29 levofloxacin>>6/30 Zosyn 6/30>>  ENDOCRINE A:  Diabetes mellitus, hyperglycemia due to steroids. P:  Blood sugar checks before meals at bedtime  *Lantus 7 units daily at bedtime today Continue Sliding scale insulin coverage   NEUROLOGIC A:  anxiety P:  RASS  goal: 0 Minimize sedating medications Xanax PRN   SIGNIFICANT EVENTS: 6/29 patient admitted to the ICU on BiPAP with sepsis related to community-acquired pneumonia.  LINES/TUBES: none ---------------------------------------   ----------------------------------------   Name: Christell Faithda H Malstrom MRN: [id number] DOB: 1946/05/29    ADMISSION DATE:  05/10/2016       SUBJECTIVE:   Continues to be short of breath, still appears anxious.  Review of Systems:  Constitutional: Feels well. Cardiovascular: No chest pain.  Pulmonary: Denies dyspnea.   The remainder of systems were reviewed and were found to be negative other than what is documented in the HPI.    VITAL SIGNS: Temp:  [97.1 F (36.2 C)-98.1 F (36.7 C)] 98.1 F (36.7 C) (06/30 1200) Pulse Rate:  [61-115] 83 (06/30 1200) Resp:  [14-33] 27 (06/30 1200) BP: (119-155)/(66-99) 142/79 mmHg (06/30 1200) SpO2:  [91 %-99 %] 96 % (06/30 1529) FiO2 (%):  [35 %-36 %] 35 % (06/30 1529) HEMODYNAMICS:   VENTILATOR SETTINGS: Vent Mode:  [-]  FiO2 (%):  [35 %-36 %] 35 % INTAKE / OUTPUT:  Intake/Output Summary (Last 24 hours) at 05/11/16 1609 Last data filed at 05/11/16 1217  Gross per 24 hour  Intake 5803.91 ml  Output    150 ml  Net 5653.91 ml    PHYSICAL EXAMINATION: Physical Examination:   VS: BP 142/79 mmHg  Pulse 83  Temp(Src) 98.1 F (36.7 C) (Axillary)  Resp 27  Ht 5\' 2"  (1.575 m)  Wt 157 lb 10.1 oz (71.5 kg)  BMI 28.82 kg/m2  SpO2 96%  General Appearance: No distress  Neuro:without focal findings, mental status Anxious HEENT: PERRLA, EOM intact. Pulmonary: Bilateral expiratory wheezing, decreased air entry bilaterally. CardiovascularNormal S1,S2.  No  m/r/g.   Abdomen: Benign, Soft, non-tender. Renal:  No costovertebral tenderness  GU:  Not performed at this time. Endocrine: No evident thyromegaly. Skin:   warm, no rashes, no ecchymosis  Extremities: normal, no cyanosis,  clubbing.   LABS:   LABORATORY PANEL:   CBC  Recent Labs Lab 05/11/16 0507  WBC 15.8*  HGB 11.1*  HCT 33.4*  PLT 226    Chemistries   Recent Labs Lab 05/11/16 0507  NA 135  K 4.8  CL 102  CO2 28  GLUCOSE 226*  BUN 21*  CREATININE 0.71  CALCIUM 7.7*     Recent Labs Lab 05/10/16 1139 05/10/16 1733 05/10/16 2119 05/11/16 0707 05/11/16 1121  GLUCAP 182* 274* 264* 193* 321*   No results for input(s): PHART, PCO2ART, PO2ART in the last 168 hours. No results for input(s): AST, ALT, ALKPHOS, BILITOT, ALBUMIN in the last 168 hours.  Cardiac Enzymes  Recent Labs Lab 05/10/16 0700  TROPONINI 0.03*    RADIOLOGY:  Dg Chest Portable 1 View  05/10/2016  CLINICAL DATA:  Dyspnea and shortness of breath. COPD. Productive cough. Ex-smoker. EXAM: PORTABLE CHEST 1 VIEW COMPARISON:  Chest radiographs dated 07/17/2015 and chest CT dated 07/19/2015. FINDINGS: Previously demonstrated patchy opacity in the left upper lobe is somewhat more confluent today. Per report, this was not seen on the most recent chest radiograph dated 09/14/2015, not available for direct comparison. Mild bibasilar atelectasis or scarring. Stable enlarged cardiac silhouette. Old, healed right eighth rib fracture. IMPRESSION: 1. Recurrent left upper lobe pneumonia. 2. Mild bibasilar atelectasis or scarring. 3. Stable cardiomegaly. Electronically Signed   By: Beckie SaltsSteven  Reid M.D.   On: 05/10/2016 07:39       --Wells Guileseep Brice Potteiger, MD.  ICU Pager: [phone] [facility] Office Number: [phone]  Santiago Gladavid Kasa, M.D.  Stephanie AcreVishal Mungal, M.D.  Billy Fischeravid Simonds, M.D  05/11/2016   Critical Care Attestation.  I have personally obtained a history, examined the patient, evaluated laboratory and imaging results, formulated the assessment and plan and placed orders. The Patient requires high complexity decision making for assessment and support, frequent evaluation and titration of therapies,  application of advanced monitoring technologies and extensive interpretation of multiple databases. The patient has critical illness that could lead imminently to failure of 1 or more organ systems and requires the highest level of physician preparedness to intervene.  Critical Care Time devoted to patient care services described in this note is 45 minutes and is exclusive of time spent in procedures.

## 2016-05-11 NOTE — Progress Notes (Signed)
ANTICOAGULATION CONSULT NOTE - Initial Consult  Pharmacy Consult for Coumadin Indication: atrial fibrillation  Allergies  Allergen Reactions  . Penicillins Rash    Patient Measurements: Height: 5\' 2"  (157.5 cm) Weight: 157 lb 10.1 oz (71.5 kg) IBW/kg (Calculated) : 50.1  Vital Signs: Temp: 98 F (36.7 C) (06/30 0800) Temp Source: Oral (06/30 0800) BP: 130/71 mmHg (06/30 0800) Pulse Rate: 61 (06/30 0800)  Labs:  Recent Labs  05/10/16 0700 05/11/16 0507  HGB 12.9 11.1*  HCT 38.5 33.4*  PLT 285 226  APTT 45*  --   LABPROT 39.8* 40.4*  INR 4.25* 4.31*  CREATININE 0.72 0.71  TROPONINI 0.03*  --     Estimated Creatinine Clearance: 59.8 mL/min (by C-G formula based on Cr of 0.71).   Medical History: Past Medical History  Diagnosis Date  . COPD (chronic obstructive pulmonary disease) (HCC) Jan 2014  . A-fib (HCC)   . Coronary artery disease     Medications:  Prescriptions prior to admission  Medication Sig Dispense Refill Last Dose  . albuterol (PROVENTIL) (2.5 MG/3ML) 0.083% nebulizer solution Take 2.5 mg by nebulization every 6 (six) hours as needed for wheezing.   prn at prn  . budesonide-formoterol (SYMBICORT) 160-4.5 MCG/ACT inhaler Inhale 2 puffs into the lungs 2 (two) times daily.    05/09/2016 at Unknown time  . collagenase (SANTYL) ointment Apply 1 application topically daily.   05/09/2016 at Unknown time  . diltiazem (CARDIZEM CD) 180 MG 24 hr capsule Take 1 capsule (180 mg total) by mouth daily. 90 capsule 3 05/09/2016 at Unknown time  . furosemide (LASIX) 20 MG tablet Take 20 mg by mouth daily.   05/09/2016 at Unknown time  . glucose blood (ACCU-CHEK AVIVA PLUS) test strip Check sugar 1 to 2 times a day. Dx: E11.9 100 each 11   . lisinopril (PRINIVIL,ZESTRIL) 40 MG tablet Take 1 tablet (40 mg total) by mouth daily. 90 tablet 3 05/09/2016 at Unknown time  . magic mouthwash SOLN Take 5 mLs by mouth 3 (three) times daily as needed for mouth pain. 100 mL 0 prn at  prn  . metFORMIN (GLUCOPHAGE) 500 MG tablet Take 1 tablet (500 mg total) by mouth daily. 90 tablet 3 05/09/2016 at Unknown time  . metoprolol (LOPRESSOR) 100 MG tablet Take 1 tablet (100 mg total) by mouth 2 (two) times daily. 180 tablet 3 05/09/2016 at Unknown time  . ofloxacin (OCUFLOX) 0.3 % ophthalmic solution Place 5 drops into both ears as needed.  0 prn at prn  . OXYGEN-HELIUM IN Inhale 2 L into the lungs continuous. 2 liters of oxygen.   Taking  . potassium chloride SA (K-DUR,KLOR-CON) 20 MEQ tablet Take 20 mEq by mouth 2 (two) times daily.    05/09/2016 at Unknown time  . pravastatin (PRAVACHOL) 80 MG tablet Take 80 mg by mouth daily.   05/09/2016 at Unknown time  . predniSONE (DELTASONE) 10 MG tablet Take 10-40 mg by mouth as directed. Take 40mg  daily for 2 days, 30mg  daily for 2 days, 20mg  daily for 2 days, 10mg  daily for 2 days  0 05/09/2016 at Unknown time  . tiotropium (SPIRIVA) 18 MCG inhalation capsule Place 18 mcg into inhaler and inhale daily.    05/09/2016 at Unknown time  . warfarin (COUMADIN) 3 MG tablet Take 3 mg by mouth See admin instructions. Take 3mg  daily on Tuesday, Wednesday, Friday, Saturday, Sunday   05/09/2016 at Unknown time   Scheduled:  . antiseptic oral rinse  7 mL Mouth  Rinse q12n4p  . chlorhexidine  15 mL Mouth Rinse BID  . collagenase   Topical Daily  . docusate sodium  100 mg Oral BID  . furosemide  20 mg Oral Daily  . insulin aspart  0-20 Units Subcutaneous TID WC  . insulin aspart  0-5 Units Subcutaneous QHS  . ipratropium-albuterol  3 mL Nebulization Q4H  . levofloxacin (LEVAQUIN) IV  500 mg Intravenous Q24H  . lisinopril  40 mg Oral Daily  . methylPREDNISolone (SOLU-MEDROL) injection  60 mg Intravenous Q8H  . metoprolol  100 mg Oral BID  . mometasone-formoterol  2 puff Inhalation BID  . potassium chloride SA  20 mEq Oral BID  . pravastatin  80 mg Oral Daily  . tiotropium  18 mcg Inhalation Daily  . Warfarin - Pharmacist Dosing Inpatient   Does not  apply q1800   Infusions:  . sodium chloride 75 mL/hr at 05/11/16 0600  . diltiazem (CARDIZEM) infusion 5 mg/hr (05/11/16 0600)    Assessment:  Pharmacy consulted for warfarin dosing for 70 yo female with history of atrial fibrillation, goal INR 2-3. Patient is on antibiotics for CAP.   Goal of Therapy:  INR 2-3   Plan :  INR remains elevated. Will continue to hold warfarin and obtain INR with am labs.   Pharmacy will continue to monitor and adjust per consult.    Kymir Coles L 05/11/2016,9:16 AM

## 2016-05-11 NOTE — Progress Notes (Signed)
[facility] [place] [facility]   PATIENT NAME: [name]    MR#:  [id number]  DATE OF BIRTH:  [birth year]  SUBJECTIVE:  Trouble breathing this am but some improvement since yesterday  Still with cough, wheezing, shortness of breath and occasions.  REVIEW OF SYSTEMS:    Review of Systems  Constitutional: Positive for malaise/fatigue. Negative for fever and chills.  HENT: Negative for ear discharge, ear pain, hearing loss, nosebleeds and sore throat.   Eyes: Negative for blurred vision and pain.  Respiratory: Positive for cough, shortness of breath and wheezing. Negative for hemoptysis.   Cardiovascular: Positive for palpitations. Negative for chest pain and leg swelling.  Gastrointestinal: Negative for nausea, vomiting, abdominal pain, diarrhea and blood in stool.  Genitourinary: Negative for dysuria.  Musculoskeletal: Negative for back pain.  Neurological: Positive for weakness. Negative for dizziness, tremors, speech change, focal weakness, seizures and headaches.  Endo/Heme/Allergies: Does not bruise/bleed easily.  Psychiatric/Behavioral: Negative for depression, suicidal ideas and hallucinations.    Tolerating Diet:yes      DRUG ALLERGIES:   Allergies  Allergen Reactions  . Penicillins Rash    VITALS:  Blood pressure 130/71, pulse 61, temperature 98 F (36.7 C), temperature source Oral, resp. rate 23, height 5\' 2"  (1.575 m), weight 71.5 kg (157 lb 10.1 oz), SpO2 97 %.  PHYSICAL EXAMINATION:   Physical Exam  Constitutional: She is oriented to person, place, and time. She appears distressed.  Increased work of breathing  HENT:  Head: Normocephalic.  Eyes: No scleral icterus.  Neck: Normal range of motion. Neck supple. No JVD present. No tracheal deviation present.  Cardiovascular: Normal rate, regular rhythm and normal heart sounds.  Exam reveals no gallop and no friction rub.   No murmur heard. Tachycardia with irregular irregular heartbeat   Pulmonary/Chest: No respiratory distress. She has wheezes. She has no rales. She exhibits no tenderness.  Wheezing bilaterally lungs are tight  Abdominal: Soft. Bowel sounds are normal. She exhibits no distension and no mass. There is no tenderness. There is no rebound and no guarding.  Musculoskeletal: Normal range of motion. She exhibits no edema.  Neurological: She is alert and oriented to person, place, and time.  Skin: Skin is warm. No rash noted. No erythema.  Psychiatric: Affect and judgment normal.      LABORATORY PANEL:   CBC  Recent Labs Lab 05/11/16 0507  WBC 15.8*  HGB 11.1*  HCT 33.4*  PLT 226   ------------------------------------------------------------------------------------------------------------------  Chemistries   Recent Labs Lab 05/11/16 0507  NA 135  K 4.8  CL 102  CO2 28  GLUCOSE 226*  BUN 21*  CREATININE 0.71  CALCIUM 7.7*   ------------------------------------------------------------------------------------------------------------------  Cardiac Enzymes  Recent Labs Lab 05/10/16 0700  TROPONINI 0.03*   ------------------------------------------------------------------------------------------------------------------  RADIOLOGY:  Dg Chest Portable 1 View  05/10/2016  CLINICAL DATA:  Dyspnea and shortness of breath. COPD. Productive cough. Ex-smoker. EXAM: PORTABLE CHEST 1 VIEW COMPARISON:  Chest radiographs dated 07/17/2015 and chest CT dated 07/19/2015. FINDINGS: Previously demonstrated patchy opacity in the left upper lobe is somewhat more confluent today. Per report, this was not seen on the most recent chest radiograph dated 09/14/2015, not available for direct comparison. Mild bibasilar atelectasis or scarring. Stable enlarged cardiac silhouette. Old, healed right eighth rib fracture. IMPRESSION: 1. Recurrent left upper lobe pneumonia. 2. Mild bibasilar atelectasis or scarring. 3. Stable cardiomegaly. Electronically Signed   By:  Beckie SaltsSteven  Reid M.D.   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  1. Sepsis: Patient presentedwith leukocytosis and tachycardia. Sepsis is from community-acquired pneumonia. Continue antibiotics and IV fluids.  2. Acute on chronic hypoxic respiratory failure due to Community-acquired pneumonia and COPD exacerbation:  Continue Levaquin as per pharmacy dosing.  Blood cultures are negative to date Continue BiPAP at night and oxygen during the day.   3. Atrial fibrillation with RVR: Continue IV diltiazem with parameters. Oral diltiazem and metoprolol ordered in order to wean diltiazem off INR supratherapeutic. Pharmacy following   4. Accelerated hypertension: Continue diltiazem and lisinopril, metoprolol 5. COPD exacerbation, mild: Start steroids, nebulizer treatments and continue oxygen.  6. Diabetes: Continue sliding scale insulin with ADA diet.  7. Shin ulcer: Cleanse wound to left lower leg with NS and pat gently dry. Santyl ointment to wound bed. Cover with NS moist gauze, 4x4 gauze and kerlix. Change daily      Management plans discussed with the patient and she is in agreement.  CODE STATUS: full she is possibly thinking DO NOT RESUSCITATE but has not made this decision yet  CRITICAL CARETOTAL TIME TAKING CARE OF THIS PATIENT: 36 minutes.   High risk cardiacpulmonaryarrest.   POSSIBLE D/C >>, DEPENDING ON CLINICAL CONDITION.   Kartel Wolbert M.D on 05/11/2016 at 12:10 PM  Between 7am to 6pm - Pager - [phone] After 6pm go to [email or web address] - password [employer] [facility]  [facility] [place] [facility]  Office  [phone]  CC: Primary care physician; Fidel LevyJames Hawkins Jr, MD  Note: This dictation was prepared with Dragon dictation along with smaller phrase technology. Any transcriptional errors that  result from this process are unintentional.

## 2016-05-11 NOTE — Progress Notes (Signed)
ANTIBIOTIC CONSULT NOTE - INITIAL  Pharmacy Consult for Meropenem Indication: pneumonia  Allergies  Allergen Reactions  . Penicillins Rash    Patient Measurements: Height: 5\' 2"  (157.5 cm) Weight: 157 lb 10.1 oz (71.5 kg) IBW/kg (Calculated) : 50.1 Adjusted Body Weight:   Vital Signs: Temp: 98.1 F (36.7 C) (06/30 1200) Temp Source: Axillary (06/30 1200) BP: 134/85 mmHg (06/30 1600) Pulse Rate: 95 (06/30 1600) Intake/Output from previous day: 06/29 0701 - 06/30 0700 In: 5232.7 [I.V.:5232.7] Out: 150 [Urine:150] Intake/Output from this shift: Total I/O In: 571.3 [I.V.:471.3; IV Piggyback:100] Out: -   Labs:  Recent Labs  05/10/16 0700 05/11/16 0507  WBC 24.5* 15.8*  HGB 12.9 11.1*  PLT 285 226  CREATININE 0.72 0.71   Estimated Creatinine Clearance: 59.8 mL/min (by C-G formula based on Cr of 0.71). No results for input(s): VANCOTROUGH, VANCOPEAK, VANCORANDOM, GENTTROUGH, GENTPEAK, GENTRANDOM, TOBRATROUGH, TOBRAPEAK, TOBRARND, AMIKACINPEAK, AMIKACINTROU, AMIKACIN in the last 72 hours.   Microbiology: Recent Results (from the past 720 hour(s))  Blood culture (routine x 2)     Status: None (Preliminary result)   Collection Time: 05/10/16  7:21 AM  Result Value Ref Range Status   Specimen Description BLOOD AJ  Final   Special Requests   Final    BOTTLES DRAWN AEROBIC AND ANAEROBIC AER 11ML ANA 12ML   Culture NO GROWTH 1 DAY  Final   Report Status PENDING  Incomplete  Blood culture (routine x 2)     Status: None (Preliminary result)   Collection Time: 05/10/16  7:21 AM  Result Value Ref Range Status   Specimen Description BLOOD RIGHT AC  Final   Special Requests BOTTLES DRAWN AEROBIC AND ANAEROBIC 12ML  Final   Culture NO GROWTH 1 DAY  Final   Report Status PENDING  Incomplete  Culture, sputum-assessment     Status: None   Collection Time: 05/10/16  7:51 AM  Result Value Ref Range Status   Specimen Description EXPECTORATED SPUTUM  Final   Special Requests  NONE  Final   Sputum evaluation THIS SPECIMEN IS ACCEPTABLE FOR SPUTUM CULTURE  Final   Report Status 05/10/2016 FINAL  Final  Urine culture     Status: Abnormal   Collection Time: 05/10/16  7:51 AM  Result Value Ref Range Status   Specimen Description URINE, RANDOM  Final   Special Requests NONE  Final   Culture MULTIPLE SPECIES PRESENT, SUGGEST RECOLLECTION (A)  Final   Report Status 05/11/2016 FINAL  Final  Culture, respiratory (NON-Expectorated)     Status: None (Preliminary result)   Collection Time: 05/10/16  7:51 AM  Result Value Ref Range Status   Specimen Description EXPECTORATED SPUTUM  Final   Special Requests NONE Reflexed from [id number]  Final   Gram Stain   Final    ABUNDANT WBC PRESENT,BOTH PMN AND MONONUCLEAR ABUNDANT GRAM POSITIVE COCCI IN PAIRS IN CLUSTERS ABUNDANT GRAM VARIABLE ROD    Culture   Final    CULTURE REINCUBATED FOR BETTER GROWTH [facility]    Report Status PENDING  Incomplete  MRSA PCR Screening     Status: None   Collection Time: 05/10/16  2:00 PM  Result Value Ref Range Status   MRSA by PCR NEGATIVE NEGATIVE Final    Comment:        The GeneXpert MRSA Assay (FDA approved for NASAL specimens only), is one component of a comprehensive MRSA colonization surveillance program. It is not intended to diagnose MRSA infection nor to guide or  monitor treatment for MRSA infections.     Medical History: Past Medical History  Diagnosis Date  . COPD (chronic obstructive pulmonary disease) (HCC) Jan 2014  . A-fib (HCC)   . Coronary artery disease     Medications:  Scheduled:  . antiseptic oral rinse  7 mL Mouth Rinse q12n4p  . chlorhexidine  15 mL Mouth Rinse BID  . collagenase   Topical Daily  . diltiazem  30 mg Oral Q6H  . docusate sodium  100 mg Oral BID  . furosemide  20 mg Oral Daily  . insulin aspart  0-20 Units Subcutaneous TID WC  . insulin aspart  0-5 Units Subcutaneous QHS  . insulin glargine  7 Units  Subcutaneous QHS  . ipratropium-albuterol  3 mL Nebulization Q4H  . lisinopril  40 mg Oral Daily  . meropenem (MERREM) IV  1 g Intravenous Q8H  . methylPREDNISolone (SOLU-MEDROL) injection  60 mg Intravenous Q8H  . metoprolol  100 mg Oral BID  . mometasone-formoterol  2 puff Inhalation BID  . potassium chloride SA  20 mEq Oral BID  . pravastatin  80 mg Oral Daily  . tiotropium  18 mcg Inhalation Daily  . Warfarin - Pharmacist Dosing Inpatient   Does not apply q1800   Assessment: Pharmacy consulted to dose meropenem in this 70 year old female with PNA, gram variable rods in sputum. Pt has rash to PCN.   Goal of Therapy:  resolution of infection  Plan:  Expected duration 7 days with resolution of temperature and/or normalization of WBC   Meropenem 1 gm IV Q8H ordered to start on 6/30.     Delvon Chipps D 05/11/2016,4:31 PM

## 2016-05-11 NOTE — Progress Notes (Addendum)
Inpatient Diabetes Program Recommendations  AACE/ADA: New Consensus Statement on Inpatient Glycemic Control (2015)  Target Ranges:  Prepandial:   less than 140 mg/dL      Peak postprandial:   less than 180 mg/dL (1-2 hours)      Critically ill patients:  140 - 180 mg/dL   Lab Results  Component Value Date   GLUCAP 193* 05/11/2016   HGBA1C 6.8% 03/19/2016    Review of Glycemic Control  Results for [name], Melea H (MRN [id number]) as of 05/11/2016 11:31  Ref. Range 05/10/2016 11:39 05/10/2016 17:33 05/10/2016 21:19 05/11/2016 07:07 05/11/2016 11:21  Glucose-Capillary Latest Ref Range: 65-99 mg/dL 409182 (H) 811274 (H) 914264 (H) 193 (H) 321 (H)   Diabetes history: DM2 Outpatient Diabetes medications: Metformin 500 mg daily Current orders for Inpatient glycemic control: Novolog 0-20 units TID with meals, Novolog 0-5 units HS * steroids  Inpatient Diabetes Program Recommendations; Elevated blood sugars despite Novolog correction insulin- consider a low dose basal insulin, Lantus 7 units qday (0.1unit/kg) starting now. Decrease insulin as steroids are tapered.  Please change diet to heart healthy/carb modified  Susette RacerJulie Laine Giovanetti, RN, [place], [place]HA, CDE Diabetes Coordinator Inpatient Diabetes Program  [phone] (Team Pager) [phone] [facility] Office) 05/11/2016 8:51 AM

## 2016-05-11 NOTE — Progress Notes (Signed)
Pharmacy Antibiotic Note  [name] is a 70 y.o. female admitted on 05/10/2016 with pneumonia and sepsis.  Pharmacy has been consulted for levofloxacin dosing.  Plan: Continue levofloxacin 500mg  IV Q24hr.    Height: 5\' 2"  (157.5 cm) Weight: 157 lb 10.1 oz (71.5 kg) IBW/kg (Calculated) : 50.1  Temp (24hrs), Avg:97.7 F (36.5 C), Min:97.1 F (36.2 C), Max:98.1 F (36.7 C)   Recent Labs Lab 05/10/16 0700 05/10/16 0751 05/11/16 0507  WBC 24.5*  --  15.8*  CREATININE 0.72  --  0.71  LATICACIDVEN  --  1.4  --     Estimated Creatinine Clearance: 59.8 mL/min (by C-G formula based on Cr of 0.71).    Allergies  Allergen Reactions  . Penicillins Rash    Antimicrobials this admission: levofloxacin 6/29 >>   Dose adjustments this admission:  Microbiology results: 6/29 BCx x 2: no growth x 1 day 6/29 UCx: contaminant  6/29 Sputum: pending  6/29 MRSA PCR: negative   Pharmacy will continue to monitor and adjust per consult.    Simpson,Michael L 05/11/2016 4:07 PM

## 2016-05-11 NOTE — Consult Note (Signed)
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 [facility]   Wound type:Nonhealing chronic  Pressure Ulcer POA: N/A Measurement:4 cm x 3 cm unable to visualize wound bed due to the presence of devitalized tissue.  WIll begin enzymatic debridement.   Wound EAV:WUJWJbed:Ruddy red and 50% adherent slough Drainage (amount, consistency, odor) Moderate serosnaguinous.  Musty odor.  Periwound:Chronic skin changes.  Dressing procedure/placement/frequency:Cleanse wound to left lower leg with NS and pat gently dry.  Santyl ointment to wound bed.  Cover with NS moist gauze, 4x4 gauze and kerlix.  Change daily.  Will not follow at this time.  Please re-consult if needed.  Maple HudsonKaren Chelsia Serres RN BSN CWON Pager [phone]

## 2016-05-11 NOTE — Progress Notes (Addendum)
Advance Care Planning Discussion.   A: Had an EOL discussion with patient, explained that should she be placed on ventilator, she may never come off and I think there is a high likelihood that she would require a tracheostomy and care in a long term facility. She is getting to the point that she can not live independently for much longer, she rarely leaves the home, and requires assistance with many basic activities due to dyspnea.   P: Pt expressed the desire not to be placed on life support. We will continue current therapies but should her breathing worsen to the point that she requires a ventilator or her heart were to stop, we will transition to keeping her comfortable and allow her to pass naturally.   [name], M.D.  05/11/2016 (discussion time 20 min).

## 2016-05-12 ENCOUNTER — Inpatient Hospital Stay: Payer: Medicare Other

## 2016-05-12 LAB — CBC
HEMATOCRIT: 33.8 % — AB (ref 35.0–47.0)
HEMOGLOBIN: 11.2 g/dL — AB (ref 12.0–16.0)
MCH: 31.7 pg (ref 26.0–34.0)
MCHC: 33.3 g/dL (ref 32.0–36.0)
MCV: 95.4 fL (ref 80.0–100.0)
Platelets: 245 10*3/uL (ref 150–440)
RBC: 3.54 MIL/uL — AB (ref 3.80–5.20)
RDW: 15 % — ABNORMAL HIGH (ref 11.5–14.5)
WBC: 20.5 10*3/uL — AB (ref 3.6–11.0)

## 2016-05-12 LAB — BASIC METABOLIC PANEL
ANION GAP: 5 (ref 5–15)
BUN: 32 mg/dL — ABNORMAL HIGH (ref 6–20)
CHLORIDE: 104 mmol/L (ref 101–111)
CO2: 28 mmol/L (ref 22–32)
Calcium: 7.8 mg/dL — ABNORMAL LOW (ref 8.9–10.3)
Creatinine, Ser: 0.82 mg/dL (ref 0.44–1.00)
GFR calc non Af Amer: >60 mL/min (ref >60)
Glucose, Bld: 196 mg/dL — ABNORMAL HIGH (ref 65–99)
POTASSIUM: 4.8 mmol/L (ref 3.5–5.1)
SODIUM: 137 mmol/L (ref 135–145)

## 2016-05-12 LAB — GLUCOSE, CAPILLARY
GLUCOSE-CAPILLARY: 194 mg/dL — AB (ref 65–99)
GLUCOSE-CAPILLARY: 300 mg/dL — AB (ref 65–99)
GLUCOSE-CAPILLARY: 219 mg/dL — AB (ref 65–99)
Glucose-Capillary: 209 mg/dL — ABNORMAL HIGH (ref 65–99)

## 2016-05-12 LAB — PROTIME-INR
INR: 3.4
PROTHROMBIN TIME: 33.6 s — AB (ref 11.4–15.0)

## 2016-05-12 MED ORDER — METOPROLOL TARTRATE 50 MG PO TABS: 50.0000 mg | ORAL_TABLET | Freq: Two times a day (BID) | ORAL | Status: DC

## 2016-05-12 MED ORDER — DILTIAZEM HCL 30 MG PO TABS
30.0000 mg | ORAL_TABLET | Freq: Four times a day (QID) | ORAL | Status: DC | PRN
  Filled 2016-05-12: qty 1

## 2016-05-12 MED ORDER — DILTIAZEM HCL 60 MG PO TABS: 60.0000 mg | ORAL_TABLET | Freq: Four times a day (QID) | ORAL | Status: DC

## 2016-05-12 MED ORDER — MAGNESIUM HYDROXIDE 400 MG/5ML PO SUSP
30.0000 mL | Freq: Every evening | ORAL | Status: DC | PRN
  Administered 2016-05-12: 30 mL via ORAL
  Filled 2016-05-12: qty 30

## 2016-05-12 MED ORDER — METOPROLOL TARTRATE 5 MG/5ML IV SOLN
2.5000 mg | INTRAVENOUS | Status: DC | PRN
  Administered 2016-05-12 – 2016-05-15 (×7): 5 mg via INTRAVENOUS
  Filled 2016-05-12 (×7): qty 5

## 2016-05-12 MED ORDER — ALUM & MAG HYDROXIDE-SIMETH 200-200-20 MG/5ML PO SUSP
30.0000 mL | ORAL | Status: DC | PRN
  Administered 2016-05-12 – 2016-05-14 (×6): 30 mL via ORAL
  Filled 2016-05-12 (×6): qty 30

## 2016-05-12 MED ORDER — GUAIFENESIN-CODEINE 100-10 MG/5ML PO SOLN
5.0000 mL | ORAL | Status: DC | PRN
  Administered 2016-05-12 – 2016-05-14 (×4): 5 mL via ORAL
  Filled 2016-05-12 (×4): qty 5

## 2016-05-12 NOTE — Progress Notes (Signed)
Hypertensive.  Informed M.Luci Bankukov, NP.  Metoprolol 100mg  scheduled for 2200.  To give and then monitor.  PRN IV metoprolol already ordered.

## 2016-05-12 NOTE — Progress Notes (Signed)
[facility] [place] [facility]   PATIENT NAME: [name]    MR#:  [id number]  DATE OF BIRTH:  [birth year]  SUBJECTIVE:   Presently on nasal cannula. Wears 3 L oxygen at home. Shortness of breath is improved today but still present. Dry cough. No chest pain. Afebrile.  REVIEW OF SYSTEMS:    Review of Systems  Constitutional: Positive for malaise/fatigue. Negative for fever and chills.  HENT: Negative for ear discharge, ear pain, hearing loss, nosebleeds and sore throat.   Eyes: Negative for blurred vision and pain.  Respiratory: Positive for cough, shortness of breath and wheezing. Negative for hemoptysis.   Cardiovascular: Positive for palpitations. Negative for chest pain and leg swelling.  Gastrointestinal: Negative for nausea, vomiting, abdominal pain, diarrhea and blood in stool.  Genitourinary: Negative for dysuria.  Musculoskeletal: Negative for back pain.  Neurological: Positive for weakness. Negative for dizziness, tremors, speech change, focal weakness, seizures and headaches.  Endo/Heme/Allergies: Does not bruise/bleed easily.  Psychiatric/Behavioral: Negative for depression, suicidal ideas and hallucinations.   DRUG ALLERGIES:   Allergies  Allergen Reactions  . Penicillins Rash   VITALS:  Blood pressure 114/101, pulse 110, temperature 97.7 F (36.5 C), temperature source Axillary, resp. rate 35, height 5\' 2"  (1.575 m), weight 71.5 kg (157 lb 10.1 oz), SpO2 90 %.  PHYSICAL EXAMINATION:   Physical Exam  Constitutional: She is oriented to person, place, and time. She appears distressed.  Increased work of breathing  HENT:  Head: Normocephalic.  Eyes: No scleral icterus.  Neck: Normal range of motion. Neck supple. No JVD present. No tracheal deviation present.  Cardiovascular: Normal rate, regular rhythm and normal heart sounds.  Exam reveals no gallop and no friction rub.   No murmur heard. Tachycardia with irregular irregular heartbeat   Pulmonary/Chest: No respiratory distress. She has wheezes. She has no rales. She exhibits no tenderness.  Wheezing bilaterally lungs are tight  Abdominal: Soft. Bowel sounds are normal. She exhibits no distension and no mass. There is no tenderness. There is no rebound and no guarding.  Musculoskeletal: Normal range of motion. She exhibits no edema.  Neurological: She is alert and oriented to person, place, and time.  Skin: Skin is warm. No rash noted. No erythema.  Psychiatric: Affect and judgment normal.   LABORATORY PANEL:   CBC  Recent Labs Lab 05/12/16 0441  WBC 20.5*  HGB 11.2*  HCT 33.8*  PLT 245   ------------------------------------------------------------------------------------------------------------------  Chemistries   Recent Labs Lab 05/12/16 0441  NA 137  K 4.8  CL 104  CO2 28  GLUCOSE 196*  BUN 32*  CREATININE 0.82  CALCIUM 7.8*   ------------------------------------------------------------------------------------------------------------------  Cardiac Enzymes  Recent Labs Lab 05/10/16 0700  TROPONINI 0.03*   ------------------------------------------------------------------------------------------------------------------  RADIOLOGY:  Dg Chest 1 View  05/12/2016  CLINICAL DATA:  Dyspnea EXAM: CHEST 1 VIEW COMPARISON:  05/10/2016 FINDINGS: Cardiac shadow is stable. Patchy infiltrates are again noted in the left upper lobe. The right lung remains clear. No other focal abnormality is noted. Aortic calcifications are again seen. IMPRESSION: Stable left upper lobe infiltrate. Aortic atherosclerosis. Electronically Signed   By: Alcide CleverMark  Lukens M.D.   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  * Acute on chronic respiratory failure due to left upper lobe pneumonia and COPD  exacerbation with sepsis -IV steroids,  Antibiotics - Scheduled Nebulizers - Inhalers -Wean O2 as tolerated - Appreciate pulmonary help - BiPAP as needed  * Atrial fibrillation with RVR Heart rate improved with oral Cardizem and metoprolol. Still mildly tachycardic and will increase dose of oral Cardizem  * Accelerated hypertension: Continue diltiazem and lisinopril, metoprolol Increase dose of Cardizem today  * Diabetes: Continue sliding scale insulin with ADA diet.  * Shin ulcer: Cleanse wound to left lower leg with NS and pat gently dry. Santyl ointment to wound bed. Cover with NS moist gauze, 4x4 gauze and kerlix.Change daily  Management plans discussed with the patient and she is in agreement.  CODE STATUS: DO NOT RESUSCITATE   TOTAL TIME TAKING CARE OF THIS PATIENT: 36 minutes.   Milagros LollSudini, Shannyn Jankowiak R M.D on 05/12/2016 at 10:34 AM  Between 7am to 6pm - Pager - [phone] After 6pm go to [email or web address] - password [employer] [facility]  [facility] [place] [facility]  Office  [phone]  CC: Primary care physician; Fidel LevyJames Hawkins Jr, MD  Note: This dictation was prepared with Dragon dictation along with smaller phrase technology. Any transcriptional errors that result from this process are unintentional.

## 2016-05-12 NOTE — Progress Notes (Signed)
Pharmacy Antibiotic Note  [name] is a 70 y.o. female admitted on 05/10/2016 with pneumonia and sepsis.  Pharmacy has been consulted for Meropenem dosing.  (PCN allergy)  Plan: 7/1- Abx changed from Levaquin to Meropenem on 6/30 per ICU MD- per note: Will increase antibiotic coverage given her lack of significant improvement.   Height: 5\' 2"  (157.5 cm) Weight: 157 lb 10.1 oz (71.5 kg) IBW/kg (Calculated) : 50.1  Temp (24hrs), Avg:98.1 F (36.7 C), Min:97.7 F (36.5 C), Max:98.5 F (36.9 C)   Recent Labs Lab 05/10/16 0700 05/10/16 0751 05/11/16 0507 05/12/16 0441  WBC 24.5*  --  15.8* 20.5*  CREATININE 0.72  --  0.71 0.82  LATICACIDVEN  --  1.4  --   --     Estimated Creatinine Clearance: 58.3 mL/min (by C-G formula based on Cr of 0.82).    Allergies  Allergen Reactions  . Penicillins Rash    Antimicrobials this admission: levofloxacin 6/29 >> 6/30 Meropenem 6/30 >>  Dose adjustments this admission:  Microbiology results: 6/29 BCx x 2: no growth x 2 day 6/29 UCx: contaminant  6/29 Sputum: abundant gpc, gram variable rods- cx reincubated for better growth. 6/29 MRSA PCR: negative   Pharmacy will continue to monitor and adjust per consult.    [name] A 05/12/2016 9:44 AM

## 2016-05-12 NOTE — Progress Notes (Signed)
Inpatient Diabetes Program Recommendations  AACE/ADA: New Consensus Statement on Inpatient Glycemic Control (2015)  Target Ranges:  Prepandial:   less than 140 mg/dL      Peak postprandial:   less than 180 mg/dL (1-2 hours)      Critically ill patients:  140 - 180 mg/dL   Lab Results  Component Value Date   GLUCAP 300* 05/12/2016   HGBA1C 6.8% 03/19/2016    Review of Glycemic Control:  Results for [name], Anvika H (MRN [id number]) as of 05/12/2016 14:15  Ref. Range 05/11/2016 07:07 05/11/2016 11:21 05/11/2016 16:40 05/11/2016 21:34 05/12/2016 07:06 05/12/2016 11:51  Glucose-Capillary Latest Ref Range: 65-99 mg/dL 409193 (H) 811321 (H) 914168 (H) 290 (H) 194 (H) 300 (H)   Inpatient Diabetes Program Recommendations:    While on steroids, consider adding Novolog meal coverage 3 units tid with meals-Hold if patient eats less than 50%.  Thanks, Beryl MeagerJenny Taneil Lazarus, RN, BC-ADM Inpatient Diabetes Coordinator Pager [phone] (8a-5p)

## 2016-05-12 NOTE — Progress Notes (Addendum)
[facility] Critical Care Medicine Progess Note    ASSESSMENT/PLAN   70 year old female with history of diabetes, stage IV COPD now presenting with community-acquired pneumonia. Patient on BiPAP off-and-on, DNR.   ASSESSMENT / PLAN:  PULMONARY A: Acute hypoxemic respiratory failure, Patient feels slightly better than yesterday. 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  P:  Continue BiPAP daily at bedtime, and as needed during the day. Continue broadened antibiotic coverage. Currently on cimetidine on 60 mg IV every 8, will continue. Continue DuoNeb Incentive spirometer/ flutter valve  CARDIOVASCULAR A:  Status post A. fib with RVR, now controlled with oral agents, has been weaned off of diltiazem drip. History of hypertension History of hyperlipidemia  P:   Continue Lisinopril/metoprolol Continue pravastatin Continue lasix  RENAL A:  No active issues  P:  Replace electrolytes per ICU protocol  Chem-7 in am  GASTROINTESTINAL A:  No active issues  P:  Heart healthy diet   HEMATOLOGIC A:  Continues to have elevated INR  P:  Hold Coumadin for now  Pharmacy consulted for coumadin dosing Transfuse if Hgb <7   INFECTIOUS A:  Community-acquired pneumonia Leukocytosis, likely due to steroids. Ulcer on left shin P:  Monitor fever curve Antibiotics as above Followed by wound nurse   CULTURES: 6/29 sputum culture>> negative thus far 6/29 urine culture>> negative 629 blood culture>> negative thus far 6/29: MRSA PCR negative  ANTIBIOTICS: 6/29 levofloxacin>>6/30 Merrem 6/30>>  ENDOCRINE A:  Diabetes mellitus, hyperglycemia due to steroids. P:  Blood sugar checks before meals at bedtime  *Lantus 7 units daily at bedtime today Continue Sliding scale insulin coverage   NEUROLOGIC A:   anxiety P:  RASS goal: 0 Minimize sedating medications Xanax PRN   SIGNIFICANT EVENTS: 6/29 patient admitted to the ICU on BiPAP with sepsis related to community-acquired pneumonia.  LINES/TUBES: none ---------------------------------------   ----------------------------------------   Name: [name] MRN: [id number] DOB: 1946/05/01    ADMISSION DATE:  05/10/2016       SUBJECTIVE:   Continues to be short of breath, but states that she is feeling better than yesterday.  Review of Systems:  Constitutional: Feels well. Cardiovascular: No chest pain.  Pulmonary: Denies dyspnea.   The remainder of systems were reviewed and were found to be negative other than what is documented in the HPI.    VITAL SIGNS: Temp:  [97.7 F (36.5 C)-98.5 F (36.9 C)] 97.7 F (36.5 C) (07/01 0000) Pulse Rate:  [79-124] 110 (07/01 0900) Resp:  [14-35] 35 (07/01 0900) BP: (114-176)/(73-123) 114/101 mmHg (07/01 0900) SpO2:  [90 %-98 %] 90 % (07/01 0900) FiO2 (%):  [35 %] 35 % (07/01 0500) HEMODYNAMICS:   VENTILATOR SETTINGS: Vent Mode:  [-]  FiO2 (%):  [35 %] 35 % INTAKE / OUTPUT:  Intake/Output Summary (Last 24 hours) at 05/12/16 1037 Last data filed at 05/12/16 0958  Gross per 24 hour  Intake 1836.25 ml  Output      0 ml  Net 1836.25 ml    PHYSICAL EXAMINATION: Physical Examination:   VS: BP 114/101 mmHg  Pulse 110  Temp(Src) 97.7 F (36.5 C) (Axillary)  Resp 35  Ht 5\' 2"  (1.575 m)  Wt 157 lb 10.1 oz (71.5 kg)  BMI 28.82 kg/m2  SpO2 90%  General Appearance: No distress  Neuro:without focal findings, mental status Anxious HEENT: PERRLA, EOM intact. Pulmonary: Bilateral expiratory wheezing, decreased air entry  bilaterally. CardiovascularNormal S1,S2.  No m/r/g.   Abdomen: Benign, Soft, non-tender. Renal:  No costovertebral tenderness  GU:  Not performed at this time. Endocrine: No evident thyromegaly. Skin:   warm, no rashes, no ecchymosis  Extremities:  normal, no cyanosis, clubbing.   LABS:   LABORATORY PANEL:   CBC  Recent Labs Lab 05/12/16 0441  WBC 20.5*  HGB 11.2*  HCT 33.8*  PLT 245    Chemistries   Recent Labs Lab 05/12/16 0441  NA 137  K 4.8  CL 104  CO2 28  GLUCOSE 196*  BUN 32*  CREATININE 0.82  CALCIUM 7.8*     Recent Labs Lab 05/10/16 2119 05/11/16 0707 05/11/16 1121 05/11/16 1640 05/11/16 2134 05/12/16 0706  GLUCAP 264* 193* 321* 168* 290* 194*   No results for input(s): PHART, PCO2ART, PO2ART in the last 168 hours. No results for input(s): AST, ALT, ALKPHOS, BILITOT, ALBUMIN in the last 168 hours.  Cardiac Enzymes  Recent Labs Lab 05/10/16 0700  TROPONINI 0.03*    RADIOLOGY:  Dg Chest 1 View  05/12/2016  CLINICAL DATA:  Dyspnea EXAM: CHEST 1 VIEW COMPARISON:  05/10/2016 FINDINGS: Cardiac shadow is stable. Patchy infiltrates are again noted in the left upper lobe. The right lung remains clear. No other focal abnormality is noted. Aortic calcifications are again seen. IMPRESSION: Stable left upper lobe infiltrate. Aortic atherosclerosis. Electronically Signed   By: Alcide CleverMark  Lukens M.D.   On: 05/12/2016 07:21       --Wells Guileseep Ryon Layton, MD.  ICU Pager: [phone] [facility] Office Number: [phone]  Santiago Gladavid Kasa, M.D.  Stephanie AcreVishal Mungal, M.D.  Billy Fischeravid Simonds, M.D  05/12/2016   Critical Care Attestation.  I have personally obtained a history, examined the patient, evaluated laboratory and imaging results, formulated the assessment and plan and placed orders. The Patient requires high complexity decision making for assessment and support, frequent evaluation and titration of therapies, application of advanced monitoring technologies and extensive interpretation of multiple databases. The patient has critical illness that could lead imminently to failure of 1 or more organ systems and requires the highest level of physician preparedness to intervene.  Critical Care  Time devoted to patient care services described in this note is 45 minutes and is exclusive of time spent in procedures.

## 2016-05-12 NOTE — Progress Notes (Signed)
ANTICOAGULATION CONSULT NOTE -follow up Consult  Pharmacy Consult for Coumadin Indication: atrial fibrillation  Allergies  Allergen Reactions  . Penicillins Rash    Patient Measurements: Height: 5\' 2"  (157.5 cm) Weight: 157 lb 10.1 oz (71.5 kg) IBW/kg (Calculated) : 50.1  Vital Signs: Temp: 97.7 F (36.5 C) (07/01 0000) Temp Source: Axillary (07/01 0000) BP: 176/91 mmHg (07/01 0500) Pulse Rate: 79 (07/01 0500)  Labs:  Recent Labs  05/10/16 0700 05/11/16 0507 05/12/16 0441  HGB 12.9 11.1* 11.2*  HCT 38.5 33.4* 33.8*  PLT 285 226 245  APTT 45*  --   --   LABPROT 39.8* 40.4* 33.6*  INR 4.25* 4.31* 3.40  CREATININE 0.72 0.71 0.82  TROPONINI 0.03*  --   --     Estimated Creatinine Clearance: 58.3 mL/min (by C-G formula based on Cr of 0.82).   Medical History: Past Medical History  Diagnosis Date  . COPD (chronic obstructive pulmonary disease) (HCC) Jan 2014  . A-fib (HCC)   . Coronary artery disease     Medications:  Prescriptions prior to admission  Medication Sig Dispense Refill Last Dose  . albuterol (PROVENTIL) (2.5 MG/3ML) 0.083% nebulizer solution Take 2.5 mg by nebulization every 6 (six) hours as needed for wheezing.   prn at prn  . budesonide-formoterol (SYMBICORT) 160-4.5 MCG/ACT inhaler Inhale 2 puffs into the lungs 2 (two) times daily.    05/09/2016 at Unknown time  . collagenase (SANTYL) ointment Apply 1 application topically daily.   05/09/2016 at Unknown time  . diltiazem (CARDIZEM CD) 180 MG 24 hr capsule Take 1 capsule (180 mg total) by mouth daily. 90 capsule 3 05/09/2016 at Unknown time  . furosemide (LASIX) 20 MG tablet Take 20 mg by mouth daily.   05/09/2016 at Unknown time  . glucose blood (ACCU-CHEK AVIVA PLUS) test strip Check sugar 1 to 2 times a day. Dx: E11.9 100 each 11   . lisinopril (PRINIVIL,ZESTRIL) 40 MG tablet Take 1 tablet (40 mg total) by mouth daily. 90 tablet 3 05/09/2016 at Unknown time  . magic mouthwash SOLN Take 5 mLs by mouth  3 (three) times daily as needed for mouth pain. 100 mL 0 prn at prn  . metFORMIN (GLUCOPHAGE) 500 MG tablet Take 1 tablet (500 mg total) by mouth daily. 90 tablet 3 05/09/2016 at Unknown time  . metoprolol (LOPRESSOR) 100 MG tablet Take 1 tablet (100 mg total) by mouth 2 (two) times daily. 180 tablet 3 05/09/2016 at Unknown time  . ofloxacin (OCUFLOX) 0.3 % ophthalmic solution Place 5 drops into both ears as needed.  0 prn at prn  . OXYGEN-HELIUM IN Inhale 2 L into the lungs continuous. 2 liters of oxygen.   Taking  . potassium chloride SA (K-DUR,KLOR-CON) 20 MEQ tablet Take 20 mEq by mouth 2 (two) times daily.    05/09/2016 at Unknown time  . pravastatin (PRAVACHOL) 80 MG tablet Take 80 mg by mouth daily.   05/09/2016 at Unknown time  . predniSONE (DELTASONE) 10 MG tablet Take 10-40 mg by mouth as directed. Take 40mg  daily for 2 days, 30mg  daily for 2 days, 20mg  daily for 2 days, 10mg  daily for 2 days  0 05/09/2016 at Unknown time  . tiotropium (SPIRIVA) 18 MCG inhalation capsule Place 18 mcg into inhaler and inhale daily.    05/09/2016 at Unknown time  . warfarin (COUMADIN) 3 MG tablet Take 3 mg by mouth See admin instructions. Take 3mg  daily on Tuesday, Wednesday, Friday, Saturday, Sunday   05/09/2016 at  Unknown time   Scheduled:  . antiseptic oral rinse  7 mL Mouth Rinse q12n4p  . chlorhexidine  15 mL Mouth Rinse BID  . collagenase   Topical Daily  . diltiazem  60 mg Oral Q6H  . docusate sodium  100 mg Oral BID  . furosemide  20 mg Oral Daily  . insulin aspart  0-20 Units Subcutaneous TID WC  . insulin aspart  0-5 Units Subcutaneous QHS  . insulin glargine  7 Units Subcutaneous QHS  . ipratropium-albuterol  3 mL Nebulization Q4H  . lisinopril  40 mg Oral Daily  . meropenem (MERREM) IV  1 g Intravenous Q8H  . methylPREDNISolone (SOLU-MEDROL) injection  60 mg Intravenous Q8H  . metoprolol  100 mg Oral BID  . mometasone-formoterol  2 puff Inhalation BID  . potassium chloride SA  20 mEq Oral BID   . pravastatin  80 mg Oral Daily  . tiotropium  18 mcg Inhalation Daily  . Warfarin - Pharmacist Dosing Inpatient   Does not apply q1800   Infusions:  . diltiazem (CARDIZEM) infusion Stopped (05/11/16 1217)    Assessment:  Pharmacy consulted for warfarin dosing for 70 yo female with history of atrial fibrillation, goal INR 2-3. Patient is on antibiotics for CAP.  On Warfarin 3mg  Tuesday, Wed, Fri, Sat, Sun at home.  Goal of Therapy:  INR 2-3   Plan :  INR remains elevated at 3.40.  Will continue to hold warfarin and obtain INR with am labs. On Meropenem  Pharmacy will continue to monitor and adjust per consult.    Orman Matsumura A 05/12/2016,9:38 AM

## 2016-05-12 NOTE — Plan of Care (Signed)
Problem: Respiratory: Goal: Respiratory status will improve Outcome: Not Progressing Required Bipap throughout the night.  O2 saturation dropped to 81% while off at midnight to take medication. Goal: Pain level will decrease Outcome: Progressing Denies pain for this shift

## 2016-05-13 DIAGNOSIS — J690 Pneumonitis due to inhalation of food and vomit: Secondary | ICD-10-CM

## 2016-05-13 LAB — CBC
HCT: 33.9 % — ABNORMAL LOW (ref 35.0–47.0)
HEMOGLOBIN: 11.3 g/dL — AB (ref 12.0–16.0)
MCH: 31.6 pg (ref 26.0–34.0)
MCHC: 33.3 g/dL (ref 32.0–36.0)
MCV: 94.8 fL (ref 80.0–100.0)
PLATELETS: 240 10*3/uL (ref 150–440)
RBC: 3.58 MIL/uL — ABNORMAL LOW (ref 3.80–5.20)
RDW: 14.7 % — AB (ref 11.5–14.5)
WBC: 18.8 10*3/uL — ABNORMAL HIGH (ref 3.6–11.0)

## 2016-05-13 LAB — BASIC METABOLIC PANEL
Anion gap: 4 — ABNORMAL LOW (ref 5–15)
BUN: 41 mg/dL — AB (ref 6–20)
CHLORIDE: 102 mmol/L (ref 101–111)
CO2: 30 mmol/L (ref 22–32)
CREATININE: 0.84 mg/dL (ref 0.44–1.00)
Calcium: 8.1 mg/dL — ABNORMAL LOW (ref 8.9–10.3)
GFR calc Af Amer: >60 mL/min (ref >60)
GFR calc non Af Amer: >60 mL/min (ref >60)
Glucose, Bld: 236 mg/dL — ABNORMAL HIGH (ref 65–99)
Potassium: 5.3 mmol/L — ABNORMAL HIGH (ref 3.5–5.1)
SODIUM: 136 mmol/L (ref 135–145)

## 2016-05-13 LAB — GLUCOSE, CAPILLARY
GLUCOSE-CAPILLARY: 199 mg/dL — AB (ref 65–99)
GLUCOSE-CAPILLARY: 207 mg/dL — AB (ref 65–99)
GLUCOSE-CAPILLARY: 178 mg/dL — AB (ref 65–99)
Glucose-Capillary: 307 mg/dL — ABNORMAL HIGH (ref 65–99)

## 2016-05-13 LAB — PROTIME-INR
INR: 1.95
PROTHROMBIN TIME: 22.1 s — AB (ref 11.4–15.0)

## 2016-05-13 LAB — CULTURE, RESPIRATORY

## 2016-05-13 LAB — CULTURE, RESPIRATORY W GRAM STAIN

## 2016-05-13 MED ORDER — LISINOPRIL 20 MG PO TABS: 40.0000 mg | ORAL_TABLET | Freq: Every day | ORAL | Status: DC

## 2016-05-13 MED ORDER — DEXTROSE 5 % IV SOLN
2.0000 g | Freq: Three times a day (TID) | INTRAVENOUS | Status: DC
  Administered 2016-05-13 – 2016-05-14 (×4): 2 g via INTRAVENOUS
  Filled 2016-05-13 (×6): qty 2

## 2016-05-13 MED ORDER — HYDRALAZINE HCL 25 MG PO TABS
25.0000 mg | ORAL_TABLET | Freq: Three times a day (TID) | ORAL | Status: DC
  Administered 2016-05-13 – 2016-05-16 (×7): 25 mg via ORAL
  Filled 2016-05-13 (×7): qty 1

## 2016-05-13 MED ORDER — CLONIDINE HCL 0.1 MG PO TABS
0.1000 mg | ORAL_TABLET | Freq: Three times a day (TID) | ORAL | Status: DC
  Administered 2016-05-13 – 2016-05-15 (×8): 0.1 mg via ORAL
  Filled 2016-05-13 (×10): qty 1

## 2016-05-13 MED ORDER — WARFARIN SODIUM 1 MG PO TABS: 2.0000 mg | ORAL_TABLET | Freq: Every day | ORAL | Status: DC

## 2016-05-13 MED ORDER — HYDRALAZINE HCL 25 MG PO TABS: 25.0000 mg | ORAL_TABLET | Freq: Two times a day (BID) | ORAL | Status: DC

## 2016-05-13 MED ORDER — DILTIAZEM HCL 60 MG PO TABS
60.0000 mg | ORAL_TABLET | Freq: Four times a day (QID) | ORAL | Status: DC | PRN
  Administered 2016-05-14: 60 mg via ORAL
  Filled 2016-05-13: qty 1

## 2016-05-13 NOTE — Progress Notes (Signed)
Pharmacy Antibiotic Note  [name] is a 70 y.o. female admitted on 05/10/2016 with pneumonia and sepsis.  Pharmacy has been consulted for Meropenem dosing.  (PCN allergy)  Plan: 7/1- Abx changed from Levaquin to Meropenem 1 gram IV q8hon 6/30 per ICU MD- per note: Will increase antibiotic coverage given her lack of significant improvement.   Height: 5\' 2"  (157.5 cm) Weight: 157 lb 10.1 oz (71.5 kg) IBW/kg (Calculated) : 50.1  Temp (24hrs), Avg:97.7 F (36.5 C), Min:97.7 F (36.5 C), Max:97.7 F (36.5 C)   Recent Labs Lab 05/10/16 0700 05/10/16 0751 05/11/16 0507 05/12/16 0441 05/13/16 0417  WBC 24.5*  --  15.8* 20.5* 18.8*  CREATININE 0.72  --  0.71 0.82 0.84  LATICACIDVEN  --  1.4  --   --   --     Estimated Creatinine Clearance: 56.9 mL/min (by C-G formula based on Cr of 0.84).    Allergies  Allergen Reactions  . Penicillins Rash    Antimicrobials this admission: levofloxacin 6/29 >> 6/30 Meropenem 6/30 >>  Dose adjustments this admission:  Microbiology results: 6/29 BCx x 2: no growth x 2 day 6/29 UCx: contaminant  6/29 Sputum: abundant gpc, gram variable rods- cx reincubated for better growth. 6/29 MRSA PCR: negative   Pharmacy will continue to monitor and adjust per consult.    Jayln Branscom A 05/13/2016 9:59 AM

## 2016-05-13 NOTE — Clinical Documentation Improvement (Signed)
[facility] at [facility]  Please document query responses in the progress notes and discharge summary, not on the CDI BPA form in [facility]. Thank you!  Please document if a condition below provides greater specificity regarding the patient's pneumonia:  - Pseudomonas Pneumonia  - Other type of Pneumonia  - Unable to clinically determine  Clinical Information: Sputum culture results 05/13/16 - Abundant Pseudomonas Aeruginosa Antibiotic changed to Ceftazidime 2 gm every 8 hours by Pharmacy 05/13/16 at 12:59 pm  Please exercise your independent, professional judgment when responding. A specific answer is not anticipated or expected.   Thank You, Jerral Ralphathy R Marvell Stavola  RN BSN CCDS [phone] Health Information Management [place]

## 2016-05-13 NOTE — Progress Notes (Signed)
Pharmacy Antibiotic Note  [name] is a 70 y.o. female admitted on 05/10/2016 with pneumonia and sepsis.  Pharmacy has been consulted for Meropenem dosing.  (PCN allergy-rash)  Plan: 7/1- Abx changed from Levaquin to Meropenem 1 gram IV q8hon 6/30 per ICU MD- per note: Will increase antibiotic coverage given her lack of significant improvement.  7/2- Sputum cx= Pseudomonas Aeurigonsa- pan Sensitive. Transition to Ceftazidime 2 gm q8h. (gram stain of sputum cx also had GPC?-f/u)   Height: 5\' 2"  (157.5 cm) Weight: 157 lb 10.1 oz (71.5 kg) IBW/kg (Calculated) : 50.1  Temp (24hrs), Avg:97.7 F (36.5 C), Min:97.7 F (36.5 C), Max:97.7 F (36.5 C)   Recent Labs Lab 05/10/16 0700 05/10/16 0751 05/11/16 0507 05/12/16 0441 05/13/16 0417  WBC 24.5*  --  15.8* 20.5* 18.8*  CREATININE 0.72  --  0.71 0.82 0.84  LATICACIDVEN  --  1.4  --   --   --     Estimated Creatinine Clearance: 56.9 mL/min (by C-G formula based on Cr of 0.84).    Allergies  Allergen Reactions  . Penicillins Rash    Antimicrobials this admission: levofloxacin 6/29 >> 6/30 Meropenem 6/30 >> 7/2 Ceftazidime 7/2 >>  Dose adjustments this admission:  Microbiology results: 6/29 BCx x 2: no growth x 2 day 6/29 UCx: contaminant  6/29 Sputum: abundant gpc, gram variable rods- cx- + Pseudomonas aeurginosa (pan sens) 6/29 MRSA PCR: negative   Pharmacy will continue to monitor and adjust per consult.    Aundra Pung A 05/13/2016 12:56 PM

## 2016-05-13 NOTE — Progress Notes (Signed)
[facility] Critical Care Medicine Progess Note    ASSESSMENT/PLAN   70 year old female with history of diabetes, stage IV COPD now presenting with community-acquired pneumonia. Patient on BiPAP off-and-on, DNR.  The patient has made very slow/minimal improvement, continues to require periodic BiPAP. She is interested in going home, "one more time", and will likely require hospice services in order to do this.  ASSESSMENT / PLAN:  PULMONARY A: Acute hypoxemic respiratory failure, Patient feels slightly better than yesterday. 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  P:  Continue BiPAP daily at bedtime, and as needed during the day. Continue broadened antibiotic coverage. Currently on steroids on 60 mg IV every 8, will continue. Continue DuoNeb Incentive spirometer/ flutter valve CXR in am  CARDIOVASCULAR A:  Status post A. fib with RVR, now controlled with oral agents, has been weaned off of diltiazem drip. History of hypertension-BP remains elevated History of hyperlipidemia  P:  Continue metoprolol 100mg  bid Will start clonidine 0.1 mg tid for BP.  Start hydralazine 25mg  q12h with holding parameters Continue Lisinopril Continue pravastatin Continue lasix  RENAL A:  Mild hyperkalemia-5.3  P:  D/C daily potassium supplements Replace electrolytes per ICU protocol  BMP daily   GASTROINTESTINAL A:  No active issues  P:  Heart healthy diet   HEMATOLOGIC A:  Elevated INR-INR normal today at 1.95 P:  Resume Coumadin per pharmacy Transfuse if Hgb <7   INFECTIOUS A:  Community-acquired pneumonia Leukocytosis, likely due to steroids. Ulcer on left shin P:  Monitor fever curve Antibiotics as above Followed by wound nurse  CULTURES: 6/29 sputum culture>> negative thus far 6/29 urine culture>> negative 629  blood culture>> negative thus far 6/29: MRSA PCR negative  ANTIBIOTICS: 6/29 levofloxacin>>6/30 Merrem 6/30>>  ENDOCRINE A:  Diabetes mellitus, hyperglycemia due to steroids. P:  -Blood sugar checks before meals at bedtime  -Lantus 7 units daily at bedtime today -Continue Sliding scale insulin coverage   NEUROLOGIC A:  anxiety P:  RASS goal: 0 Minimize sedating medications Xanax PRN   SIGNIFICANT EVENTS: 6/29 patient admitted to the ICU on BiPAP with sepsis related to community-acquired pneumonia.  LINES/TUBES: none ---------------------------------------   Name: [name] MRN: [id number] DOB: 24-Jan-1946    ADMISSION DATE:  05/10/2016   SUBJECTIVE:   Reports feeling better but still coughing a lot and gets SOB with exertion. Wheezing improved  Review of Systems:  Constitutional: Feels well. Cardiovascular: No chest pain.  Pulmonary: Denies dyspnea.   The remainder of systems were reviewed and were found to be negative other than what is documented in the HPI.    VITAL SIGNS: Temp:  [97.7 F (36.5 C)] 97.7 F (36.5 C) (07/01 2000) Pulse Rate:  [90-179] 90 (07/02 0500) Resp:  [13-37] 14 (07/02 0500) BP: (114-178)/(77-142) 159/104 mmHg (07/02 0500) SpO2:  [83 %-96 %] 93 % (07/02 0500) HEMODYNAMICS:   VENTILATOR SETTINGS:   INTAKE / OUTPUT:  Intake/Output Summary (Last 24 hours) at 05/13/16 0644 Last data filed at 05/13/16 0529  Gross per 24 hour  Intake    420 ml  Output      0 ml  Net    420 ml    PHYSICAL EXAMINATION:  VS: BP 159/104 mmHg  Pulse 90  Temp(Src) 97.7 F (36.5 C) (Oral)  Resp 14  Ht 5\' 2"  (1.575 m)  Wt 157 lb 10.1 oz (71.5 kg)  BMI 28.82 kg/m2  SpO2 93%   General Appearance: No distress  Neuro: AAO X3, speech is normal, no focal deficits HEENT: PERRLA, EOM intact. Pulmonary: Normal WOB, Bilateral expiratory wheezing, decreased air flow bilaterally. Cardiovascular: Tachycardic wth HR 118, Normal S1,S2.  No  m/r/g.   Abdomen: Benign, Soft, non-tender. Renal:  No costovertebral tenderness  Endocrine: No evident thyromegaly. Skin:   warm, no rashes, no ecchymosis  Extremities: 2 pulses, +1 edema, left LE with intact kerlex wrap, no cyanosis, clubbing.  LABS:  LABORATORY PANEL:   CBC  Recent Labs Lab 05/13/16 0417  WBC 18.8*  HGB 11.3*  HCT 33.9*  PLT 240   Chemistries   Recent Labs Lab 05/13/16 0417  NA 136  K 5.3*  CL 102  CO2 30  GLUCOSE 236*  BUN 41*  CREATININE 0.84  CALCIUM 8.1*    Recent Labs Lab 05/11/16 1640 05/11/16 2134 05/12/16 0706 05/12/16 1151 05/12/16 1544 05/12/16 2132  GLUCAP 168* 290* 194* 300* 209* 219*   No results for input(s): PHART, PCO2ART, PO2ART in the last 168 hours. No results for input(s): AST, ALT, ALKPHOS, BILITOT, ALBUMIN in the last 168 hours.  Cardiac Enzymes  Recent Labs Lab 05/10/16 0700  TROPONINI 0.03*    RADIOLOGY:  Dg Chest 1 View  05/12/2016  CLINICAL DATA:  Dyspnea EXAM: CHEST 1 VIEW COMPARISON:  05/10/2016 FINDINGS: Cardiac shadow is stable. Patchy infiltrates are again noted in the left upper lobe. The right lung remains clear. No other focal abnormality is noted. Aortic calcifications are again seen. IMPRESSION: Stable left upper lobe infiltrate. Aortic atherosclerosis. Electronically Signed   By: Alcide CleverMark  Lukens M.D.   On: 05/12/2016 07:21      Magdalene S. Tukov ANP-BC Pulmonary and Critical Care Medicine [facility] Pager [phone] or [phone]  Patient seen and examined with NP, agree with assessment and plan. 70 year old female, pneumonia with acute COPD exacerbation with end-stage stage IV COPD. Today, patient feels slightly better than yesterday, lungs are clear with decreased air entry bilaterally. Continue BiPAP daily at bedtime and as needed. Continue IV steroids and broad-spectrum IV antibiotics. Await further palliative care, discussions, as the patient will likely need hospice  assistance in order to transition to the home environment. -Wells Guileseep Marche Hottenstein M.D. 05/13/2016   Critical Care Attestation.  I have personally obtained a history, examined the patient, evaluated laboratory and imaging results, formulated the assessment and plan and placed orders. The Patient requires high complexity decision making for assessment and support, frequent evaluation and titration of therapies, application of advanced monitoring technologies and extensive interpretation of multiple databases. The patient has critical illness that could lead imminently to failure of 1 or more organ systems and requires the highest level of physician preparedness to intervene.  Critical Care Time devoted to patient care services described in this note is 35 minutes and is exclusive of time spent in procedures.

## 2016-05-13 NOTE — Progress Notes (Signed)
ANTICOAGULATION CONSULT NOTE -follow up Consult  Pharmacy Consult for Coumadin Indication: atrial fibrillation  Allergies  Allergen Reactions  . Penicillins Rash    Patient Measurements: Height: 5\' 2"  (157.5 cm) Weight: 157 lb 10.1 oz (71.5 kg) IBW/kg (Calculated) : 50.1  Vital Signs: BP: 191/143 mmHg (07/02 0800) Pulse Rate: 103 (07/02 0800)  Labs:  Recent Labs  05/11/16 0507 05/12/16 0441 05/13/16 0417  HGB 11.1* 11.2* 11.3*  HCT 33.4* 33.8* 33.9*  PLT 226 245 240  LABPROT 40.4* 33.6* 22.1*  INR 4.31* 3.40 1.95  CREATININE 0.71 0.82 0.84    Estimated Creatinine Clearance: 56.9 mL/min (by C-G formula based on Cr of 0.84).   Medical History: Past Medical History  Diagnosis Date  . COPD (chronic obstructive pulmonary disease) (HCC) Jan 2014  . A-fib (HCC)   . Coronary artery disease     Medications:  Prescriptions prior to admission  Medication Sig Dispense Refill Last Dose  . albuterol (PROVENTIL) (2.5 MG/3ML) 0.083% nebulizer solution Take 2.5 mg by nebulization every 6 (six) hours as needed for wheezing.   prn at prn  . budesonide-formoterol (SYMBICORT) 160-4.5 MCG/ACT inhaler Inhale 2 puffs into the lungs 2 (two) times daily.    05/09/2016 at Unknown time  . collagenase (SANTYL) ointment Apply 1 application topically daily.   05/09/2016 at Unknown time  . diltiazem (CARDIZEM CD) 180 MG 24 hr capsule Take 1 capsule (180 mg total) by mouth daily. 90 capsule 3 05/09/2016 at Unknown time  . furosemide (LASIX) 20 MG tablet Take 20 mg by mouth daily.   05/09/2016 at Unknown time  . glucose blood (ACCU-CHEK AVIVA PLUS) test strip Check sugar 1 to 2 times a day. Dx: E11.9 100 each 11   . lisinopril (PRINIVIL,ZESTRIL) 40 MG tablet Take 1 tablet (40 mg total) by mouth daily. 90 tablet 3 05/09/2016 at Unknown time  . magic mouthwash SOLN Take 5 mLs by mouth 3 (three) times daily as needed for mouth pain. 100 mL 0 prn at prn  . metFORMIN (GLUCOPHAGE) 500 MG tablet Take 1  tablet (500 mg total) by mouth daily. 90 tablet 3 05/09/2016 at Unknown time  . metoprolol (LOPRESSOR) 100 MG tablet Take 1 tablet (100 mg total) by mouth 2 (two) times daily. 180 tablet 3 05/09/2016 at Unknown time  . ofloxacin (OCUFLOX) 0.3 % ophthalmic solution Place 5 drops into both ears as needed.  0 prn at prn  . OXYGEN-HELIUM IN Inhale 2 L into the lungs continuous. 2 liters of oxygen.   Taking  . potassium chloride SA (K-DUR,KLOR-CON) 20 MEQ tablet Take 20 mEq by mouth 2 (two) times daily.    05/09/2016 at Unknown time  . pravastatin (PRAVACHOL) 80 MG tablet Take 80 mg by mouth daily.   05/09/2016 at Unknown time  . predniSONE (DELTASONE) 10 MG tablet Take 10-40 mg by mouth as directed. Take 40mg  daily for 2 days, 30mg  daily for 2 days, 20mg  daily for 2 days, 10mg  daily for 2 days  0 05/09/2016 at Unknown time  . tiotropium (SPIRIVA) 18 MCG inhalation capsule Place 18 mcg into inhaler and inhale daily.    05/09/2016 at Unknown time  . warfarin (COUMADIN) 3 MG tablet Take 3 mg by mouth See admin instructions. Take 3mg  daily on Tuesday, Wednesday, Friday, Saturday, Sunday   05/09/2016 at Unknown time   Scheduled:  . antiseptic oral rinse  7 mL Mouth Rinse q12n4p  . chlorhexidine  15 mL Mouth Rinse BID  . cloNIDine  0.1  mg Oral TID  . collagenase   Topical Daily  . docusate sodium  100 mg Oral BID  . furosemide  20 mg Oral Daily  . hydrALAZINE  25 mg Oral Q8H  . insulin aspart  0-20 Units Subcutaneous TID WC  . insulin aspart  0-5 Units Subcutaneous QHS  . insulin glargine  7 Units Subcutaneous QHS  . ipratropium-albuterol  3 mL Nebulization Q4H  . [START ON 05/14/2016] lisinopril  40 mg Oral Daily  . meropenem (MERREM) IV  1 g Intravenous Q8H  . methylPREDNISolone (SOLU-MEDROL) injection  60 mg Intravenous Q8H  . metoprolol  100 mg Oral BID  . mometasone-formoterol  2 puff Inhalation BID  . pravastatin  80 mg Oral Daily  . tiotropium  18 mcg Inhalation Daily  . Warfarin - Pharmacist Dosing  Inpatient   Does not apply q1800   Infusions:     Assessment:  Pharmacy consulted for warfarin dosing for 70 yo female with history of atrial fibrillation, goal INR 2-3. Patient is on antibiotics for CAP.  On Warfarin 3mg  Tuesday, Wed, Fri, Sat, Sun at home.  6/30 INR 4.31  Hold dose 7/1   INR 3.40  Hold dose 7/2   INR 1.95  Goal of Therapy:  INR 2-3   Plan :  INR remains elevated at 3.40.  Will continue to hold warfarin and obtain INR with am labs. On Meropenem  7/2- Will resume Warfarin at slightly lower dose of 2 mg daily. Patient on Meropenem.  F/u INR with am labs.   Belvie Iribe A 05/13/2016,9:52 AM

## 2016-05-13 NOTE — Progress Notes (Signed)
[facility] -  at [facility]   PATIENT NAME: [name]    MR#:  [id number]  DATE OF BIRTH:  1946-06-26  SUBJECTIVE:   Feels better. Still has SOB BIPAP at night No chest pain. Afebrile.  REVIEW OF SYSTEMS:    Review of Systems  Constitutional: Positive for malaise/fatigue. Negative for fever and chills.  HENT: Negative for ear discharge, ear pain, hearing loss, nosebleeds and sore throat.   Eyes: Negative for blurred vision and pain.  Respiratory: Positive for cough, shortness of breath and wheezing. Negative for hemoptysis.   Cardiovascular: Positive for palpitations. Negative for chest pain and leg swelling.  Gastrointestinal: Negative for nausea, vomiting, abdominal pain, diarrhea and blood in stool.  Genitourinary: Negative for dysuria.  Musculoskeletal: Negative for back pain.  Neurological: Positive for weakness. Negative for dizziness, tremors, speech change, focal weakness, seizures and headaches.  Endo/Heme/Allergies: Does not bruise/bleed easily.  Psychiatric/Behavioral: Negative for depression, suicidal ideas and hallucinations.   DRUG ALLERGIES:   Allergies  Allergen Reactions  . Penicillins Rash   VITALS:  Blood pressure 191/143, pulse 103, temperature 97.7 F (36.5 C), temperature source Oral, resp. rate 25, height 5\' 2"  (1.575 m), weight 71.5 kg (157 lb 10.1 oz), SpO2 93 %.  PHYSICAL EXAMINATION:   Physical Exam  Constitutional: She is oriented to person, place, and time. She appears distressed.  Increased work of breathing  HENT:  Head: Normocephalic.  Eyes: No scleral icterus.  Neck: Normal range of motion. Neck supple. No JVD present. No tracheal deviation present.  Cardiovascular: Normal rate, regular rhythm and normal heart sounds.  Exam reveals no gallop and no friction rub.   No murmur heard. Tachycardia with irregular irregular heartbeat  Pulmonary/Chest: No respiratory distress. She has wheezes. She has no rales. She  exhibits no tenderness.  Wheezing bilaterally lungs are tight  Abdominal: Soft. Bowel sounds are normal. She exhibits no distension and no mass. There is no tenderness. There is no rebound and no guarding.  Musculoskeletal: Normal range of motion. She exhibits no edema.  Neurological: She is alert and oriented to person, place, and time.  Skin: Skin is warm. No rash noted. No erythema.  Psychiatric: Affect and judgment normal.   LABORATORY PANEL:   CBC  Recent Labs Lab 05/13/16 0417  WBC 18.8*  HGB 11.3*  HCT 33.9*  PLT 240   ------------------------------------------------------------------------------------------------------------------  Chemistries   Recent Labs Lab 05/13/16 0417  NA 136  K 5.3*  CL 102  CO2 30  GLUCOSE 236*  BUN 41*  CREATININE 0.84  CALCIUM 8.1*   ------------------------------------------------------------------------------------------------------------------  Cardiac Enzymes  Recent Labs Lab 05/10/16 0700  TROPONINI 0.03*   ------------------------------------------------------------------------------------------------------------------  RADIOLOGY:  Dg Chest 1 View  05/12/2016  CLINICAL DATA:  Dyspnea EXAM: CHEST 1 VIEW COMPARISON:  05/10/2016 FINDINGS: Cardiac shadow is stable. Patchy infiltrates are again noted in the left upper lobe. The right lung remains clear. No other focal abnormality is noted. Aortic calcifications are again seen. IMPRESSION: Stable left upper lobe infiltrate. Aortic atherosclerosis. Electronically Signed   By: Alcide CleverMark  Lukens M.D.   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  * Acute on chronic respiratory failure due to left upper lobe pneumonia and COPD exacerbation with sepsis -IV steroids, Antibiotics- Meropenem - Scheduled Nebulizers -  Inhalers -Wean O2 as tolerated - Appreciate  pulmonary help - BiPAP at night  * Atrial fibrillation with RVR Heart rate improved with oral Cardizem and metoprolol.   * Accelerated hypertension: Continue diltiazem and lisinopril, metoprolol. Increased dose of Cardizem yesterday Add hydralazine today  * Diabetes: Continue sliding scale insulin with ADA diet.  * Shin ulcer: Cleanse wound to left lower leg with NS and pat gently dry. Santyl ointment to wound bed. Cover with NS moist gauze, 4x4 gauze and kerlix.Change daily  Management plans discussed with the patient and she is in agreement.  CODE STATUS: DO NOT RESUSCITATE   TOTAL TIME TAKING CARE OF THIS PATIENT: 30 minutes.   Milagros LollSudini, Sami Roes R M.D on 05/13/2016 at 12:15 PM  Between 7am to 6pm - Pager - [phone]  After 6pm go to [email or web address] - password [email or web address]  [facility]  Office  [phone]  CC: Primary care physician; Fidel LevyJames Hawkins Jr, MD  Note: This dictation was prepared with Dragon dictation along with smaller phrase technology. Any transcriptional errors that result from this process are unintentional.

## 2016-05-14 ENCOUNTER — Inpatient Hospital Stay: Payer: Medicare Other

## 2016-05-14 LAB — CBC
HCT: 33.9 % — ABNORMAL LOW (ref 35.0–47.0)
HEMOGLOBIN: 11.3 g/dL — AB (ref 12.0–16.0)
MCH: 31.7 pg (ref 26.0–34.0)
MCHC: 33.4 g/dL (ref 32.0–36.0)
MCV: 95 fL (ref 80.0–100.0)
Platelets: 223 10*3/uL (ref 150–440)
RBC: 3.56 MIL/uL — AB (ref 3.80–5.20)
RDW: 14.7 % — ABNORMAL HIGH (ref 11.5–14.5)
WBC: 17.4 10*3/uL — ABNORMAL HIGH (ref 3.6–11.0)

## 2016-05-14 LAB — BASIC METABOLIC PANEL
ANION GAP: 4 — AB (ref 5–15)
BUN: 46 mg/dL — AB (ref 6–20)
CHLORIDE: 95 mmol/L — AB (ref 101–111)
CO2: 33 mmol/L — ABNORMAL HIGH (ref 22–32)
Calcium: 8.2 mg/dL — ABNORMAL LOW (ref 8.9–10.3)
Creatinine, Ser: 0.97 mg/dL (ref 0.44–1.00)
GFR calc Af Amer: >60 mL/min (ref >60)
GFR calc non Af Amer: 57 mL/min — ABNORMAL LOW (ref >60)
Glucose, Bld: 230 mg/dL — ABNORMAL HIGH (ref 65–99)
POTASSIUM: 5.4 mmol/L — AB (ref 3.5–5.1)
SODIUM: 132 mmol/L — AB (ref 135–145)

## 2016-05-14 LAB — GLUCOSE, CAPILLARY
GLUCOSE-CAPILLARY: 211 mg/dL — AB (ref 65–99)
GLUCOSE-CAPILLARY: 214 mg/dL — AB (ref 65–99)
GLUCOSE-CAPILLARY: 189 mg/dL — AB (ref 65–99)
Glucose-Capillary: 241 mg/dL — ABNORMAL HIGH (ref 65–99)
Glucose-Capillary: 159 mg/dL — ABNORMAL HIGH (ref 65–99)

## 2016-05-14 LAB — PROTIME-INR
INR: 1.56
PROTHROMBIN TIME: 18.7 s — AB (ref 11.4–15.0)

## 2016-05-14 LAB — MAGNESIUM: MAGNESIUM: 2.6 mg/dL — AB (ref 1.7–2.4)

## 2016-05-14 LAB — PHOSPHORUS: PHOSPHORUS: 2.9 mg/dL (ref 2.5–4.6)

## 2016-05-14 MED ORDER — SODIUM CHLORIDE 0.9 % IV BOLUS (SEPSIS)
250.0000 mL | Freq: Once | INTRAVENOUS | Status: AC
  Administered 2016-05-14: 250 mL via INTRAVENOUS

## 2016-05-14 MED ORDER — DEXTROSE 5 % IV SOLN
5.0000 mg/h | INTRAVENOUS | Status: DC
  Administered 2016-05-14: 5 mg/h via INTRAVENOUS
  Administered 2016-05-15 – 2016-05-16 (×2): 7.5 mg/h via INTRAVENOUS
  Filled 2016-05-14 (×4): qty 100

## 2016-05-14 MED ORDER — WARFARIN SODIUM 1 MG PO TABS
4.0000 mg | ORAL_TABLET | ORAL | Status: AC
  Administered 2016-05-14: 4 mg via ORAL
  Filled 2016-05-14: qty 4

## 2016-05-14 MED ORDER — DILTIAZEM LOAD VIA INFUSION
10.0000 mg | Freq: Once | INTRAVENOUS | Status: AC
  Administered 2016-05-14: 10 mg via INTRAVENOUS
  Filled 2016-05-14: qty 10

## 2016-05-14 MED ORDER — IPRATROPIUM-ALBUTEROL 0.5-2.5 (3) MG/3ML IN SOLN
3.0000 mL | RESPIRATORY_TRACT | Status: DC | PRN
  Filled 2016-05-14: qty 3

## 2016-05-14 MED ORDER — SODIUM POLYSTYRENE SULFONATE 15 GM/60ML PO SUSP
30.0000 g | Freq: Once | ORAL | Status: AC
  Administered 2016-05-14: 30 g via ORAL
  Filled 2016-05-14: qty 120

## 2016-05-14 MED ORDER — PROMETHAZINE HCL 25 MG/ML IJ SOLN
12.5000 mg | Freq: Four times a day (QID) | INTRAMUSCULAR | Status: DC | PRN
  Administered 2016-05-14 – 2016-05-15 (×2): 12.5 mg via INTRAVENOUS
  Filled 2016-05-14 (×2): qty 1

## 2016-05-14 MED ORDER — METHYLPREDNISOLONE SODIUM SUCC 40 MG IJ SOLR
40.0000 mg | Freq: Two times a day (BID) | INTRAMUSCULAR | Status: DC
  Administered 2016-05-15: 40 mg via INTRAVENOUS
  Filled 2016-05-14: qty 1

## 2016-05-14 MED ORDER — IPRATROPIUM-ALBUTEROL 0.5-2.5 (3) MG/3ML IN SOLN
3.0000 mL | Freq: Four times a day (QID) | RESPIRATORY_TRACT | Status: DC
  Filled 2016-05-14 (×2): qty 3

## 2016-05-14 MED ORDER — CIPROFLOXACIN HCL 250 MG PO TABS
500.0000 mg | ORAL_TABLET | Freq: Two times a day (BID) | ORAL | Status: DC
  Administered 2016-05-14 – 2016-05-16 (×4): 500 mg via ORAL
  Filled 2016-05-14 (×5): qty 2

## 2016-05-14 NOTE — Progress Notes (Signed)
Spoke with Dr. Meredeth IdeFleming. He will see patient and make further pulmonary recommendations.  Thank you for consulting [facility], we will signoff at this time.  Please feel free to contact us with any questions at [phone] (please enter 7-digits).  Stephanie AcreVishal Jadin Creque, MD [facility] Pager [phone]- [phone] (please enter 7-digits) On Call Pager - [phone] (please enter 7-digits)

## 2016-05-14 NOTE — Progress Notes (Signed)
[name] is a 70 y.o. female  [id number]  Primary Cardiologist: Toryn Mcclinton Reason for Consultation: atrial fibrillation with RVR  HPI: 16 YOF presented to hospital with afib and RVR and pneumonia. Patient has pseudomonas growing in sputum and has elevated WBC     Past Medical History  Diagnosis Date  . COPD (chronic obstructive pulmonary disease) (HCC) Jan 2014  . A-fib (HCC)   . Coronary artery disease     Medications Prior to Admission  Medication Sig Dispense Refill  . albuterol (PROVENTIL) (2.5 MG/3ML) 0.083% nebulizer solution Take 2.5 mg by nebulization every 6 (six) hours as needed for wheezing.    . budesonide-formoterol (SYMBICORT) 160-4.5 MCG/ACT inhaler Inhale 2 puffs into the lungs 2 (two) times daily.     . collagenase (SANTYL) ointment Apply 1 application topically daily.    Marland Kitchen diltiazem (CARDIZEM CD) 180 MG 24 hr capsule Take 1 capsule (180 mg total) by mouth daily. 90 capsule 3  . furosemide (LASIX) 20 MG tablet Take 20 mg by mouth daily.    Marland Kitchen glucose blood (ACCU-CHEK AVIVA PLUS) test strip Check sugar 1 to 2 times a day. Dx: E11.9 100 each 11  . lisinopril (PRINIVIL,ZESTRIL) 40 MG tablet Take 1 tablet (40 mg total) by mouth daily. 90 tablet 3  . magic mouthwash SOLN Take 5 mLs by mouth 3 (three) times daily as needed for mouth pain. 100 mL 0  . metFORMIN (GLUCOPHAGE) 500 MG tablet Take 1 tablet (500 mg total) by mouth daily. 90 tablet 3  . metoprolol (LOPRESSOR) 100 MG tablet Take 1 tablet (100 mg total) by mouth 2 (two) times daily. 180 tablet 3  . ofloxacin (OCUFLOX) 0.3 % ophthalmic solution Place 5 drops into both ears as needed.  0  . OXYGEN-HELIUM IN Inhale 2 L into the lungs continuous. 2 liters of oxygen.    . potassium chloride SA (K-DUR,KLOR-CON) 20 MEQ tablet Take 20 mEq by mouth 2 (two) times daily.     . pravastatin (PRAVACHOL) 80 MG tablet Take 80 mg by mouth daily.    . predniSONE (DELTASONE) 10 MG tablet Take 10-40 mg by mouth as directed.  Take  daily for 2 days,  daily for 2 days,  daily for 2 days,  daily for 2 days  0  . tiotropium (SPIRIVA) 18 MCG inhalation capsule Place 18 mcg into inhaler and inhale daily.     Marland Kitchen warfarin (COUMADIN) 3 MG tablet Take 3 mg by mouth See admin instructions. Take  daily on Tuesday, Wednesday, Friday, Saturday, Sunday       . antiseptic oral rinse  7 mL Mouth Rinse q12n4p  . chlorhexidine  15 mL Mouth Rinse BID  . ciprofloxacin  500 mg Oral BID  . cloNIDine  0.1 mg Oral TID  . collagenase   Topical Daily  . docusate sodium  100 mg Oral BID  . furosemide  20 mg Oral Daily  . hydrALAZINE  25 mg Oral Q8H  . insulin aspart  0-20 Units Subcutaneous TID WC  . insulin aspart  0-5 Units Subcutaneous QHS  . insulin glargine  7 Units Subcutaneous QHS  . ipratropium-albuterol  3 mL Nebulization Q4H  . [START ON 05/15/2016] methylPREDNISolone (SOLU-MEDROL) injection  40 mg Intravenous Q12H  . metoprolol  100 mg Oral BID  . mometasone-formoterol  2 puff Inhalation BID  . pravastatin  80 mg Oral Daily  . tiotropium  18 mcg Inhalation Daily  . Warfarin - Pharmacist Dosing Inpatient  Does not apply q1800    Infusions:    Allergies  Allergen Reactions  . Penicillins Rash    Social History   Social History  . Marital Status: Divorced    Spouse Name: N/A  . Number of Children: N/A  . Years of Education: N/A   Occupational History  . Not on file.   Social History Main Topics  . Smoking status: Former Smoker -- 2.00 packs/day for 40 years    Types: Cigarettes  . Smokeless tobacco: Never Used  . Alcohol Use: No  . Drug Use: No  . Sexual Activity: Not on file   Other Topics Concern  . Not on file   Social History Narrative    Family History  Problem Relation Age of Onset  . Heart failure Father   . Stroke Mother   . Cancer Sister 7170    breast    PHYSICAL EXAM: Filed Vitals:   05/14/16 1108 05/14/16 1112  BP: 118/104 127/98  Pulse: 93 111  Temp: 97.7 F  (36.5 C) 97.8 F (36.6 C)  Resp: 20 20     Intake/Output Summary (Last 24 hours) at 05/14/16 1423 Last data filed at 05/14/16 1318  Gross per 24 hour  Intake    360 ml  Output   1600 ml  Net  -1240 ml    General:  Well appearing. No respiratory difficulty HEENT: normal Neck: supple. no JVD. Carotids 2+ bilat; no bruits. No lymphadenopathy or thryomegaly appreciated. Cor: PMI nondisplaced. Regular rate & rhythm. No rubs, gallops or murmurs. Lungs: clear Abdomen: soft, nontender, nondistended. No hepatosplenomegaly. No bruits or masses. Good bowel sounds. Extremities: no cyanosis, clubbing, rash, edema Neuro: alert & oriented x 3, cranial nerves grossly intact. moves all 4 extremities w/o difficulty. Affect pleasant.  ECG: Afib with RVR  Results for orders placed or performed during the hospital encounter of 05/10/16 (from the past 24 hour(s))  Glucose, capillary     Status: Abnormal   Collection Time: 05/13/16  4:29 PM  Result Value Ref Range   Glucose-Capillary 207 (H) 65 - 99 mg/dL   Comment 1 Notify RN   Glucose, capillary     Status: Abnormal   Collection Time: 05/13/16  9:13 PM  Result Value Ref Range   Glucose-Capillary 178 (H) 65 - 99 mg/dL   Comment 1 Notify RN   Protime-INR     Status: Abnormal   Collection Time: 05/14/16  3:13 AM  Result Value Ref Range   Prothrombin Time 18.7 (H) 11.4 - 15.0 seconds   INR 1.56   CBC     Status: Abnormal   Collection Time: 05/14/16  3:13 AM  Result Value Ref Range   WBC 17.4 (H) 3.6 - 11.0 K/uL   RBC 3.56 (L) 3.80 - 5.20 MIL/uL   Hemoglobin 11.3 (L) 12.0 - 16.0 g/dL   HCT 16.133.9 (L) 09.635.0 - 04.547.0 %   MCV 95.0 80.0 - 100.0 fL   MCH 31.7 26.0 - 34.0 pg   MCHC 33.4 32.0 - 36.0 g/dL   RDW 40.914.7 (H) 81.111.5 - 91.414.5 %   Platelets 223 150 - 440 K/uL  Basic metabolic panel     Status: Abnormal   Collection Time: 05/14/16  3:13 AM  Result Value Ref Range   Sodium 132 (L) 135 - 145 mmol/L   Potassium 5.4 (H) 3.5 - 5.1 mmol/L    Chloride 95 (L) 101 - 111 mmol/L   CO2 33 (H) 22 - 32 mmol/L  Glucose, Bld 230 (H) 65 - 99 mg/dL   BUN 46 (H) 6 - 20 mg/dL   Creatinine, Ser 1.610.97 0.44 - 1.00 mg/dL   Calcium 8.2 (L) 8.9 - 10.3 mg/dL   GFR calc non Af Amer 57 (L) >60 mL/min   GFR calc Af Amer >60 >60 mL/min   Anion gap 4 (L) 5 - 15  Magnesium     Status: Abnormal   Collection Time: 05/14/16  3:13 AM  Result Value Ref Range   Magnesium 2.6 (H) 1.7 - 2.4 mg/dL  Phosphorus     Status: None   Collection Time: 05/14/16  3:13 AM  Result Value Ref Range   Phosphorus 2.9 2.5 - 4.6 mg/dL  Glucose, capillary     Status: Abnormal   Collection Time: 05/14/16  7:13 AM  Result Value Ref Range   Glucose-Capillary 211 (H) 65 - 99 mg/dL   Comment 1 Notify RN   Glucose, capillary     Status: Abnormal   Collection Time: 05/14/16 11:11 AM  Result Value Ref Range   Glucose-Capillary 214 (H) 65 - 99 mg/dL   Comment 1 Notify RN    Dg Chest Port 1 View  05/14/2016  CLINICAL DATA:  Acute respiratory for a year, shortness of breath, history of coronary artery stent, COPD, former smoker. EXAM: PORTABLE CHEST 1 VIEW COMPARISON:  Portable chest x-ray of May 12, 2016 FINDINGS: The lungs are well-expanded. The interstitial markings are increased bilaterally greatest on the left. An area of subtle confluence in the upper lobe is present. The cardiac silhouette is mildly enlarged. The pulmonary vascularity is not engorged. There is no pleural effusion or pneumothorax. There is old deformity of the posterior lateral right eighth rib. There is calcification in the wall of the aortic arch. IMPRESSION: COPD. Mild pulmonary interstitial edema or pneumonia greatest on the left with an area of confluent airspace opacity in the left upper lobe. Overall there has not been significant interval change since yesterday's study. Aortic atherosclerosis. Electronically Signed   By: David  SwazilandJordan M.D.   On: 05/14/2016 07:23     ASSESSMENT AND PLAN: AFib with RVR.  Advise starting patient on IV amiodrone and get echo.  Jacquelyn Shadrick A

## 2016-05-14 NOTE — Progress Notes (Signed)
Patient complaining of heart burn and nausea. Vomiting x1. Declined heart burn medication. Patient declined all night time medication. States " I just cant take anything else I just want to throw up and then I'll feel better". Md notified. Acknowledged. New medication order. Medication given. Minimal improvement at this time. Attempted to give heart rate oral medication. Patient again declined. Patient requested I call sister to come visit for comfort. Sister is now at bedside. Patient attempting to rest. Cardizem drip increased to 7.5. Verified by Richardo PriestMarcel RN. Will continue to monitor.

## 2016-05-14 NOTE — Progress Notes (Addendum)
Inpatient Diabetes Program Recommendations  AACE/ADA: New Consensus Statement on Inpatient Glycemic Control (2015)  Target Ranges:  Prepandial:   less than 140 mg/dL      Peak postprandial:   less than 180 mg/dL (1-2 hours)      Critically ill patients:  140 - 180 mg/dL   Lab Results  Component Value Date   GLUCAP 214* 05/14/2016   HGBA1C 6.8% 03/19/2016    Review of Glycemic ControlResults for [name], Savannah H (MRN [id number]) as of 05/14/2016 12:16  Ref. Range 05/13/2016 11:55 05/13/2016 16:29 05/13/2016 21:13 05/14/2016 07:13 05/14/2016 11:11  Glucose-Capillary Latest Ref Range: 65-99 mg/dL 829307 (H) 562207 (H) 130178 (H) 211 (H) 214 (H)   Inpatient Diabetes Program Recommendations:    While on steroids in hospital, please consider adding Novolog meal coverage 3 units tid with meals. Note that A1C=6.8% and therefore patient should not need basal insulin at discharge (once steroids tapered).   Thanks, Savannah MeagerJenny Danielle Mink, RN, BC-ADM Inpatient Diabetes Coordinator Pager [phone] (8a-5p)

## 2016-05-14 NOTE — Progress Notes (Signed)
MD Sudini was notified of HR 140, order was to start cardizem gtt

## 2016-05-14 NOTE — Care Management (Signed)
More short of breath. LUL PNA and COPD exacerbation with sepsis. Continues IV antibiotics. Pulmonary consulting. HR- afib- on oral cardizem. Cardiology consulting. K 5.4, NA 132, WBC 17.4. Following.

## 2016-05-14 NOTE — Care Management Important Message (Signed)
Important Message  Patient Details  Name: [name] MRN: [id number] Date of Birth: 1946/03/12   Medicare Important Message Given:  Yes    Marily MemosLisa M Gessica Jawad, RN 05/14/2016, 2:29 PM

## 2016-05-14 NOTE — Progress Notes (Signed)
[employer] [facility] - [place] at [facility]   PATIENT NAME: [name]    MR#:  [id number]  DATE OF BIRTH:  October 06, 1946  SUBJECTIVE:   More short of breath today. Tachycardic in the 140s with A. Fib. No chest pain. Good appetite.  REVIEW OF SYSTEMS:    Review of Systems  Constitutional: Positive for malaise/fatigue. Negative for fever and chills.  HENT: Negative for ear discharge, ear pain, hearing loss, nosebleeds and sore throat.   Eyes: Negative for blurred vision and pain.  Respiratory: Positive for cough, shortness of breath and wheezing. Negative for hemoptysis.   Cardiovascular: Positive for palpitations. Negative for chest pain and leg swelling.  Gastrointestinal: Negative for nausea, vomiting, abdominal pain, diarrhea and blood in stool.  Genitourinary: Negative for dysuria.  Musculoskeletal: Negative for back pain.  Neurological: Positive for weakness. Negative for dizziness, tremors, speech change, focal weakness, seizures and headaches.  Endo/Heme/Allergies: Does not bruise/bleed easily.  Psychiatric/Behavioral: Negative for depression, suicidal ideas and hallucinations.   DRUG ALLERGIES:   Allergies  Allergen Reactions  . Penicillins Rash   VITALS:  Blood pressure 127/98, pulse 111, temperature 97.8 F (36.6 C), temperature source Oral, resp. rate 20, height 5\' 2"  (1.575 m), weight 71.5 kg (157 lb 10.1 oz), SpO2 94 %.  PHYSICAL EXAMINATION:   Physical Exam  Constitutional: She is oriented to person, place, and time. She appears distressed.  Increased work of breathing  HENT:  Head: Normocephalic.  Eyes: No scleral icterus.  Neck: Normal range of motion. Neck supple. No JVD present. No tracheal deviation present.  Cardiovascular: Normal rate, regular rhythm and normal heart sounds.  Exam reveals no gallop and no friction rub.   No murmur heard. Tachycardia with irregular irregular heartbeat  Pulmonary/Chest: No respiratory distress. She has wheezes.  She has no rales. She exhibits no tenderness.  Wheezing bilaterally lungs are tight  Abdominal: Soft. Bowel sounds are normal. She exhibits no distension and no mass. There is no tenderness. There is no rebound and no guarding.  Musculoskeletal: Normal range of motion. She exhibits no edema.  Neurological: She is alert and oriented to person, place, and time.  Skin: Skin is warm. No rash noted. No erythema.  Psychiatric: Affect and judgment normal.   LABORATORY PANEL:   CBC  Recent Labs Lab 05/14/16 0313  WBC 17.4*  HGB 11.3*  HCT 33.9*  PLT 223   ------------------------------------------------------------------------------------------------------------------  Chemistries   Recent Labs Lab 05/14/16 0313  NA 132*  K 5.4*  CL 95*  CO2 33*  GLUCOSE 230*  BUN 46*  CREATININE 0.97  CALCIUM 8.2*  MG 2.6*   ------------------------------------------------------------------------------------------------------------------  Cardiac Enzymes  Recent Labs Lab 05/10/16 0700  TROPONINI 0.03*   ------------------------------------------------------------------------------------------------------------------  RADIOLOGY:  Dg Chest Port 1 View  05/14/2016  CLINICAL DATA:  Acute respiratory for a year, shortness of breath, history of coronary artery stent, COPD, former smoker. EXAM: PORTABLE CHEST 1 VIEW COMPARISON:  Portable chest x-ray of May 12, 2016 FINDINGS: The lungs are well-expanded. The interstitial markings are increased bilaterally greatest on the left. An area of subtle confluence in the upper lobe is present. The cardiac silhouette is mildly enlarged. The pulmonary vascularity is not engorged. There is no pleural effusion or pneumothorax. There is old deformity of the posterior lateral right eighth rib. There is calcification in the wall of the aortic arch. IMPRESSION: COPD. Mild pulmonary interstitial edema or pneumonia greatest on the left with an area of confluent  airspace opacity in the left  upper lobe. Overall there has not been significant interval change since yesterday's study. Aortic atherosclerosis. Electronically Signed   By: David  SwazilandJordan M.D.   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  * Atrial fibrillation with RVR - worsening Initially Heart rate improved with oral Cardizem and metoprolol. Patient was given a dose of IV Lopressor with no improvement. We'll bolus 250 ML normal saline. Continue Cardizem orally. Consult cardiology for further input.  * Acute on chronic respiratory failure due to left upper lobe Pseudomonas pneumonia and COPD exacerbation with sepsis -IV steroids, Antibiotics- Meropenem - Scheduled Nebulizers - Inhalers -Wean O2 as tolerated - Appreciate pulmonary help - BiPAP at night - Changed to meropenem to ceftazidime.  * Accelerated hypertension:  Continue diltiazem and lisinopril, metoprolol. Clonidine and hydralazine added.  * Diabetes: Continue sliding scale insulin with ADA diet.  * Shin ulcer: Cleanse wound to left lower leg with NS and pat gently dry. Santyl ointment to wound bed. Cover with NS moist gauze, 4x4 gauze and kerlix.Change daily  Management plans discussed with the patient and she is in agreement.  CODE STATUS: DO NOT RESUSCITATE   TOTAL CRITICAL CARE TIME TAKING CARE OF THIS PATIENT: 35 minutes.   Milagros LollSudini, Haygen Zebrowski R M.D on 05/14/2016 at 1:29 PM  Between 7am to 6pm - Pager - [phone]  After 6pm go to [email or web address] - password [email or web address]  [facility]  Office  [phone]  CC: Primary care physician; Fidel LevyJames Hawkins Jr, MD  Note: This dictation was prepared with Dragon dictation along with smaller phrase technology. Any transcriptional errors that result from this process are unintentional.

## 2016-05-14 NOTE — Clinical Documentation Improvement (Signed)
Internal Medicine at [facility] and/or Consultants  Please document query responses in the progress notes and discharge summary, not on the CDI BPA form in [facility]. Thank you!  "Atrial fibrillation" is documented in the current medical record and has been treated with IV Cardizem and now IV Amiodarone is recommended  Please document the Type of Atrial Fibrillation:  - Chronic  - Paroxsymal  - Other type  - Unable to clinically determine  Please exercise your independent, professional judgment when responding. A specific answer is not anticipated or expected.   Thank You, Jerral Ralphathy R Dreshon Proffit  RN BSN CCDS [phone] Health Information Management [place]

## 2016-05-14 NOTE — Progress Notes (Signed)
4 L of oxygen. A fib. Pt reported cough was given. FS are stable. High fall risk. HOH. Family at the bedside. Pt reported heartburn and received maalox. Pt has no further concerns at this time. Dressing to L leg changed.

## 2016-05-14 NOTE — Progress Notes (Addendum)
ANTICOAGULATION CONSULT NOTE -follow up Consult  Pharmacy Consult for Coumadin Indication: atrial fibrillation  Allergies  Allergen Reactions  . Penicillins Rash    Patient Measurements: Height: 5\' 2"  (157.5 cm) Weight: 157 lb 10.1 oz (71.5 kg) IBW/kg (Calculated) : 50.1  Vital Signs: Temp: 97.7 F (36.5 C) (07/03 1108) Temp Source: Oral (07/03 1108) BP: 127/98 mmHg (07/03 1112) Pulse Rate: 111 (07/03 1112)  Labs:  Recent Labs  05/12/16 0441 05/13/16 0417 05/14/16 0313  HGB 11.2* 11.3* 11.3*  HCT 33.8* 33.9* 33.9*  PLT 245 240 223  LABPROT 33.6* 22.1* 18.7*  INR 3.40 1.95 1.56  CREATININE 0.82 0.84 0.97    Estimated Creatinine Clearance: 49.3 mL/min (by C-G formula based on Cr of 0.97).   Medical History: Past Medical History  Diagnosis Date  . COPD (chronic obstructive pulmonary disease) (HCC) Jan 2014  . A-fib (HCC)   . Coronary artery disease     Medications:  Prescriptions prior to admission  Medication Sig Dispense Refill Last Dose  . albuterol (PROVENTIL) (2.5 MG/3ML) 0.083% nebulizer solution Take 2.5 mg by nebulization every 6 (six) hours as needed for wheezing.   prn at prn  . budesonide-formoterol (SYMBICORT) 160-4.5 MCG/ACT inhaler Inhale 2 puffs into the lungs 2 (two) times daily.    05/09/2016 at Unknown time  . collagenase (SANTYL) ointment Apply 1 application topically daily.   05/09/2016 at Unknown time  . diltiazem (CARDIZEM CD) 180 MG 24 hr capsule Take 1 capsule (180 mg total) by mouth daily. 90 capsule 3 05/09/2016 at Unknown time  . furosemide (LASIX) 20 MG tablet Take 20 mg by mouth daily.   05/09/2016 at Unknown time  . glucose blood (ACCU-CHEK AVIVA PLUS) test strip Check sugar 1 to 2 times a day. Dx: E11.9 100 each 11   . lisinopril (PRINIVIL,ZESTRIL) 40 MG tablet Take 1 tablet (40 mg total) by mouth daily. 90 tablet 3 05/09/2016 at Unknown time  . magic mouthwash SOLN Take 5 mLs by mouth 3 (three) times daily as needed for mouth pain. 100  mL 0 prn at prn  . metFORMIN (GLUCOPHAGE) 500 MG tablet Take 1 tablet (500 mg total) by mouth daily. 90 tablet 3 05/09/2016 at Unknown time  . metoprolol (LOPRESSOR) 100 MG tablet Take 1 tablet (100 mg total) by mouth 2 (two) times daily. 180 tablet 3 05/09/2016 at Unknown time  . ofloxacin (OCUFLOX) 0.3 % ophthalmic solution Place 5 drops into both ears as needed.  0 prn at prn  . OXYGEN-HELIUM IN Inhale 2 L into the lungs continuous. 2 liters of oxygen.   Taking  . potassium chloride SA (K-DUR,KLOR-CON) 20 MEQ tablet Take 20 mEq by mouth 2 (two) times daily.    05/09/2016 at Unknown time  . pravastatin (PRAVACHOL) 80 MG tablet Take 80 mg by mouth daily.   05/09/2016 at Unknown time  . predniSONE (DELTASONE) 10 MG tablet Take 10-40 mg by mouth as directed. Take 40mg  daily for 2 days, 30mg  daily for 2 days, 20mg  daily for 2 days, 10mg  daily for 2 days  0 05/09/2016 at Unknown time  . tiotropium (SPIRIVA) 18 MCG inhalation capsule Place 18 mcg into inhaler and inhale daily.    05/09/2016 at Unknown time  . warfarin (COUMADIN) 3 MG tablet Take 3 mg by mouth See admin instructions. Take 3mg  daily on Tuesday, Wednesday, Friday, Saturday, Sunday   05/09/2016 at Unknown time   Scheduled:  . antiseptic oral rinse  7 mL Mouth Rinse q12n4p  .  cefTAZidime (FORTAZ)  IV  2 g Intravenous Q8H  . chlorhexidine  15 mL Mouth Rinse BID  . cloNIDine  0.1 mg Oral TID  . collagenase   Topical Daily  . docusate sodium  100 mg Oral BID  . furosemide  20 mg Oral Daily  . hydrALAZINE  25 mg Oral Q8H  . insulin aspart  0-20 Units Subcutaneous TID WC  . insulin aspart  0-5 Units Subcutaneous QHS  . insulin glargine  7 Units Subcutaneous QHS  . ipratropium-albuterol  3 mL Nebulization Q4H  . methylPREDNISolone (SOLU-MEDROL) injection  60 mg Intravenous Q8H  . metoprolol  100 mg Oral BID  . mometasone-formoterol  2 puff Inhalation BID  . pravastatin  80 mg Oral Daily  . tiotropium  18 mcg Inhalation Daily  . warfarin  2 mg  Oral q1800  . Warfarin - Pharmacist Dosing Inpatient   Does not apply q1800   Infusions:     Assessment:  Pharmacy consulted for warfarin dosing for 70 yo female with history of atrial fibrillation, goal INR 2-3. Patient is on antibiotics for CAP.  On Warfarin 3mg  Tuesday, Wed, Fri, Sat, Sun at home.  6/30 INR 4.31  Hold dose 7/1   INR 3.40  Hold dose 7/2   INR 1.95 Dose not given (not documented on MAR) 7/3 INR 1.56 warfarin 4mg    Goal of Therapy:  INR 2-3   Plan :  INR today subtherapeutic. Warfarin 2mg  dose ordered last night but not given. Will order warfarin 4 mg now. And continue with warfarin 2 min tomorrow.    Will monitor INR with AM labs.   Cher NakaiSheema Joany Khatib, PharmD Clinical Pharmacist 05/14/2016 11:22 AM

## 2016-05-14 NOTE — Progress Notes (Signed)
MD Sudini was made aware of HR 140 afib. MD will place orders.

## 2016-05-15 ENCOUNTER — Inpatient Hospital Stay: Admit: 2016-05-15 | Payer: Medicare Other

## 2016-05-15 ENCOUNTER — Inpatient Hospital Stay
Admit: 2016-05-15 | Discharge: 2016-05-15 | Disposition: A | Discharge status: Home / Self Care | Payer: Medicare Other | Attending: Cardiovascular Disease | Admitting: Cardiovascular Disease

## 2016-05-15 DIAGNOSIS — Z66 Do not resuscitate: Secondary | ICD-10-CM | POA: Diagnosis not present

## 2016-05-15 DIAGNOSIS — J9621 Acute and chronic respiratory failure with hypoxia: Secondary | ICD-10-CM

## 2016-05-15 DIAGNOSIS — R0600 Dyspnea, unspecified: Secondary | ICD-10-CM | POA: Insufficient documentation

## 2016-05-15 DIAGNOSIS — Z515 Encounter for palliative care: Secondary | ICD-10-CM

## 2016-05-15 DIAGNOSIS — J449 Chronic obstructive pulmonary disease, unspecified: Secondary | ICD-10-CM

## 2016-05-15 LAB — CULTURE, BLOOD (ROUTINE X 2)
CULTURE: NO GROWTH
Culture: NO GROWTH

## 2016-05-15 LAB — ECHOCARDIOGRAM COMPLETE
AV Peak grad: 4 mmHg
AV pk vel: 100 cm/s
Ao-asc: 34 cm
FS: 33 % (ref 28–44)
Height: 62 in
IVS/LV PW RATIO, ED: 1.05
LA ID, A-P, ES: 49 mm
LA diam end sys: 49 mm
LA diam index: 2.83 cm/m2
LA vol A4C: 98.5 ml
LA vol index: 56 mL/m2
LA vol: 96.8 mL
LV PW d: 12.9 mm — AB (ref 0.6–1.1)
LVOT area: 2.54 cm2
LVOT diameter: 18 mm
Mean grad: 421 mmHg
Weight: 2522.06 oz

## 2016-05-15 LAB — GLUCOSE, CAPILLARY
GLUCOSE-CAPILLARY: 149 mg/dL — AB (ref 65–99)
GLUCOSE-CAPILLARY: 199 mg/dL — AB (ref 65–99)
GLUCOSE-CAPILLARY: 151 mg/dL — AB (ref 65–99)
Glucose-Capillary: 132 mg/dL — ABNORMAL HIGH (ref 65–99)

## 2016-05-15 LAB — PROTIME-INR
INR: 1.36
Prothrombin Time: 16.9 seconds — ABNORMAL HIGH (ref 11.4–15.0)

## 2016-05-15 IMAGING — CR DG CHEST 2V
2 series · 2 of 2 positions shown · non-contrast
Comparison: None.

CLINICAL DATA: Productive cough and weakness

EXAM:
CHEST  2 VIEW

[chest lat]
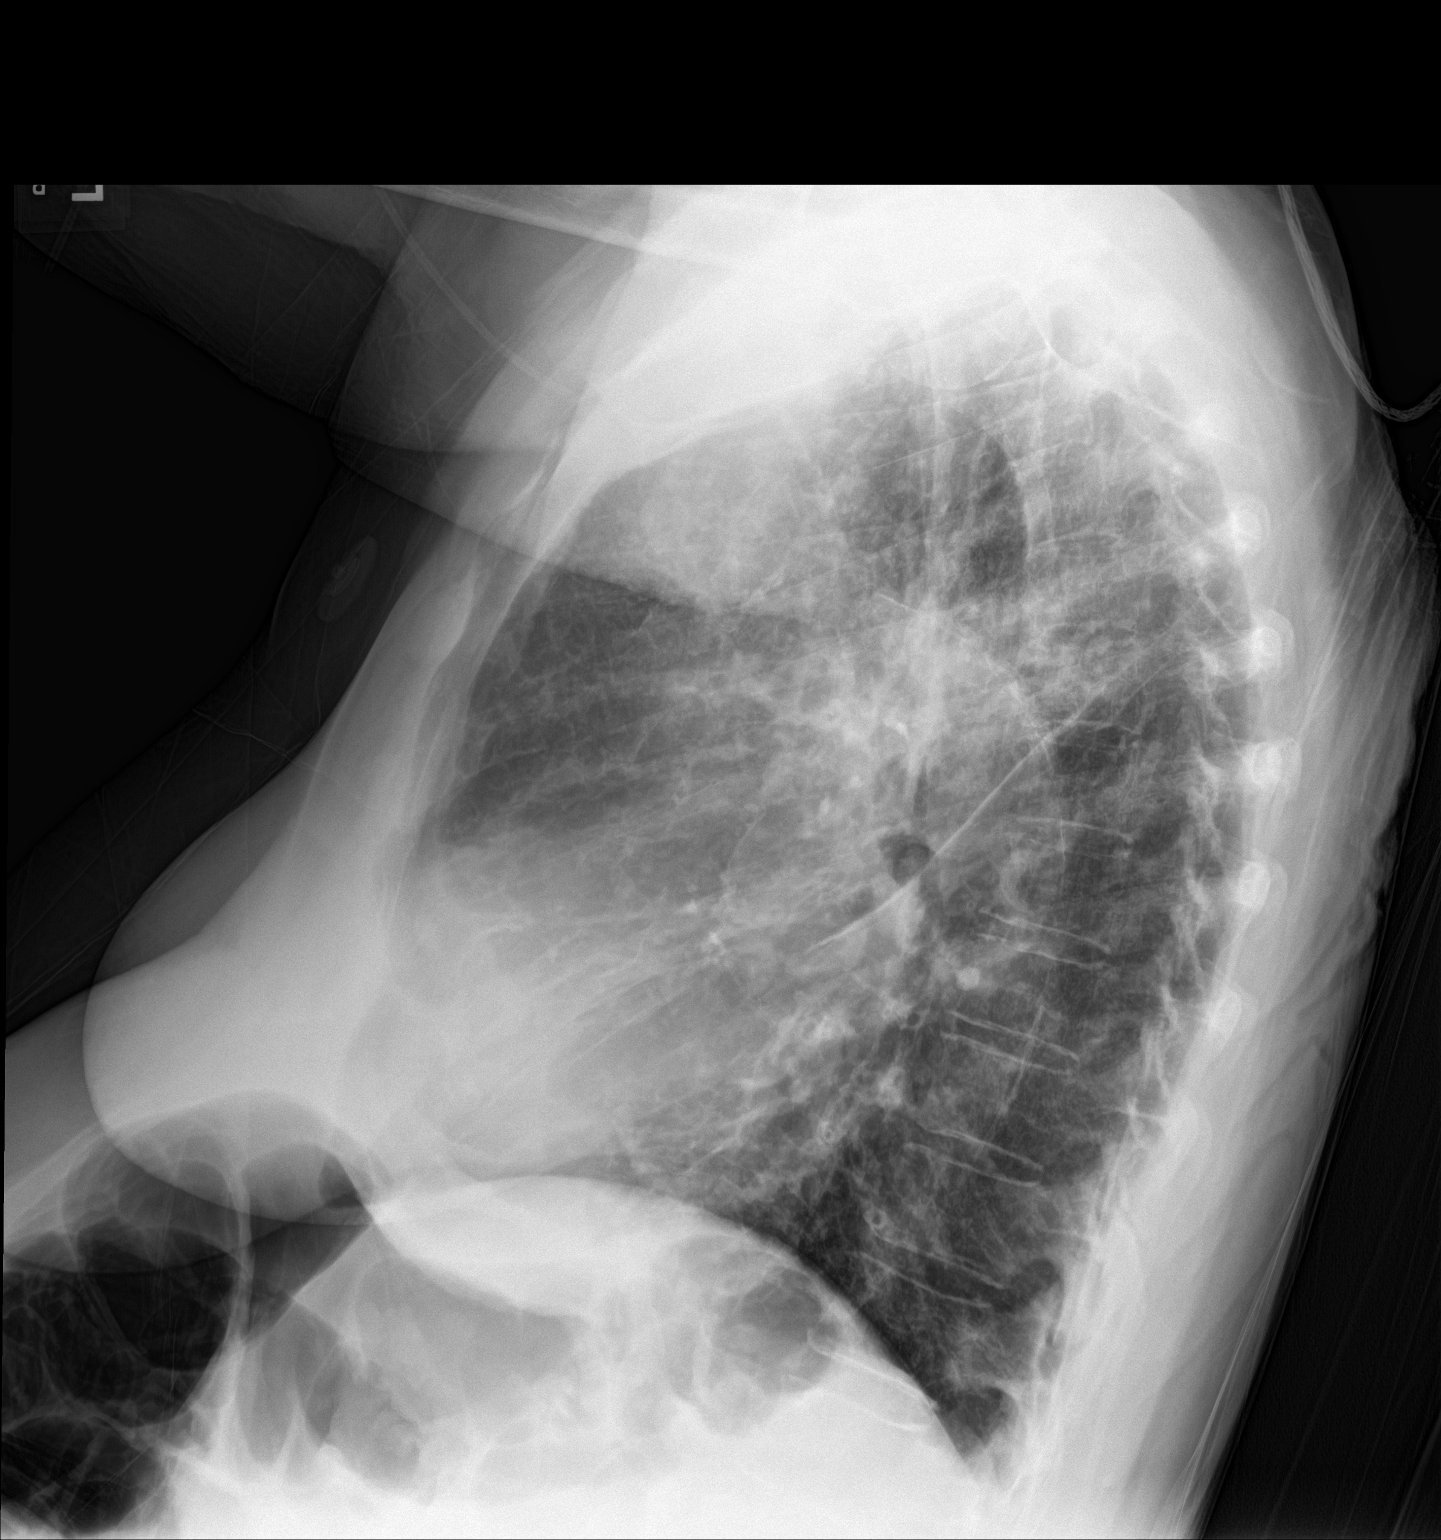

[chest ap]
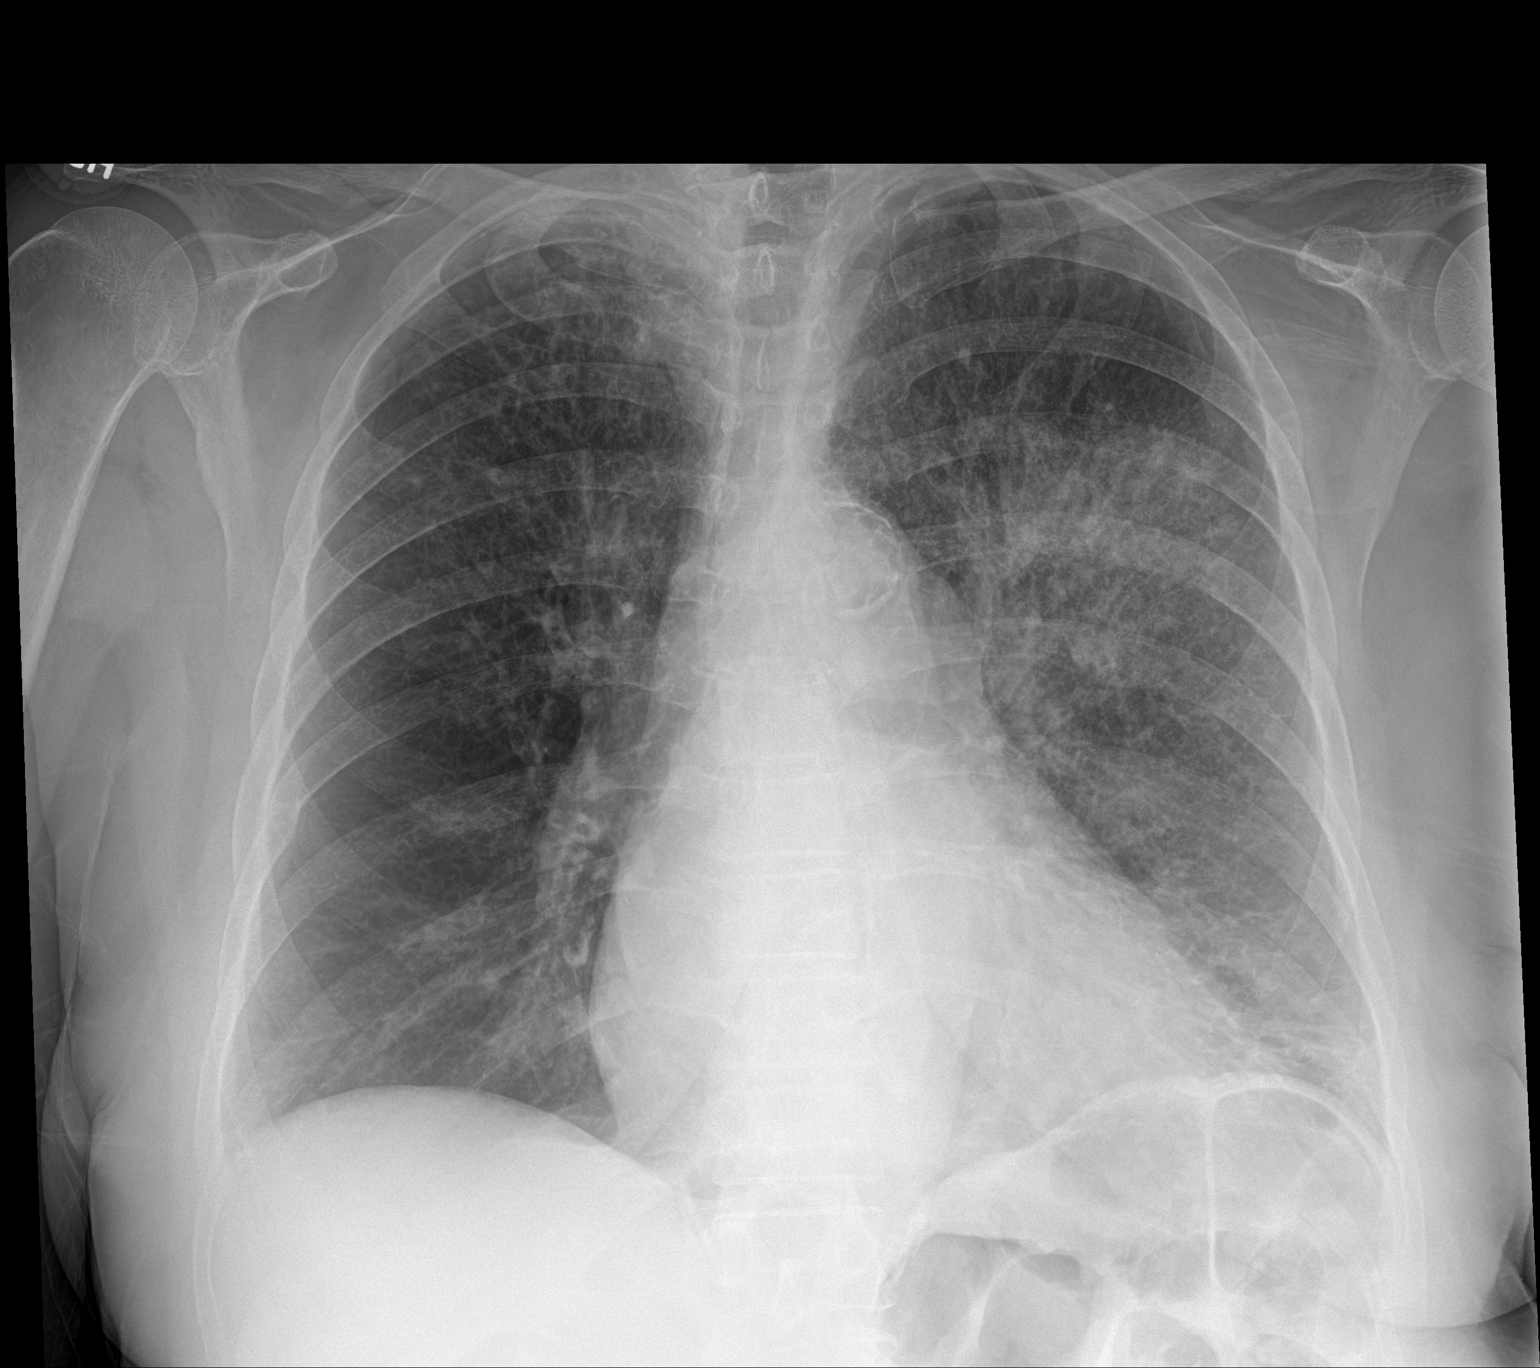

[2 of 2 positions shown; findings below may reference images not displayed]

FINDINGS: Normal cardiac silhouette. There is segmental airspace disease in
the LEFT upper lobe and to lesser degree the LEFT lower lobe which
is new from prior. RIGHT lung is clear. There is bronchiectasis in
lower lobes similar to comparison exam.
IMPRESSION: Findings most suggestive of LEFT upper lobe pneumonia.

Bibasilar bronchiectasis.

## 2016-05-15 MED ORDER — ALPRAZOLAM 0.25 MG PO TABS: 0.5000 mg | ORAL_TABLET | Freq: Three times a day (TID) | ORAL | Status: DC | PRN

## 2016-05-15 MED ORDER — IPRATROPIUM-ALBUTEROL 0.5-2.5 (3) MG/3ML IN SOLN
3.0000 mL | RESPIRATORY_TRACT | Status: DC | PRN
  Administered 2016-05-15: 3 mL via RESPIRATORY_TRACT

## 2016-05-15 MED ORDER — PREDNISONE 50 MG PO TABS
50.0000 mg | ORAL_TABLET | Freq: Every day | ORAL | Status: DC
  Administered 2016-05-16: 50 mg via ORAL
  Filled 2016-05-15: qty 1

## 2016-05-15 MED ORDER — WARFARIN SODIUM 5 MG PO TABS
5.0000 mg | ORAL_TABLET | Freq: Every day | ORAL | Status: DC
  Administered 2016-05-15: 5 mg via ORAL
  Filled 2016-05-15: qty 1

## 2016-05-15 NOTE — Consult Note (Signed)
Consultation Note Date: 05/15/2016   Patient Name: [name]  DOB: May 04, 1946  MRN: [id number]  Age / Sex: 70 y.o., female  PCP: Arlis Porta., MD Referring Physician: Hillary Bow, MD  Reason for Consultation: Establishing goals of care  HPI/Patient Profile: 70 y.o. female  with past medical history of COPD (home oxygen 3L), atrial fibrillation (on Coumadin), CAD, hypertension, diabetes, left shin ulcer admitted on 05/10/2016 with worsening SOB and found to have left upper lobe community acquired pneumonia. Has been treated with BiPAP qhs, antibiotics, nebs, and steroids with minimal improvement.   Clinical Assessment and Goals of Care: I met today with Ms. Venhuizen along with her sister Diane at bedside. Ms. Cottrell is in slight distress with occasional severe coughing spells with thick productive mucous. The coughing seems to be triggering her nausea from my assessment. We discuss her worsening lung disease and Ms. Benham is very realistic and is fully aware of her worsening and also admits that she believes her time is short. Has been having worsening SOB at home and even going to the bathroom takes longer and longer to recover. Diane says that she as well as the rest of the family have noticed the decline.   He main goals are to stay alert and be present to converse with her son that is on his way from [place]. She then would like to proceed with transition to home with hospice (sister confirms that they have much support to help at home) and focus more on comfort. She also would like to "get my affairs in order" although admits if this doesn't happen she is fine. She is very accepting that she is close to end of life and she admits "dying is part of life." She has no fears or concerns about dying. Diane says that most of the family is aware and they are all supportive of Ms. Kreitzer.   We did complete MOST form:  DNR confirmed, comfort measures (with no return to the hospital), determine the use/benefit of antibiotics, no IVF, no feeding tube.   Therapeutic listening and emotional support provided. We will continue to follow for support as she transitions to more comfort care.   OTHER no formal HCPOA and legally defaults to her 3 sons but she wishes for her sisters Shauna Hugh and Vaughan Basta to be her surrogate decision makers. Consider completing formal HCPOA. Although seems family is in agreement and we did complete MOST to protect her wishes.     Code Status/Advance Care Planning:  DNR   Symptom Management:   Cough: Continue Tessalon pearls. Continue guaifenesin-codeine prn. Recommend guaifenesin scheduled but she does not wish for more po meds to take so continue prn for now.   Dyspnea/SOB: Recommend low dose roxanol 5 mg every 2 hours prn. This can be managed/titrated by hospice at home.  Nausea: Continue IV Zofran. May utilize ODT at home prn. Phenergan prn if not relieved by Zofran. Manage cough.   Palliative Prophylaxis:   Frequent Pain Assessment  Additional Recommendations (Limitations,  Scope, Preferences):  Transition to full comfort care when she feels she is ready (after she speaks with son arriving from [place]).   Psycho-social/Spiritual:   Desire for further Chaplaincy support:no  Additional Recommendations: Caregiving  Support/Resources, Education on Hospice, Funeral Planning/Counseling and Grief/Bereavement Support  Prognosis:   Could be weeks to months with worsening COPD and functional status.   Discharge Planning: Home with Hospice      Primary Diagnoses: Present on Admission:  . Sepsis ([place])  I have reviewed the medical record, interviewed the patient and family, and examined the patient. The following aspects are pertinent.  Past Medical History  Diagnosis Date  . COPD (chronic obstructive pulmonary disease) ([place]) Jan 2014  . A-fib ([place])   . Coronary artery  disease    Social History   Social History  . Marital Status: Divorced    Spouse Name: N/A  . Number of Children: N/A  . Years of Education: N/A   Social History Main Topics  . Smoking status: Former Smoker -- 2.00 packs/day for 40 years    Types: Cigarettes  . Smokeless tobacco: Never Used  . Alcohol Use: No  . Drug Use: No  . Sexual Activity: Not Asked   Other Topics Concern  . None   Social History Narrative   Family History  Problem Relation Age of Onset  . Heart failure Father   . Stroke Mother   . Cancer Sister 32    breast   Scheduled Meds: . antiseptic oral rinse  7 mL Mouth Rinse q12n4p  . chlorhexidine  15 mL Mouth Rinse BID  . ciprofloxacin  500 mg Oral BID  . cloNIDine  0.1 mg Oral TID  . collagenase   Topical Daily  . docusate sodium  100 mg Oral BID  . furosemide  20 mg Oral Daily  . hydrALAZINE  25 mg Oral Q8H  . insulin aspart  0-20 Units Subcutaneous TID WC  . insulin aspart  0-5 Units Subcutaneous QHS  . insulin glargine  7 Units Subcutaneous QHS  . ipratropium-albuterol  3 mL Nebulization QID  . methylPREDNISolone (SOLU-MEDROL) injection  40 mg Intravenous Q12H  . metoprolol  100 mg Oral BID  . mometasone-formoterol  2 puff Inhalation BID  . pravastatin  80 mg Oral Daily  . tiotropium  18 mcg Inhalation Daily  . warfarin  5 mg Oral q1800  . Warfarin - Pharmacist Dosing Inpatient   Does not apply q1800   Continuous Infusions: . diltiazem (CARDIZEM) infusion 7.5 mg/hr (05/14/16 2317)   PRN Meds:.acetaminophen **OR** acetaminophen, ALPRAZolam, alum & mag hydroxide-simeth, benzonatate, diltiazem, guaiFENesin-codeine, ipratropium-albuterol, magnesium hydroxide, metoprolol, ondansetron **OR** ondansetron (ZOFRAN) IV, promethazine Medications Prior to Admission:  Prior to Admission medications   Medication Sig Start Date End Date Taking? Authorizing Provider  albuterol (PROVENTIL) (2.5 MG/3ML) 0.083% nebulizer solution Take 2.5 mg by nebulization  every 6 (six) hours as needed for wheezing.   Yes Historical Provider, MD  budesonide-formoterol (SYMBICORT) 160-4.5 MCG/ACT inhaler Inhale 2 puffs into the lungs 2 (two) times daily.  12/13/14  Yes Historical Provider, MD  collagenase (SANTYL) ointment Apply 1 application topically daily.   Yes Historical Provider, MD  diltiazem (CARDIZEM CD) 180 MG 24 hr capsule Take 1 capsule (180 mg total) by mouth daily. 03/01/16  Yes Arlis Porta., MD  furosemide (LASIX) 20 MG tablet Take 20 mg by mouth daily.   Yes Historical Provider, MD  glucose blood (ACCU-CHEK AVIVA PLUS) test strip Check sugar 1 to 2  times a day. Dx: E11.9 05/01/16  Yes Arlis Porta., MD  lisinopril (PRINIVIL,ZESTRIL) 40 MG tablet Take 1 tablet (40 mg total) by mouth daily. 09/02/15  Yes Arlis Porta., MD  magic mouthwash SOLN Take 5 mLs by mouth 3 (three) times daily as needed for mouth pain. 04/04/16  Yes Amy Overton Mam, NP  metFORMIN (GLUCOPHAGE) 500 MG tablet Take 1 tablet (500 mg total) by mouth daily. 03/01/16  Yes Arlis Porta., MD  metoprolol (LOPRESSOR) 100 MG tablet Take 1 tablet (100 mg total) by mouth 2 (two) times daily. 02/28/16  Yes Arlis Porta., MD  ofloxacin (OCUFLOX) 0.3 % ophthalmic solution Place 5 drops into both ears as needed. 09/08/15  Yes Historical Provider, MD  OXYGEN-HELIUM IN Inhale 2 L into the lungs continuous. 2 liters of oxygen.   Yes Historical Provider, MD  potassium chloride SA (K-DUR,KLOR-CON) 20 MEQ tablet Take 20 mEq by mouth 2 (two) times daily.  03/26/13  Yes Historical Provider, MD  pravastatin (PRAVACHOL) 80 MG tablet Take 80 mg by mouth daily.   Yes Historical Provider, MD  predniSONE (DELTASONE) 10 MG tablet Take 10-40 mg by mouth as directed. Take 14m daily for 2 days, 387mdaily for 2 days, 2014maily for 2 days, 82m56mily for 2 days 05/03/16  Yes Historical Provider, MD  tiotropium (SPIRIVA) 18 MCG inhalation capsule Place 18 mcg into inhaler and inhale daily.   12/21/13  Yes Historical Provider, MD  warfarin (COUMADIN) 3 MG tablet Take 3 mg by mouth See admin instructions. Take 3mg 38mly on Tuesday, Wednesday, Friday, Saturday, Sunday   Yes Historical Provider, MD   Allergies  Allergen Reactions  . Penicillins Rash   Review of Systems  Constitutional: Positive for activity change.  Respiratory: Positive for cough, chest tightness and shortness of breath.     Physical Exam  Constitutional: She is oriented to person, place, and time. She appears well-developed and well-nourished.  HENT:  Head: Normocephalic and atraumatic.  Cardiovascular: An irregularly irregular rhythm present. Tachycardia present.   AF  Pulmonary/Chest: No accessory muscle usage. No tachypnea.  Mild-mod distress especially when coughing. Purse lipped breathing. Tight  Abdominal: Soft. Normal appearance.  Neurological: She is alert and oriented to person, place, and time.  Nursing note and vitals reviewed.   Vital Signs: BP 137/97 mmHg  Pulse 94  Temp(Src) 98.7 F (37.1 C) (Oral)  Resp 16  Ht 5' 2"  (1.575 m)  Wt 71.5 kg (157 lb 10.1 oz)  BMI 28.82 kg/m2  SpO2 89% Pain Assessment: No/denies pain   Pain Score: 0-No pain   SpO2: SpO2: (!) 89 % O2 Device:SpO2: (!) 89 % O2 Flow Rate: .O2 Flow Rate (L/min): 4 L/min  IO: Intake/output summary:  Intake/Output Summary (Last 24 hours) at 05/15/16 1000 Last data filed at 05/15/16 0937  Gross per 24 hour  Intake      0 ml  Output    800 ml  Net   -800 ml    LBM: Last BM Date: 05/14/16 Baseline Weight: Weight: 70.308 kg (155 lb) Most recent weight: Weight: 71.5 kg (157 lb 10.1 oz)     Palliative Assessment/Data: PPS: 40%     Time In: 0845 Time Out: 1030 Time Total: 105min7mater than 50%  of this time was spent counseling and coordinating care related to the above assessment and plan.  Signed by: ParkerPershing Proud Please contact Palliative Medicine Team phone at [phone]  questions and  concerns.  For individual provider: See Shea Evans

## 2016-05-15 NOTE — Progress Notes (Signed)
*  PRELIMINARY RESULTS* °Echocardiogram °2D Echocardiogram has been performed. ° °[name] °05/15/2016, 12:10 PM °

## 2016-05-15 NOTE — Progress Notes (Signed)
ANTICOAGULATION CONSULT NOTE -follow up Consult  Pharmacy Consult for Coumadin Indication: atrial fibrillation  Allergies  Allergen Reactions  . Penicillins Rash    Patient Measurements: Height: 5\' 2"  (157.5 cm) Weight: 157 lb 10.1 oz (71.5 kg) IBW/kg (Calculated) : 50.1  Vital Signs: Temp: 98.7 F (37.1 C) (07/04 0424) Temp Source: Oral (07/04 0424) BP: 137/97 mmHg (07/04 0641) Pulse Rate: 94 (07/04 0641)  Labs:  Recent Labs  05/13/16 0417 05/14/16 0313 05/15/16 0413  HGB 11.3* 11.3*  --   HCT 33.9* 33.9*  --   PLT 240 223  --   LABPROT 22.1* 18.7* 16.9*  INR 1.95 1.56 1.36  CREATININE 0.84 0.97  --     Estimated Creatinine Clearance: 49.3 mL/min (by C-G formula based on Cr of 0.97).   Medical History: Past Medical History  Diagnosis Date  . COPD (chronic obstructive pulmonary disease) (HCC) Jan 2014  . A-fib (HCC)   . Coronary artery disease     Medications:  Prescriptions prior to admission  Medication Sig Dispense Refill Last Dose  . albuterol (PROVENTIL) (2.5 MG/3ML) 0.083% nebulizer solution Take 2.5 mg by nebulization every 6 (six) hours as needed for wheezing.   prn at prn  . budesonide-formoterol (SYMBICORT) 160-4.5 MCG/ACT inhaler Inhale 2 puffs into the lungs 2 (two) times daily.    05/09/2016 at Unknown time  . collagenase (SANTYL) ointment Apply 1 application topically daily.   05/09/2016 at Unknown time  . diltiazem (CARDIZEM CD) 180 MG 24 hr capsule Take 1 capsule (180 mg total) by mouth daily. 90 capsule 3 05/09/2016 at Unknown time  . furosemide (LASIX) 20 MG tablet Take 20 mg by mouth daily.   05/09/2016 at Unknown time  . glucose blood (ACCU-CHEK AVIVA PLUS) test strip Check sugar 1 to 2 times a day. Dx: E11.9 100 each 11   . lisinopril (PRINIVIL,ZESTRIL) 40 MG tablet Take 1 tablet (40 mg total) by mouth daily. 90 tablet 3 05/09/2016 at Unknown time  . magic mouthwash SOLN Take 5 mLs by mouth 3 (three) times daily as needed for mouth pain. 100  mL 0 prn at prn  . metFORMIN (GLUCOPHAGE) 500 MG tablet Take 1 tablet (500 mg total) by mouth daily. 90 tablet 3 05/09/2016 at Unknown time  . metoprolol (LOPRESSOR) 100 MG tablet Take 1 tablet (100 mg total) by mouth 2 (two) times daily. 180 tablet 3 05/09/2016 at Unknown time  . ofloxacin (OCUFLOX) 0.3 % ophthalmic solution Place 5 drops into both ears as needed.  0 prn at prn  . OXYGEN-HELIUM IN Inhale 2 L into the lungs continuous. 2 liters of oxygen.   Taking  . potassium chloride SA (K-DUR,KLOR-CON) 20 MEQ tablet Take 20 mEq by mouth 2 (two) times daily.    05/09/2016 at Unknown time  . pravastatin (PRAVACHOL) 80 MG tablet Take 80 mg by mouth daily.   05/09/2016 at Unknown time  . predniSONE (DELTASONE) 10 MG tablet Take 10-40 mg by mouth as directed. Take 40mg  daily for 2 days, 30mg  daily for 2 days, 20mg  daily for 2 days, 10mg  daily for 2 days  0 05/09/2016 at Unknown time  . tiotropium (SPIRIVA) 18 MCG inhalation capsule Place 18 mcg into inhaler and inhale daily.    05/09/2016 at Unknown time  . warfarin (COUMADIN) 3 MG tablet Take 3 mg by mouth See admin instructions. Take 3mg  daily on Tuesday, Wednesday, Friday, Saturday, Sunday   05/09/2016 at Unknown time   Scheduled:  . antiseptic oral rinse  7 mL Mouth Rinse q12n4p  . chlorhexidine  15 mL Mouth Rinse BID  . ciprofloxacin  500 mg Oral BID  . cloNIDine  0.1 mg Oral TID  . collagenase   Topical Daily  . docusate sodium  100 mg Oral BID  . furosemide  20 mg Oral Daily  . hydrALAZINE  25 mg Oral Q8H  . insulin aspart  0-20 Units Subcutaneous TID WC  . insulin aspart  0-5 Units Subcutaneous QHS  . insulin glargine  7 Units Subcutaneous QHS  . ipratropium-albuterol  3 mL Nebulization QID  . methylPREDNISolone (SOLU-MEDROL) injection  40 mg Intravenous Q12H  . metoprolol  100 mg Oral BID  . mometasone-formoterol  2 puff Inhalation BID  . pravastatin  80 mg Oral Daily  . tiotropium  18 mcg Inhalation Daily  . Warfarin - Pharmacist Dosing  Inpatient   Does not apply q1800   Infusions:  . diltiazem (CARDIZEM) infusion 7.5 mg/hr (05/14/16 2317)    Assessment:  Pharmacy consulted for warfarin dosing for 70 yo female with history of atrial fibrillation, goal INR 2-3. Patient is on antibiotics for CAP.  On Warfarin 3mg  Tuesday, Wed, Fri, Sat, Sun at home.  6/30 INR 4.31  Hold dose 7/1   INR 3.40  Hold dose 7/2   INR 1.95 Dose not given (not documented on MAR) 7/3   INR 1.56 warfarin 4mg  given. 7/4   INR 1.36   Goal of Therapy:  INR 2-3   Plan :  INR today subtherapeutic. Will give warfarin 5mg  tonight.   Will order INR with AM labs.   Stormy CardKatsoudas,Olusegun Gerstenberger K, [facility]PH Clinical Pharmacist 05/15/2016, 7:48 AM

## 2016-05-15 NOTE — Care Management (Signed)
Palliative Care consult.Patient waiting to speak with son to transition to full comfort care. [place]  Very short of breath. remains in afib. cardizem gtt.

## 2016-05-15 NOTE — Progress Notes (Signed)
[facility] [place] [facility]   PATIENT NAME: [name]    MR#:  [id number]  DATE OF BIRTH:  [birth year]  SUBJECTIVE:   Continues to be SOB.  REVIEW OF SYSTEMS:    Review of Systems  Constitutional: Positive for malaise/fatigue. Negative for fever and chills.  HENT: Negative for ear discharge, ear pain, hearing loss, nosebleeds and sore throat.   Eyes: Negative for blurred vision and pain.  Respiratory: Positive for cough, shortness of breath and wheezing. Negative for hemoptysis.   Cardiovascular: Positive for palpitations. Negative for chest pain and leg swelling.  Gastrointestinal: Negative for nausea, vomiting, abdominal pain, diarrhea and blood in stool.  Genitourinary: Negative for dysuria.  Musculoskeletal: Negative for back pain.  Neurological: Positive for weakness. Negative for dizziness, tremors, speech change, focal weakness, seizures and headaches.  Endo/Heme/Allergies: Does not bruise/bleed easily.  Psychiatric/Behavioral: Negative for depression, suicidal ideas and hallucinations.   DRUG ALLERGIES:   Allergies  Allergen Reactions  . Penicillins Rash   VITALS:  Blood pressure 151/63, pulse 99, temperature 98 F (36.7 C), temperature source Oral, resp. rate 16, height 5\' 2"  (1.575 m), weight 71.5 kg (157 lb 10.1 oz), SpO2 90 %.  PHYSICAL EXAMINATION:   Physical Exam  Constitutional: She is oriented to person, place, and time. She appears distressed.  Increased work of breathing  HENT:  Head: Normocephalic.  Eyes: No scleral icterus.  Neck: Normal range of motion. Neck supple. No JVD present. No tracheal deviation present.  Cardiovascular: Normal rate, regular rhythm and normal heart sounds.  Exam reveals no gallop and no friction rub.   No murmur heard. Tachycardia with irregular irregular heartbeat  Pulmonary/Chest: No respiratory distress. She has wheezes. She has no rales. She exhibits no tenderness.  Wheezing bilaterally lungs are  tight  Abdominal: Soft. Bowel sounds are normal. She exhibits no distension and no mass. There is no tenderness. There is no rebound and no guarding.  Musculoskeletal: Normal range of motion. She exhibits no edema.  Neurological: She is alert and oriented to person, place, and time.  Skin: Skin is warm. No rash noted. No erythema.  Psychiatric: Affect and judgment normal.   LABORATORY PANEL:   CBC  Recent Labs Lab 05/14/16 0313  WBC 17.4*  HGB 11.3*  HCT 33.9*  PLT 223   ------------------------------------------------------------------------------------------------------------------  Chemistries   Recent Labs Lab 05/14/16 0313  NA 132*  K 5.4*  CL 95*  CO2 33*  GLUCOSE 230*  BUN 46*  CREATININE 0.97  CALCIUM 8.2*  MG 2.6*   ------------------------------------------------------------------------------------------------------------------  Cardiac Enzymes  Recent Labs Lab 05/10/16 0700  TROPONINI 0.03*   ------------------------------------------------------------------------------------------------------------------  RADIOLOGY:  Dg Chest Port 1 View  05/14/2016  CLINICAL DATA:  Acute respiratory for a year, shortness of breath, history of coronary artery stent, COPD, former smoker. EXAM: PORTABLE CHEST 1 VIEW COMPARISON:  Portable chest x-ray of May 12, 2016 FINDINGS: The lungs are well-expanded. The interstitial markings are increased bilaterally greatest on the left. An area of subtle confluence in the upper lobe is present. The cardiac silhouette is mildly enlarged. The pulmonary vascularity is not engorged. There is no pleural effusion or pneumothorax. There is old deformity of the posterior lateral right eighth rib. There is calcification in the wall of the aortic arch. IMPRESSION: COPD. Mild pulmonary interstitial edema or pneumonia greatest on the left with an area of confluent airspace opacity in the left upper lobe. Overall there has not been significant  interval change since yesterday's study.  Aortic atherosclerosis. Electronically Signed   By: David  SwazilandJordan M.D.   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  * Atrial fibrillation with RVR  Cardizem and metoprolol.  * Acute on chronic respiratory failure due to left upper lobe Pseudomonas pneumonia and COPD exacerbation with sepsis Ciprofloxacin Prednisone Nebs  * Accelerated hypertension:  ON meds  * Diabetes: Continue sliding scale insulin with ADA diet.  * Shin ulcer: Cleanse wound to left lower leg with NS and pat gently dry. Santyl ointment to wound bed. Cover with NS moist gauze, 4x4 gauze and kerlix.Change daily  Management plans discussed with the patient and she is in agreement.  Discussed with palliative care. Plan is to discharge home with hospice tomorrow once son arrives from [place].  CODE STATUS: DO NOT RESUSCITATE   TOTAL  TIME TAKING CARE OF THIS PATIENT: 25 minutes.   Milagros LollSudini, Duke Weisensel R M.D on 05/15/2016 at 1:42 PM  Between 7am to 6pm - Pager - [phone]  After 6pm go to [email or web address] - password [email or web address]  [facility]  Office  [phone]  CC: Primary care physician; Fidel LevyJames Hawkins Jr, MD  Note: This dictation was prepared with Dragon dictation along with smaller phrase technology. Any transcriptional errors that result from this process are unintentional.

## 2016-05-15 NOTE — Progress Notes (Signed)
SUBJECTIVE: No chest pain but very short of breath   Filed Vitals:   05/14/16 1845 05/14/16 1914 05/15/16 0424 05/15/16 0641  BP: 149/85 157/87 177/85 137/97  Pulse: 96 112 111 94  Temp:  98 F (36.7 C) 98.7 F (37.1 C)   TempSrc:  Oral Oral   Resp:  18 16   Height:      Weight:      SpO2:  92% 89%     Intake/Output Summary (Last 24 hours) at 05/15/16 1052 Last data filed at 05/15/16 0937  Gross per 24 hour  Intake      0 ml  Output    800 ml  Net   -800 ml    LABS: Basic Metabolic Panel:  Recent Labs  16/08/9606/02/17 0417 05/14/16 0313  NA 136 132*  K 5.3* 5.4*  CL 102 95*  CO2 30 33*  GLUCOSE 236* 230*  BUN 41* 46*  CREATININE 0.84 0.97  CALCIUM 8.1* 8.2*  MG  --  2.6*  PHOS  --  2.9   Liver Function Tests: No results for input(s): AST, ALT, ALKPHOS, BILITOT, PROT, ALBUMIN in the last 72 hours. No results for input(s): LIPASE, AMYLASE in the last 72 hours. CBC:  Recent Labs  05/13/16 0417 05/14/16 0313  WBC 18.8* 17.4*  HGB 11.3* 11.3*  HCT 33.9* 33.9*  MCV 94.8 95.0  PLT 240 223   Cardiac Enzymes: No results for input(s): CKTOTAL, CKMB, CKMBINDEX, TROPONINI in the last 72 hours. BNP: Invalid input(s): POCBNP D-Dimer: No results for input(s): DDIMER in the last 72 hours. Hemoglobin A1C: No results for input(s): HGBA1C in the last 72 hours. Fasting Lipid Panel: No results for input(s): CHOL, HDL, LDLCALC, TRIG, CHOLHDL, LDLDIRECT in the last 72 hours. Thyroid Function Tests: No results for input(s): TSH, T4TOTAL, T3FREE, THYROIDAB in the last 72 hours.  Invalid input(s): FREET3 Anemia Panel: No results for input(s): VITAMINB12, FOLATE, FERRITIN, TIBC, IRON, RETICCTPCT in the last 72 hours.   PHYSICAL EXAM General: Well developed, well nourished, in no acute distress HEENT:  Normocephalic and atramatic Neck:  No JVD.  Lungs: Clear bilaterally to auscultation and percussion. Heart: HRRR . Normal S1 and S2 without gallops or murmurs.  Abdomen:  Bowel sounds are positive, abdomen soft and non-tender  Msk:  Back normal, normal gait. Normal strength and tone for age. Extremities: No clubbing, cyanosis or edema.   Neuro: Alert and oriented X 3. Psych:  Good affect, responds appropriately  TELEMETRY:A. fib with ventricular rate between 100 and 119  ASSESSMENT AND PLAN: Patient has atrial fibrillation with rapid ventricular response rate on Cardizem drip may increase the Cardizem drip to control ventricular rate between ATN 100. Once the pneumonia is adequately treated ventricular rate will respond properly in the meantime advise continuing IV Cardizem. May change to Cardizem by mouth once rate is well controlled.  Active Problems:   Sepsis (HCC)   Palliative care encounter   DNR (do not resuscitate)   Acute on chronic respiratory failure with hypoxia (HCC)   Dyspnea    Dorrie Cocuzza A, MD, [facility]villeFACC 05/15/2016 10:52 AM

## 2016-05-16 ENCOUNTER — Telehealth: Payer: Self-pay

## 2016-05-16 LAB — GLUCOSE, CAPILLARY
GLUCOSE-CAPILLARY: 111 mg/dL — AB (ref 65–99)
Glucose-Capillary: 85 mg/dL (ref 65–99)

## 2016-05-16 LAB — PROTIME-INR
INR: 1.59
Prothrombin Time: 19 seconds — ABNORMAL HIGH (ref 11.4–15.0)

## 2016-05-16 MED ORDER — DILTIAZEM HCL 60 MG PO TABS
60.0000 mg | ORAL_TABLET | Freq: Four times a day (QID) | ORAL | Status: DC
  Administered 2016-05-16: 60 mg via ORAL
  Filled 2016-05-16: qty 1

## 2016-05-16 MED ORDER — LORAZEPAM 0.5 MG PO TABS: 0.5000 mg | ORAL_TABLET | Freq: Four times a day (QID) | ORAL | Status: AC | PRN

## 2016-05-16 MED ORDER — DILTIAZEM HCL 60 MG PO TABS: 60.0000 mg | ORAL_TABLET | Freq: Four times a day (QID) | ORAL | Status: AC

## 2016-05-16 MED ORDER — MORPHINE SULFATE 20 MG/5ML PO SOLN: 5.0000 mg | ORAL | Status: AC | PRN

## 2016-05-16 MED ORDER — CIPROFLOXACIN HCL 500 MG PO TABS: 500.0000 mg | ORAL_TABLET | Freq: Two times a day (BID) | ORAL | Status: AC

## 2016-05-16 MED ORDER — PREDNISONE 10 MG PO TABS: 10.0000 mg | ORAL_TABLET | ORAL | Status: AC

## 2016-05-16 MED ORDER — WARFARIN SODIUM 1 MG PO TABS: 3.0000 mg | ORAL_TABLET | Freq: Every day | ORAL | Status: DC

## 2016-05-16 NOTE — Care Management (Signed)
Patient's family will transport home and has the portable tank present for transport.

## 2016-05-16 NOTE — Progress Notes (Addendum)
Cardiac monitoring d/c'd & returned to secretary desk per MD order. cardizem gtt stopped also per MD.

## 2016-05-16 NOTE — Telephone Encounter (Signed)
Returned call to Savannah KidneyDebra made her aware Dr. Juanetta GoslingHawkins out of office until 7/11. Gave verbal to Debbie in triage that Dr. Juanetta GoslingHawkins will sign hospice order once he returns.

## 2016-05-16 NOTE — Progress Notes (Signed)
ANTICOAGULATION CONSULT NOTE -follow up Consult  Pharmacy Consult for Coumadin Indication: atrial fibrillation  Allergies  Allergen Reactions  . Penicillins Rash    Patient Measurements: Height: 5\' 2"  (157.5 cm) Weight: 157 lb 10.1 oz (71.5 kg) IBW/kg (Calculated) : 50.1  Vital Signs: Temp: 97.8 F (36.6 C) (07/05 0929) Temp Source: Oral (07/05 0929) BP: 98/55 mmHg (07/05 0944) Pulse Rate: 73 (07/05 0944)  Labs:  Recent Labs  05/14/16 0313 05/15/16 0413 05/16/16 0349  HGB 11.3*  --   --   HCT 33.9*  --   --   PLT 223  --   --   LABPROT 18.7* 16.9* 19.0*  INR 1.56 1.36 1.59  CREATININE 0.97  --   --     Estimated Creatinine Clearance: 49.3 mL/min (by C-G formula based on Cr of 0.97).   Medical History: Past Medical History  Diagnosis Date  . COPD (chronic obstructive pulmonary disease) (HCC) Jan 2014  . A-fib (HCC)   . Coronary artery disease     Medications:  Prescriptions prior to admission  Medication Sig Dispense Refill Last Dose  . albuterol (PROVENTIL) (2.5 MG/3ML) 0.083% nebulizer solution Take 2.5 mg by nebulization every 6 (six) hours as needed for wheezing.   prn at prn  . budesonide-formoterol (SYMBICORT) 160-4.5 MCG/ACT inhaler Inhale 2 puffs into the lungs 2 (two) times daily.    05/09/2016 at Unknown time  . collagenase (SANTYL) ointment Apply 1 application topically daily.   05/09/2016 at Unknown time  . diltiazem (CARDIZEM CD) 180 MG 24 hr capsule Take 1 capsule (180 mg total) by mouth daily. 90 capsule 3 05/09/2016 at Unknown time  . furosemide (LASIX) 20 MG tablet Take 20 mg by mouth daily.   05/09/2016 at Unknown time  . glucose blood (ACCU-CHEK AVIVA PLUS) test strip Check sugar 1 to 2 times a day. Dx: E11.9 100 each 11   . lisinopril (PRINIVIL,ZESTRIL) 40 MG tablet Take 1 tablet (40 mg total) by mouth daily. 90 tablet 3 05/09/2016 at Unknown time  . magic mouthwash SOLN Take 5 mLs by mouth 3 (three) times daily as needed for mouth pain. 100 mL  0 prn at prn  . metFORMIN (GLUCOPHAGE) 500 MG tablet Take 1 tablet (500 mg total) by mouth daily. 90 tablet 3 05/09/2016 at Unknown time  . metoprolol (LOPRESSOR) 100 MG tablet Take 1 tablet (100 mg total) by mouth 2 (two) times daily. 180 tablet 3 05/09/2016 at Unknown time  . ofloxacin (OCUFLOX) 0.3 % ophthalmic solution Place 5 drops into both ears as needed.  0 prn at prn  . OXYGEN-HELIUM IN Inhale 2 L into the lungs continuous. 2 liters of oxygen.   Taking  . potassium chloride SA (K-DUR,KLOR-CON) 20 MEQ tablet Take 20 mEq by mouth 2 (two) times daily.    05/09/2016 at Unknown time  . pravastatin (PRAVACHOL) 80 MG tablet Take 80 mg by mouth daily.   05/09/2016 at Unknown time  . tiotropium (SPIRIVA) 18 MCG inhalation capsule Place 18 mcg into inhaler and inhale daily.    05/09/2016 at Unknown time  . warfarin (COUMADIN) 3 MG tablet Take 3 mg by mouth See admin instructions. Take 3mg  daily on Tuesday, Wednesday, Friday, Saturday, Sunday   05/09/2016 at Unknown time  . [DISCONTINUED] predniSONE (DELTASONE) 10 MG tablet Take 10-40 mg by mouth as directed. Take 40mg  daily for 2 days, 30mg  daily for 2 days, 20mg  daily for 2 days, 10mg  daily for 2 days  0 05/09/2016 at Unknown  time   Scheduled:  . antiseptic oral rinse  7 mL Mouth Rinse q12n4p  . chlorhexidine  15 mL Mouth Rinse BID  . ciprofloxacin  500 mg Oral BID  . collagenase   Topical Daily  . diltiazem  60 mg Oral Q6H  . docusate sodium  100 mg Oral BID  . furosemide  20 mg Oral Daily  . insulin aspart  0-20 Units Subcutaneous TID WC  . insulin aspart  0-5 Units Subcutaneous QHS  . insulin glargine  7 Units Subcutaneous QHS  . metoprolol  100 mg Oral BID  . mometasone-formoterol  2 puff Inhalation BID  . pravastatin  80 mg Oral Daily  . predniSONE  50 mg Oral Q breakfast  . tiotropium  18 mcg Inhalation Daily  . warfarin  5 mg Oral q1800  . Warfarin - Pharmacist Dosing Inpatient   Does not apply q1800   Infusions:  . diltiazem  (CARDIZEM) infusion Stopped (05/16/16 1030)    Assessment:  Pharmacy consulted for warfarin dosing for 70 yo female with history of atrial fibrillation, goal INR 2-3. Patient is on antibiotics for CAP.   On Warfarin 3mg  Tuesday, Wed, Fri, Sat, Sun at home.  6/30 INR 4.31  Hold dose 7/1   INR 3.40  Hold dose 7/2   INR 1.95 Dose not given (not documented on MAR) 7/3   INR 1.56 warfarin 4mg  given. 7/4   INR 1.36 Warfarin 5mg  given  7/5 INR 1.59 warfarin 3mg  given   Goal of Therapy:  INR 2-3   Plan :  INR today subtherapeutic. Expect to see increase in INR tomorrow. Will give patients home dose of  warfarin 3mg  tonight.    Will order INR with AM labs.   Gardner CandleSheema M Gorje Iyer, [facility] HospitalRPH Clinical Pharmacist 05/16/2016, 10:58 AM

## 2016-05-16 NOTE — Care Management Important Message (Signed)
Important Message  Patient Details  Name: [name] MRN: [id number] Date of Birth: 21-Nov-1945   Medicare Important Message Given:  Yes    Olegario MessierKathy A Katalea Ucci 05/16/2016, 11:02 AM

## 2016-05-16 NOTE — Progress Notes (Signed)
New referral for [facility] services at home following discharge. [name] is a 70 year old woman with a known history of COPD, admitted to [facility] on 6/29 for treatment of sepsis related to left upper lobe community acquired PNA. She has required intermittent Bi pap as well as a Cardizem drip for control of afib with RVR. She met with Palliative Care NP on 7/4 and has chosen to return home with hospice services. Writer met in the room with [name], her son, daughter in law, ex husband and several small grand children to initiate education regarding hospice services, philosophy and team approach to care with good understanding voiced. [name] currently is requiring 4 liters of oxygen via nasal cannula, she does have oxygen in place in her home, as well as a hospital bed, BSC, wheel chair and electric scooter and a ramp. She plans to discharge via car, with family today. Hospice information and contact number left with [name] Patient information faxed to referral. CMRN Joni Reining and staff RN Maddie made aware. Thank you for the opportunity to participate.  Flo Shanks RN, BSN, [place] [facility] [place], [facility] [phone] c

## 2016-05-16 NOTE — Progress Notes (Signed)
Pt. Discharged to home with hospice via wc and with home o2 tank. Discharge instructions and medication regimen reviewed at bedside with patient and family. All able to verbalize understanding of instructions and medication regimen. Prescriptions, DNR and MOST form included w d/c papers. Patient assessment unchanged from this morning. IV discontinued per policy.

## 2016-05-16 NOTE — Discharge Instructions (Addendum)
°  DIET:  Regular diet  DISCHARGE CONDITION:  Fair  ACTIVITY:  Activity as tolerated  OXYGEN:  Home Oxygen: Yes.     Oxygen Delivery: 3 liters/min via Patient connected to nasal cannula oxygen  DISCHARGE LOCATION:  home

## 2016-05-16 NOTE — Care Management (Signed)
Informed patient is to discharge home today and has chosen to discharge home with hospice.  [facility]  Notified nurse liaison.  Patient has home 02 ( have asked attending for new script so can be sent to hospice dme company), hospital bed and access to walkers and bsc.

## 2016-05-16 NOTE — Progress Notes (Signed)
SUBJECTIVE: Patient is feeling less short of breath and no chest pain or palpitation   Filed Vitals:   05/15/16 1437 05/15/16 1931 05/15/16 2350 05/16/16 0609  BP: 158/85 124/60 121/82 127/63  Pulse: 88 96 92 78  Temp:  97.4 F (36.3 C) 97.8 F (36.6 C) 98.2 F (36.8 C)  TempSrc:  Oral  Oral  Resp:  16 16 18   Height:      Weight:      SpO2:  95% 99% 91%    Intake/Output Summary (Last 24 hours) at 05/16/16 0844 Last data filed at 05/16/16 0751  Gross per 24 hour  Intake    330 ml  Output   1100 ml  Net   -770 ml    LABS: Basic Metabolic Panel:  Recent Labs  45/40/9806/01/26 0313  NA 132*  K 5.4*  CL 95*  CO2 33*  GLUCOSE 230*  BUN 46*  CREATININE 0.97  CALCIUM 8.2*  MG 2.6*  PHOS 2.9   Liver Function Tests: No results for input(s): AST, ALT, ALKPHOS, BILITOT, PROT, ALBUMIN in the last 72 hours. No results for input(s): LIPASE, AMYLASE in the last 72 hours. CBC:  Recent Labs  05/14/16 0313  WBC 17.4*  HGB 11.3*  HCT 33.9*  MCV 95.0  PLT 223   Cardiac Enzymes: No results for input(s): CKTOTAL, CKMB, CKMBINDEX, TROPONINI in the last 72 hours. BNP: Invalid input(s): POCBNP D-Dimer: No results for input(s): DDIMER in the last 72 hours. Hemoglobin A1C: No results for input(s): HGBA1C in the last 72 hours. Fasting Lipid Panel: No results for input(s): CHOL, HDL, LDLCALC, TRIG, CHOLHDL, LDLDIRECT in the last 72 hours. Thyroid Function Tests: No results for input(s): TSH, T4TOTAL, T3FREE, THYROIDAB in the last 72 hours.  Invalid input(s): FREET3 Anemia Panel: No results for input(s): VITAMINB12, FOLATE, FERRITIN, TIBC, IRON, RETICCTPCT in the last 72 hours.   PHYSICAL EXAM General: Well developed, well nourished, in no acute distress HEENT:  Normocephalic and atramatic Neck:  No JVD.  Lungs: Clear bilaterally to auscultation and percussion. Heart: HRRR . Normal S1 and S2 without gallops or murmurs.  Abdomen: Bowel sounds are positive, abdomen soft and  non-tender  Msk:  Back normal, normal gait. Normal strength and tone for age. Extremities: No clubbing, cyanosis or edema.   Neuro: Alert and oriented X 3. Psych:  Good affect, responds appropriately  TELEMETRY:A. fib with ventricular rate 80/m  ASSESSMENT AND PLAN: Atrial fibrillation with ventricular rate well controlled now. May switch to by mouth Cardizem 30 every 6 and increased to 60 every 6 if needed. Patient has underlying pneumonia which was cause of rapid ventricular response rate last few days.  Active Problems:   Sepsis (HCC)   Palliative care encounter   DNR (do not resuscitate)   Acute on chronic respiratory failure with hypoxia (HCC)   Dyspnea    [name] A, MD, [facility] 05/16/2016 8:44 AM

## 2016-05-16 NOTE — Telephone Encounter (Signed)
Debra from hospice called reporting [name] and going home with hospice services.  They need a verbal that Dr. Juanetta GoslingHawkins will sign orders for patient.  [phone]

## 2016-05-17 NOTE — Discharge Summary (Signed)
[facility] [place] [facility]   PATIENT NAME: [name]    MR#:  [id number]  DATE OF BIRTH:  12-26-1945  DATE OF ADMISSION:  05/10/2016 ADMITTING PHYSICIAN: Adrian SaranSital Mody, MD  DATE OF DISCHARGE: 05/16/2016  3:01 PM  PRIMARY CARE PHYSICIAN: Fidel LevyJames Hawkins Jr, MD   ADMISSION DIAGNOSIS:  CAP (community acquired pneumonia) [J18.9] Atrial fibrillation with rapid ventricular response (HCC) [I48.91] Acute on chronic respiratory failure with hypoxia (HCC) [J96.21] Sepsis, due to unspecified organism (HCC) [A41.9]  DISCHARGE DIAGNOSIS:  Active Problems:   Sepsis (HCC)   Palliative care encounter   DNR (do not resuscitate)   Acute on chronic respiratory failure with hypoxia (HCC)   Dyspnea   SECONDARY DIAGNOSIS:   Past Medical History  Diagnosis Date  . COPD (chronic obstructive pulmonary disease) (HCC) Jan 2014  . A-fib (HCC)   . Coronary artery disease      ADMITTING HISTORY  Savannah Cavada Shuping is a 70 y.o. female with a known history of Stage 4 COPD and Atrial FibrillationWho presents with progressive dyspnea for the past week. On arrival to the emergency room patient's oxygen saturation was 83%. She wears 3 L of oxygen at home. She was also found to have atrial fibrillation with RVR. Chest x-ray is consistent with pneumonia. She has received Levaquin and IV steroids. She is recently saw her pulmonologist and was prescribed prednisone taper which she currently is on. She was placed on BiPAP machine.  HOSPITAL COURSE:   70 year old female with a history of atrial fibrillation on anticoagulation and stage IV COPD on 3 L oxygen who presents with sepsis due to community-acquired pneumonia.  * Atrial fibrillation with RVR  Cardizem and metoprolol.  * Acute on chronic respiratory failure due to left upper lobe Pseudomonas pneumonia and COPD exacerbation with sepsis Ciprofloxacin Prednisone Nebs  * Accelerated hypertension:  On meds  * Diabetes: Continue  sliding scale insulin with ADA diet.  * Shin ulcer: Cleanse wound to left lower leg with NS and pat gently dry. Santyl ointment to wound bed. Cover with NS moist gauze, 4x4 gauze and kerlix.Change daily  Patient was seen by palliative care. After goals of care conization patient has chosen to be discharged home with hospice services. Transition to comfort measures if no improvement. This was discussed with family in detail who agreed.  Patient is DO NOT RESUSCITATE most form requesting transition to comfort measures if any worsening at home.  CONSULTS OBTAINED:  Treatment Team:  Mertie MooresHerbon E Fleming, MD Laurier NancyShaukat A Khan, MD  DRUG ALLERGIES:   Allergies  Allergen Reactions  . Penicillins Rash    DISCHARGE MEDICATIONS:   Discharge Medication List as of 05/16/2016  1:48 PM    START taking these medications   Details  ciprofloxacin (CIPRO) 500 MG tablet Take 1 tablet (500 mg total) by mouth 2 (two) times daily., Starting 05/16/2016, Until Discontinued, Print    diltiazem (CARDIZEM) 60 MG tablet Take 1 tablet (60 mg total) by mouth 4 (four) times daily., Starting 05/16/2016, Until Discontinued, Print    LORazepam (ATIVAN) 0.5 MG tablet Take 1 tablet (0.5 mg total) by mouth every 6 (six) hours as needed for anxiety., Starting 05/16/2016, Until Discontinued, Print    morphine 20 MG/5ML solution Take 1.3 mLs (5.2 mg total) by mouth every 3 (three) hours as needed for pain (Shortness of breath)., Starting 05/16/2016, Until Discontinued, Print      CONTINUE these medications which have CHANGED   Details  predniSONE (DELTASONE) 10  MG tablet Take 1-4 tablets (10-40 mg total) by mouth as directed. Take 40mg  daily for 2 days, 30mg  daily for 2 days, 20mg  daily for 2 days, 10mg  daily for 2 days, Starting 05/16/2016, Until Discontinued, Print      CONTINUE these medications which have NOT CHANGED   Details  albuterol (PROVENTIL) (2.5 MG/3ML) 0.083% nebulizer solution Take 2.5 mg by nebulization every 6  (six) hours as needed for wheezing., Until Discontinued, Historical Med    budesonide-formoterol (SYMBICORT) 160-4.5 MCG/ACT inhaler Inhale 2 puffs into the lungs 2 (two) times daily. , Starting 12/13/2014, Until Discontinued, Historical Med    collagenase (SANTYL) ointment Apply 1 application topically daily., Until Discontinued, Historical Med    diltiazem (CARDIZEM CD) 180 MG 24 hr capsule Take 1 capsule (180 mg total) by mouth daily., Starting 03/01/2016, Until Discontinued, Normal    furosemide (LASIX) 20 MG tablet Take 20 mg by mouth daily., Until Discontinued, Historical Med    glucose blood (ACCU-CHEK AVIVA PLUS) test strip Check sugar 1 to 2 times a day. Dx: E11.9, Normal    lisinopril (PRINIVIL,ZESTRIL) 40 MG tablet Take 1 tablet (40 mg total) by mouth daily., Starting 09/02/2015, Until Discontinued, Normal    magic mouthwash SOLN Take 5 mLs by mouth 3 (three) times daily as needed for mouth pain., Starting 04/04/2016, Until Discontinued, Print    metFORMIN (GLUCOPHAGE) 500 MG tablet Take 1 tablet (500 mg total) by mouth daily., Starting 03/01/2016, Until Discontinued, Normal    metoprolol (LOPRESSOR) 100 MG tablet Take 1 tablet (100 mg total) by mouth 2 (two) times daily., Starting 02/28/2016, Until Discontinued, Normal    ofloxacin (OCUFLOX) 0.3 % ophthalmic solution Place 5 drops into both ears as needed., Starting 09/08/2015, Until Discontinued, Historical Med    OXYGEN-HELIUM IN Inhale 2 L into the lungs continuous. 2 liters of oxygen., Until Discontinued, Historical Med    potassium chloride SA (K-DUR,KLOR-CON) 20 MEQ tablet Take 20 mEq by mouth 2 (two) times daily. , Starting 03/26/2013, Until Discontinued, Historical Med    pravastatin (PRAVACHOL) 80 MG tablet Take 80 mg by mouth daily., Until Discontinued, Historical Med    tiotropium (SPIRIVA) 18 MCG inhalation capsule Place 18 mcg into inhaler and inhale daily. , Starting 12/21/2013, Until Discontinued, Historical Med     warfarin (COUMADIN) 3 MG tablet Take 3 mg by mouth See admin instructions. Take 3mg  daily on Tuesday, Wednesday, Friday, Saturday, Sunday, Until Discontinued, Historical Med        Today   VITAL SIGNS:  Blood pressure 113/57, pulse 94, temperature 97.9 F (36.6 C), temperature source Oral, resp. rate 18, height 5\' 2"  (1.575 m), weight 71.5 kg (157 lb 10.1 oz), SpO2 93 %.  I/O:   Intake/Output Summary (Last 24 hours) at 05/17/16 1406 Last data filed at 05/16/16 1420  Gross per 24 hour  Intake      0 ml  Output      0 ml  Net      0 ml    PHYSICAL EXAMINATION:  Physical Exam  GENERAL:  70 y.o.-year-old patient lying in the bed with no acute distress.  LUNGS: Normal breath sounds bilaterally, no wheezing, rales,rhonchi or crepitation. No use of accessory muscles of respiration.  CARDIOVASCULAR: S1, S2 normal. No murmurs, rubs, or gallops.  ABDOMEN: Soft, non-tender, non-distended. Bowel sounds present. No organomegaly or mass.  NEUROLOGIC: Moves all 4 extremities. PSYCHIATRIC: The patient is alert and oriented x 3.  SKIN: No obvious rash, lesion, or ulcer.   DATA  REVIEW:   CBC  Recent Labs Lab 05/14/16 0313  WBC 17.4*  HGB 11.3*  HCT 33.9*  PLT 223    Chemistries   Recent Labs Lab 05/14/16 0313  NA 132*  K 5.4*  CL 95*  CO2 33*  GLUCOSE 230*  BUN 46*  CREATININE 0.97  CALCIUM 8.2*  MG 2.6*    Cardiac Enzymes No results for input(s): TROPONINI in the last 168 hours.  Microbiology Results  Results for orders placed or performed during the hospital encounter of 05/10/16  Blood culture (routine x 2)     Status: None   Collection Time: 05/10/16  7:21 AM  Result Value Ref Range Status   Specimen Description BLOOD AJ  Final   Special Requests   Final    BOTTLES DRAWN AEROBIC AND ANAEROBIC AER ANA   Culture NO GROWTH 5 DAYS  Final   Report Status 05/15/2016 FINAL  Final  Blood culture (routine x 2)     Status: None   Collection Time:  05/10/16  7:21 AM  Result Value Ref Range Status   Specimen Description BLOOD RIGHT AC  Final   Special Requests BOTTLES DRAWN AEROBIC AND ANAEROBIC  Final   Culture NO GROWTH 5 DAYS  Final   Report Status 05/15/2016 FINAL  Final  Culture, sputum-assessment     Status: None   Collection Time: 05/10/16  7:51 AM  Result Value Ref Range Status   Specimen Description EXPECTORATED SPUTUM  Final   Special Requests NONE  Final   Sputum evaluation THIS SPECIMEN IS ACCEPTABLE FOR SPUTUM CULTURE  Final   Report Status 05/10/2016 FINAL  Final  Urine culture     Status: Abnormal   Collection Time: 05/10/16  7:51 AM  Result Value Ref Range Status   Specimen Description URINE, RANDOM  Final   Special Requests NONE  Final   Culture MULTIPLE SPECIES PRESENT, SUGGEST RECOLLECTION (A)  Final   Report Status 05/11/2016 FINAL  Final  Culture, respiratory (NON-Expectorated)     Status: None   Collection Time: 05/10/16  7:51 AM  Result Value Ref Range Status   Specimen Description EXPECTORATED SPUTUM  Final   Special Requests NONE Reflexed from [id number]  Final   Gram Stain   Final    ABUNDANT WBC PRESENT,BOTH PMN AND MONONUCLEAR ABUNDANT GRAM POSITIVE COCCI IN PAIRS IN CLUSTERS ABUNDANT GRAM VARIABLE ROD    Culture ABUNDANT PSEUDOMONAS AERUGINOSA  Final   Report Status 05/13/2016 FINAL  Final   Organism ID, Bacteria PSEUDOMONAS AERUGINOSA  Final      Susceptibility   Pseudomonas aeruginosa - MIC*    CEFTAZIDIME <=1 SENSITIVE Sensitive     CIPROFLOXACIN <=0.25 SENSITIVE Sensitive     GENTAMICIN <=1 SENSITIVE Sensitive     IMIPENEM <=0.25 SENSITIVE Sensitive     PIP/TAZO <=4 SENSITIVE Sensitive     CEFEPIME <=1 SENSITIVE Sensitive     * ABUNDANT PSEUDOMONAS AERUGINOSA  MRSA PCR Screening     Status: None   Collection Time: 05/10/16  2:00 PM  Result Value Ref Range Status   MRSA by PCR NEGATIVE NEGATIVE Final    Comment:        The GeneXpert MRSA Assay (FDA approved for NASAL  specimens only), is one component of a comprehensive MRSA colonization surveillance program. It is not intended to diagnose MRSA infection nor to guide or monitor treatment for MRSA infections.     RADIOLOGY:  No results found.  Follow up with PCP  in 1 week.  Management plans discussed with the patient, family and they are in agreement.  CODE STATUS:  Code Status History    Date Active Date Inactive Code Status Order ID Comments User Context   05/11/2016  7:36 PM 05/16/2016  6:02 PM DNR [id number]  Shane Crutch, MD Inpatient   05/10/2016  1:30 PM 05/11/2016  7:36 PM Full Code [id number]  Adrian Saran, MD Inpatient   07/17/2015  4:50 PM 07/21/2015  6:19 PM Full Code [id number]  Adrian Saran, MD Inpatient    Questions for Most Recent Historical Code Status (Order [id number])    Question Answer Comment   In the event of cardiac or respiratory ARREST Do not call a "code blue"    In the event of cardiac or respiratory ARREST Do not perform Intubation, CPR, defibrillation or ACLS    In the event of cardiac or respiratory ARREST Use medication by any route, position, wound care, and other measures to relive pain and suffering. May use oxygen, suction and manual treatment of airway obstruction as needed for comfort.       TOTAL TIME TAKING CARE OF THIS PATIENT ON DAY OF DISCHARGE: more than 30 minutes.   Milagros Loll R M.D on 05/17/2016 at 2:06 PM  Between 7am to 6pm - Pager - [phone]  After 6pm go to [email or web address] - password [email or web address] [facility]  [place]  [facility]  Office  [phone]  CC: Primary care physician; Fidel Levy, MD  Note: This dictation was prepared with Dragon dictation along with smaller phrase technology. Any transcriptional errors that result from this process are unintentional.

## 2016-05-21 ENCOUNTER — Telehealth: Payer: Self-pay | Admitting: Family Medicine

## 2016-05-21 ENCOUNTER — Other Ambulatory Visit: Payer: Self-pay | Admitting: Family Medicine

## 2016-05-21 DIAGNOSIS — R059 Cough, unspecified: Secondary | ICD-10-CM

## 2016-05-21 DIAGNOSIS — R05 Cough: Secondary | ICD-10-CM

## 2016-05-21 NOTE — Telephone Encounter (Signed)
Sarah with Hospice said pt was released from hospital and was given prednisone and cipro.  She is stiff coughing up a lot of stuff.  Her cipro ran out and she only has 3 day of prednisone.  She asked if Dr. Juanetta GoslingHawkins wanted to call something else in.  Her call back number is [phone]

## 2016-05-21 NOTE — Telephone Encounter (Signed)
Send in Prescription for Levaquin, 750 mg., 1 daily for 7 days.  She needs to be seen if she is not improving.jh

## 2016-05-22 ENCOUNTER — Ambulatory Visit: Payer: Medicare Other | Admitting: Nurse Practitioner

## 2016-05-22 MED ORDER — LEVOFLOXACIN 750 MG PO TABS: 750.0000 mg | ORAL_TABLET | Freq: Every day | ORAL | Status: AC

## 2016-05-22 NOTE — Telephone Encounter (Signed)
Done and Sarah notitified.[place]

## 2016-05-25 ENCOUNTER — Telehealth: Payer: Self-pay | Admitting: *Deleted

## 2016-05-25 NOTE — Telephone Encounter (Signed)
Tammy with [facility] called at [phone]. Patient passed away at 7:40 pm.

## 2016-05-28 NOTE — Telephone Encounter (Signed)
Noted-jh 

## 2016-05-29 ENCOUNTER — Inpatient Hospital Stay: Payer: Medicare Other | Admitting: Family Medicine

## 2016-06-12 DEATH — deceased

## 2016-08-09 ENCOUNTER — Ambulatory Visit: Payer: Medicare Other | Admitting: Family Medicine

## 2017-03-11 IMAGING — DX DG CHEST 1V
1 series · 1 of 1 positions shown · non-contrast
Comparison: 05/10/2016

CLINICAL DATA: Dyspnea

EXAM:
CHEST 1 VIEW

[chest ap]
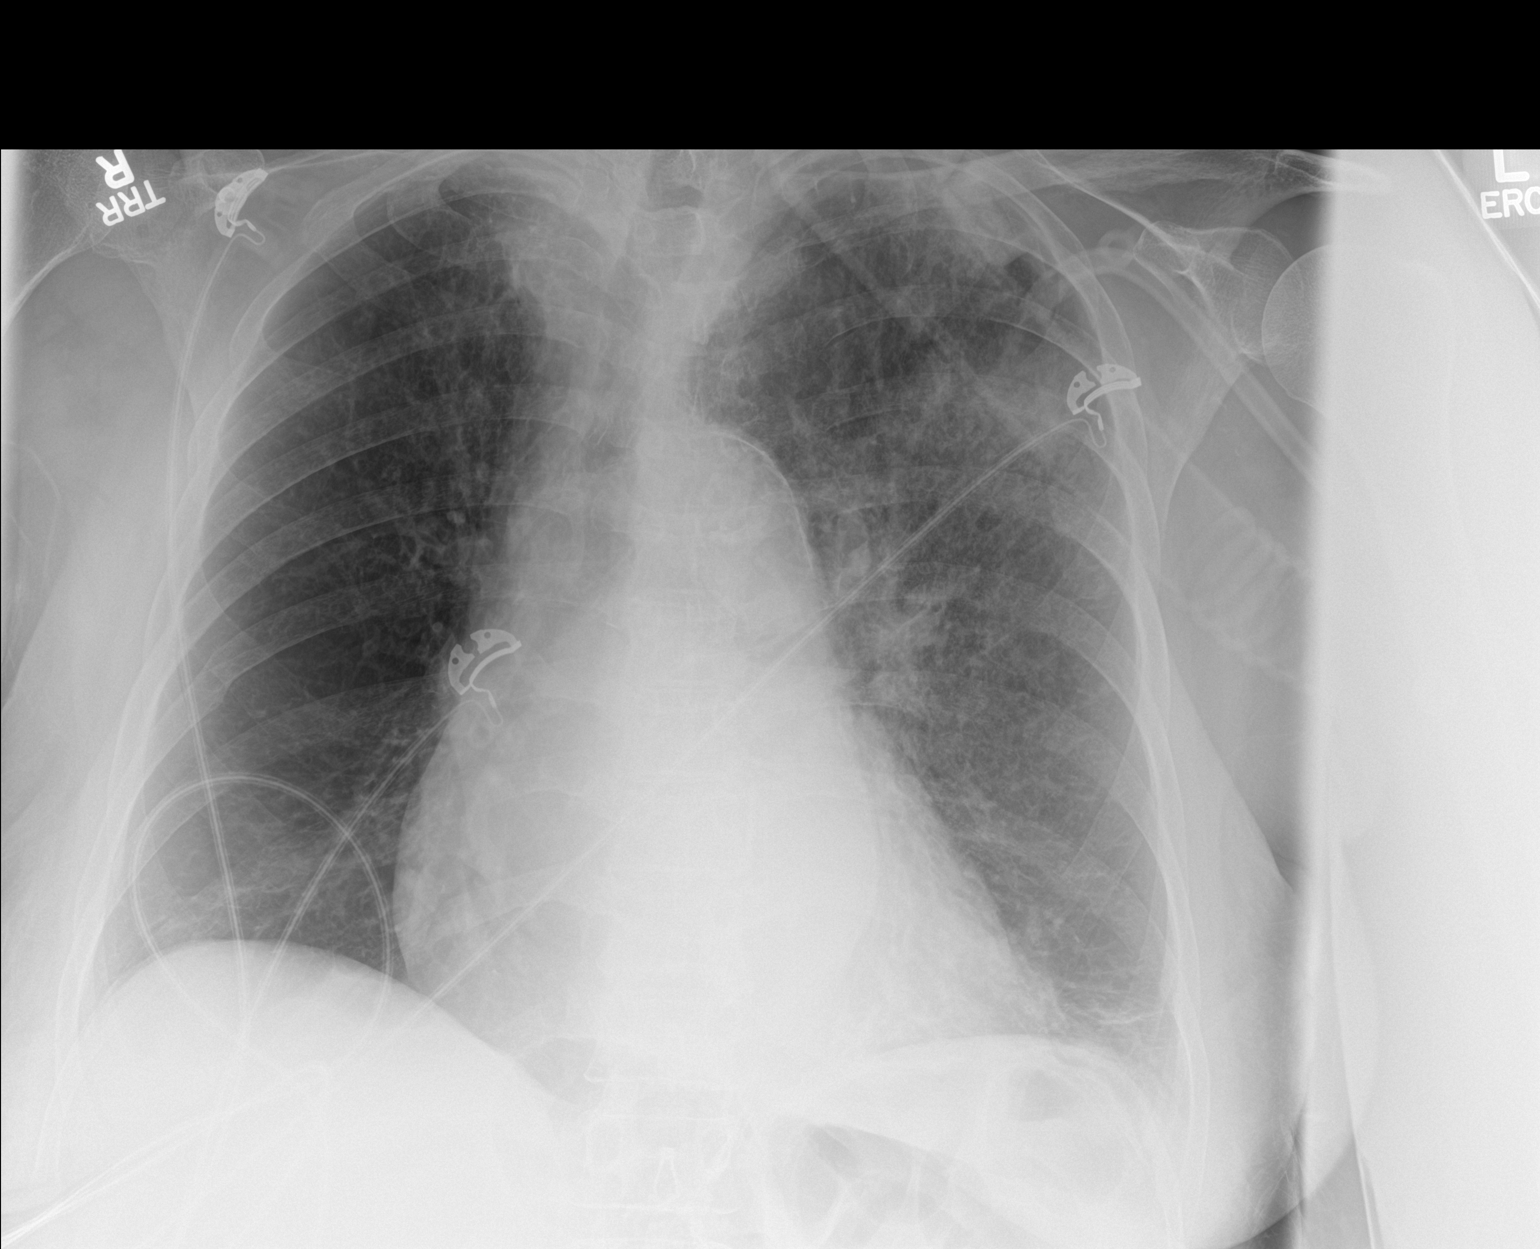

[1 of 1 positions shown; findings below may reference images not displayed]

FINDINGS: Cardiac shadow is stable. Patchy infiltrates are again noted in the
left upper lobe. The right lung remains clear. No other focal
abnormality is noted. Aortic calcifications are again seen.
IMPRESSION: Stable left upper lobe infiltrate.

Aortic atherosclerosis.

## 2017-03-13 IMAGING — DX DG CHEST 1V PORT
1 series · 1 of 1 positions shown · non-contrast
Comparison: Portable chest x-ray May 12, 2016

CLINICAL DATA: Acute respiratory for a year, shortness of breath,
history of coronary artery stent, COPD, former smoker.

EXAM:
PORTABLE CHEST 1 VIEW

[chest ap]
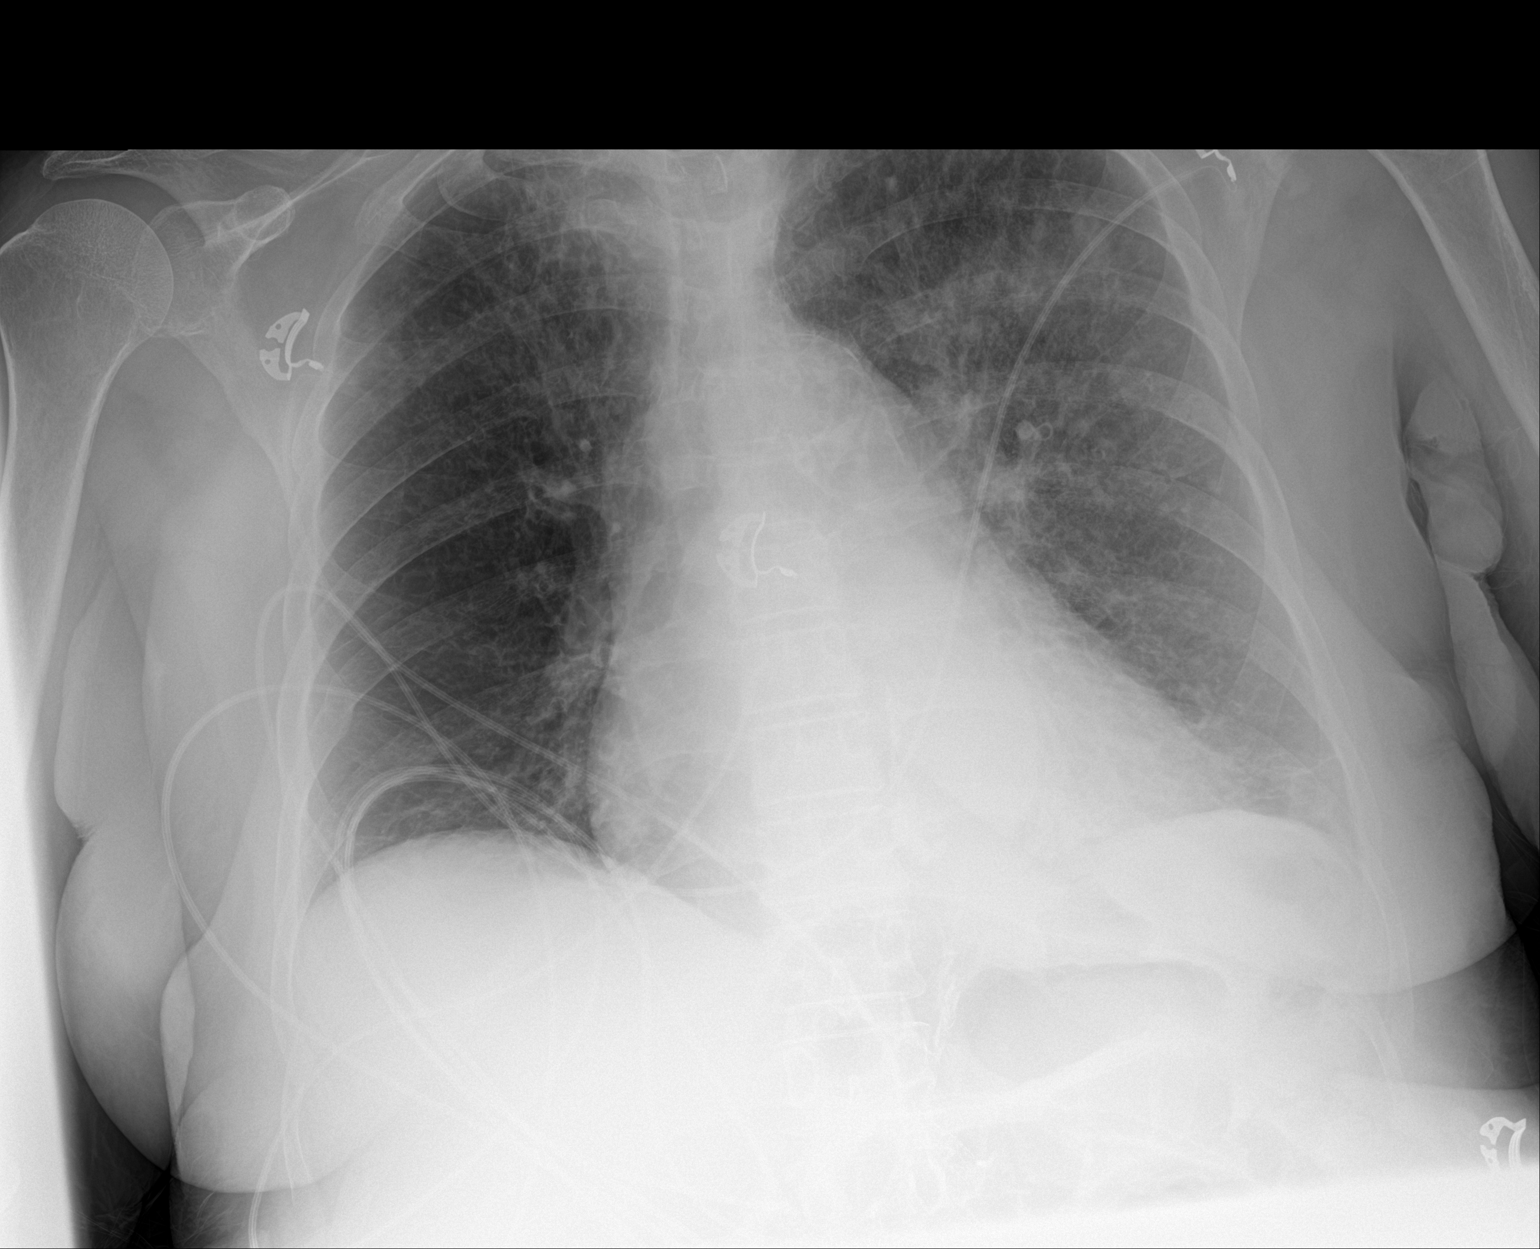

[1 of 1 positions shown; findings below may reference images not displayed]

FINDINGS: The lungs are well-expanded. The interstitial markings are increased
bilaterally greatest on the left. An area of subtle confluence in
the upper lobe is present. The cardiac silhouette is mildly
enlarged. The pulmonary vascularity is not engorged. There is no
pleural effusion or pneumothorax. There is old deformity of the
posterior lateral right eighth rib. There is calcification in the
wall of the aortic arch.
IMPRESSION: COPD. Mild pulmonary interstitial edema or pneumonia greatest on the
left with an area of confluent airspace opacity in the left upper
lobe. Overall there has not been significant interval change since
yesterday's study.

Aortic atherosclerosis.
